# Patient Record
Sex: Male | Born: 1975 | Race: White | Hispanic: No | Marital: Married | Smoking: Never smoker
Health system: Southern US, Community
[De-identification: ages and names within clinical notes are randomized; demographics above are authoritative.]

## PROBLEM LIST (undated history)

## (undated) DIAGNOSIS — B370 Candidal stomatitis: Secondary | ICD-10-CM

## (undated) DIAGNOSIS — M47819 Spondylosis without myelopathy or radiculopathy, site unspecified: Secondary | ICD-10-CM

## (undated) DIAGNOSIS — H8110 Benign paroxysmal vertigo, unspecified ear: Secondary | ICD-10-CM

## (undated) DIAGNOSIS — M549 Dorsalgia, unspecified: Secondary | ICD-10-CM

## (undated) DIAGNOSIS — F418 Other specified anxiety disorders: Secondary | ICD-10-CM

## (undated) DIAGNOSIS — E538 Deficiency of other specified B group vitamins: Secondary | ICD-10-CM

## (undated) DIAGNOSIS — K589 Irritable bowel syndrome without diarrhea: Secondary | ICD-10-CM

## (undated) DIAGNOSIS — E669 Obesity, unspecified: Secondary | ICD-10-CM

## (undated) DIAGNOSIS — I10 Essential (primary) hypertension: Secondary | ICD-10-CM

## (undated) DIAGNOSIS — F1020 Alcohol dependence, uncomplicated: Secondary | ICD-10-CM

## (undated) DIAGNOSIS — F329 Major depressive disorder, single episode, unspecified: Secondary | ICD-10-CM

## (undated) DIAGNOSIS — K219 Gastro-esophageal reflux disease without esophagitis: Secondary | ICD-10-CM

## (undated) DIAGNOSIS — K449 Diaphragmatic hernia without obstruction or gangrene: Secondary | ICD-10-CM

## (undated) DIAGNOSIS — IMO0002 Reserved for concepts with insufficient information to code with codable children: Secondary | ICD-10-CM

## (undated) DIAGNOSIS — G8929 Other chronic pain: Secondary | ICD-10-CM

## (undated) DIAGNOSIS — K2270 Barrett's esophagus without dysplasia: Secondary | ICD-10-CM

## (undated) DIAGNOSIS — R161 Splenomegaly, not elsewhere classified: Secondary | ICD-10-CM

## (undated) DIAGNOSIS — R45851 Suicidal ideations: Secondary | ICD-10-CM

## (undated) DIAGNOSIS — F419 Anxiety disorder, unspecified: Secondary | ICD-10-CM

## (undated) DIAGNOSIS — K760 Fatty (change of) liver, not elsewhere classified: Secondary | ICD-10-CM

## (undated) HISTORY — DX: Benign paroxysmal vertigo, unspecified ear: H81.10

## (undated) HISTORY — DX: Fatty (change of) liver, not elsewhere classified: K76.0

## (undated) HISTORY — DX: Obesity, unspecified: E66.9

## (undated) HISTORY — DX: Other chronic pain: G89.29

## (undated) HISTORY — DX: Dorsalgia, unspecified: M54.9

## (undated) HISTORY — PX: UPPER GASTROINTESTINAL ENDOSCOPY: SHX188

## (undated) HISTORY — PX: TYMPANOSTOMY TUBE PLACEMENT: SHX32

## (undated) HISTORY — DX: Spondylosis without myelopathy or radiculopathy, site unspecified: M47.819

## (undated) HISTORY — DX: Reserved for concepts with insufficient information to code with codable children: IMO0002

## (undated) HISTORY — DX: Suicidal ideations: R45.851

## (undated) HISTORY — DX: Alcohol dependence, uncomplicated: F10.20

## (undated) HISTORY — DX: Major depressive disorder, single episode, unspecified: F32.9

## (undated) HISTORY — DX: Deficiency of other specified B group vitamins: E53.8

## (undated) HISTORY — DX: Barrett's esophagus without dysplasia: K22.70

## (undated) HISTORY — DX: Candidal stomatitis: B37.0

## (undated) HISTORY — DX: Essential (primary) hypertension: I10

## (undated) HISTORY — DX: Splenomegaly, not elsewhere classified: R16.1

## (undated) HISTORY — DX: Diaphragmatic hernia without obstruction or gangrene: K44.9

## (undated) HISTORY — DX: Irritable bowel syndrome, unspecified: K58.9

## (undated) HISTORY — DX: Anxiety disorder, unspecified: F41.9

## (undated) HISTORY — DX: Gastro-esophageal reflux disease without esophagitis: K21.9

---

## 1998-03-28 ENCOUNTER — Emergency Department (HOSPITAL_COMMUNITY): Admission: EM | Admit: 1998-03-28 | Discharge: 1998-03-28 | Payer: Self-pay | Admitting: Emergency Medicine

## 2007-03-25 ENCOUNTER — Emergency Department (HOSPITAL_COMMUNITY): Admission: EM | Admit: 2007-03-25 | Discharge: 2007-03-25 | Payer: Self-pay | Admitting: Emergency Medicine

## 2007-03-28 ENCOUNTER — Ambulatory Visit: Payer: Self-pay | Admitting: Cardiovascular Disease

## 2007-04-10 ENCOUNTER — Ambulatory Visit: Payer: Self-pay

## 2007-04-10 ENCOUNTER — Encounter: Payer: Self-pay | Admitting: Cardiovascular Disease

## 2007-04-12 ENCOUNTER — Ambulatory Visit: Payer: Self-pay | Admitting: Gastroenterology

## 2007-04-12 LAB — CONVERTED CEMR LAB
Amylase: 41 units/L (ref 27–131)
BUN: 9 mg/dL (ref 6–23)
Basophils Relative: 0.6 % (ref 0.0–1.0)
Chloride: 104 meq/L (ref 96–112)
Eosinophils Relative: 5.5 % — ABNORMAL HIGH (ref 0.0–5.0)
GFR calc Af Amer: 147 mL/min
Glucose, Bld: 100 mg/dL — ABNORMAL HIGH (ref 70–99)
Hemoglobin: 15.4 g/dL (ref 13.0–17.0)
Lipase: 22 units/L (ref 11.0–59.0)
Lymphocytes Relative: 27.3 % (ref 12.0–46.0)
MCV: 82.6 fL (ref 78.0–100.0)
Monocytes Absolute: 0.3 10*3/uL (ref 0.2–0.7)
Monocytes Relative: 5.5 % (ref 3.0–11.0)
Neutro Abs: 4 10*3/uL (ref 1.4–7.7)
Neutrophils Relative %: 61.1 % (ref 43.0–77.0)
RBC: 5.74 M/uL (ref 4.22–5.81)
Sodium: 142 meq/L (ref 135–145)
Total Protein: 7.1 g/dL (ref 6.0–8.3)

## 2007-04-16 ENCOUNTER — Ambulatory Visit (HOSPITAL_COMMUNITY): Admission: RE | Admit: 2007-04-16 | Discharge: 2007-04-16 | Payer: Self-pay | Admitting: Gastroenterology

## 2007-04-18 ENCOUNTER — Encounter: Payer: Self-pay | Admitting: Gastroenterology

## 2007-04-18 ENCOUNTER — Ambulatory Visit: Payer: Self-pay | Admitting: Gastroenterology

## 2007-04-18 LAB — CONVERTED CEMR LAB
HCV Ab: NEGATIVE
Hepatitis B Surface Ag: NEGATIVE

## 2007-04-26 ENCOUNTER — Ambulatory Visit: Payer: Self-pay | Admitting: Family Medicine

## 2007-04-26 DIAGNOSIS — I10 Essential (primary) hypertension: Secondary | ICD-10-CM

## 2007-04-26 DIAGNOSIS — F411 Generalized anxiety disorder: Secondary | ICD-10-CM

## 2007-05-03 ENCOUNTER — Ambulatory Visit: Payer: Self-pay | Admitting: Gastroenterology

## 2007-05-03 LAB — CONVERTED CEMR LAB
ALT: 44 units/L (ref 0–53)
AST: 54 units/L — ABNORMAL HIGH (ref 0–37)
Albumin: 3.9 g/dL (ref 3.5–5.2)
Alkaline Phosphatase: 76 units/L (ref 39–117)
Bilirubin, Direct: 0.2 mg/dL (ref 0.0–0.3)
Ferritin: 33.9 ng/mL (ref 22.0–322.0)
Folate: 4 ng/mL
INR: 1 (ref 0.8–1.0)
Iron: 51 ug/dL (ref 42–165)
Prothrombin Time: 12.4 s (ref 10.9–13.3)
Saturation Ratios: 10.6 % — ABNORMAL LOW (ref 20.0–50.0)
Total Bilirubin: 0.9 mg/dL (ref 0.3–1.2)
Total Protein: 7.2 g/dL (ref 6.0–8.3)
Transferrin: 343.3 mg/dL (ref >=212.0)
Vitamin B-12: 247 pg/mL (ref 211–911)

## 2007-05-10 ENCOUNTER — Ambulatory Visit: Payer: Self-pay | Admitting: Family Medicine

## 2007-05-21 ENCOUNTER — Ambulatory Visit: Payer: Self-pay | Admitting: Family Medicine

## 2007-05-21 DIAGNOSIS — H8110 Benign paroxysmal vertigo, unspecified ear: Secondary | ICD-10-CM | POA: Insufficient documentation

## 2007-05-22 ENCOUNTER — Ambulatory Visit: Payer: Self-pay | Admitting: Gastroenterology

## 2007-06-19 ENCOUNTER — Ambulatory Visit: Payer: Self-pay | Admitting: Family Medicine

## 2007-07-02 ENCOUNTER — Ambulatory Visit: Payer: Self-pay | Admitting: Gastroenterology

## 2007-07-02 LAB — CONVERTED CEMR LAB
ALT: 117 units/L — ABNORMAL HIGH (ref 0–53)
Albumin: 3.7 g/dL (ref 3.5–5.2)
Alkaline Phosphatase: 73 units/L (ref 39–117)
Total Protein: 7 g/dL (ref 6.0–8.3)

## 2007-07-03 ENCOUNTER — Ambulatory Visit: Payer: Self-pay | Admitting: Gastroenterology

## 2007-07-03 DIAGNOSIS — K589 Irritable bowel syndrome without diarrhea: Secondary | ICD-10-CM | POA: Insufficient documentation

## 2007-07-03 DIAGNOSIS — R1013 Epigastric pain: Secondary | ICD-10-CM

## 2007-07-03 DIAGNOSIS — K760 Fatty (change of) liver, not elsewhere classified: Secondary | ICD-10-CM

## 2007-07-03 DIAGNOSIS — K219 Gastro-esophageal reflux disease without esophagitis: Secondary | ICD-10-CM

## 2007-07-03 LAB — CONVERTED CEMR LAB
Ferritin: 64 ng/mL (ref 22.0–322.0)
Folate: 9.8 ng/mL
Iron: 101 ug/dL (ref 42–165)
Sed Rate: 17 mm/hr — ABNORMAL HIGH (ref 0–16)

## 2007-07-05 ENCOUNTER — Telehealth: Payer: Self-pay | Admitting: Gastroenterology

## 2007-07-30 ENCOUNTER — Telehealth: Payer: Self-pay | Admitting: Gastroenterology

## 2007-07-31 ENCOUNTER — Ambulatory Visit: Payer: Self-pay | Admitting: Gastroenterology

## 2007-07-31 DIAGNOSIS — E669 Obesity, unspecified: Secondary | ICD-10-CM | POA: Insufficient documentation

## 2007-09-17 ENCOUNTER — Ambulatory Visit: Payer: Self-pay | Admitting: Gastroenterology

## 2007-09-17 LAB — CONVERTED CEMR LAB
ALT: 30 units/L (ref 0–53)
Bilirubin, Direct: 0.1 mg/dL (ref 0.0–0.3)

## 2007-09-18 ENCOUNTER — Ambulatory Visit: Payer: Self-pay | Admitting: Gastroenterology

## 2007-09-18 DIAGNOSIS — F329 Major depressive disorder, single episode, unspecified: Secondary | ICD-10-CM | POA: Insufficient documentation

## 2007-09-18 DIAGNOSIS — F3289 Other specified depressive episodes: Secondary | ICD-10-CM | POA: Insufficient documentation

## 2007-10-05 ENCOUNTER — Ambulatory Visit: Payer: Self-pay | Admitting: Psychology

## 2007-10-09 ENCOUNTER — Emergency Department (HOSPITAL_COMMUNITY): Admission: EM | Admit: 2007-10-09 | Discharge: 2007-10-10 | Payer: Self-pay | Admitting: Emergency Medicine

## 2007-10-25 ENCOUNTER — Ambulatory Visit: Payer: Self-pay | Admitting: Psychology

## 2007-10-30 ENCOUNTER — Ambulatory Visit: Payer: Self-pay | Admitting: Psychology

## 2007-11-01 ENCOUNTER — Ambulatory Visit: Payer: Self-pay | Admitting: Gastroenterology

## 2007-11-06 ENCOUNTER — Ambulatory Visit: Payer: Self-pay | Admitting: Psychology

## 2007-11-06 LAB — CONVERTED CEMR LAB
ALT: 32 units/L (ref 0–53)
Albumin: 4.1 g/dL (ref 3.5–5.2)
Alkaline Phosphatase: 65 units/L (ref 39–117)
Total Protein: 7.4 g/dL (ref 6.0–8.3)

## 2007-11-10 ENCOUNTER — Emergency Department (HOSPITAL_COMMUNITY): Admission: EM | Admit: 2007-11-10 | Discharge: 2007-11-10 | Payer: Self-pay | Admitting: Emergency Medicine

## 2007-11-16 ENCOUNTER — Ambulatory Visit: Payer: Self-pay | Admitting: Psychiatry

## 2007-11-16 ENCOUNTER — Inpatient Hospital Stay (HOSPITAL_COMMUNITY): Admission: AD | Admit: 2007-11-16 | Discharge: 2007-11-19 | Payer: Self-pay | Admitting: Psychiatry

## 2007-11-16 ENCOUNTER — Emergency Department (HOSPITAL_COMMUNITY): Admission: EM | Admit: 2007-11-16 | Discharge: 2007-11-16 | Payer: Self-pay | Admitting: Emergency Medicine

## 2007-11-20 ENCOUNTER — Ambulatory Visit: Payer: Self-pay | Admitting: Psychology

## 2007-11-30 ENCOUNTER — Ambulatory Visit: Payer: Self-pay | Admitting: Psychology

## 2007-12-04 ENCOUNTER — Ambulatory Visit: Payer: Self-pay | Admitting: Psychology

## 2007-12-05 ENCOUNTER — Encounter (INDEPENDENT_AMBULATORY_CARE_PROVIDER_SITE_OTHER): Payer: Self-pay | Admitting: *Deleted

## 2007-12-12 ENCOUNTER — Telehealth: Payer: Self-pay | Admitting: Family Medicine

## 2007-12-12 ENCOUNTER — Ambulatory Visit: Payer: Self-pay | Admitting: Family Medicine

## 2007-12-18 ENCOUNTER — Ambulatory Visit: Payer: Self-pay | Admitting: Licensed Clinical Social Worker

## 2008-01-08 ENCOUNTER — Ambulatory Visit: Payer: Self-pay | Admitting: Gastroenterology

## 2008-01-10 ENCOUNTER — Telehealth: Payer: Self-pay | Admitting: Gastroenterology

## 2008-01-17 ENCOUNTER — Ambulatory Visit: Payer: Self-pay | Admitting: Gastroenterology

## 2008-01-29 ENCOUNTER — Ambulatory Visit: Payer: Self-pay | Admitting: Psychology

## 2008-02-04 ENCOUNTER — Telehealth: Payer: Self-pay | Admitting: Family Medicine

## 2008-02-12 ENCOUNTER — Ambulatory Visit: Payer: Self-pay | Admitting: Psychology

## 2008-02-26 ENCOUNTER — Ambulatory Visit: Payer: Self-pay | Admitting: Psychology

## 2008-03-01 DIAGNOSIS — J45909 Unspecified asthma, uncomplicated: Secondary | ICD-10-CM | POA: Insufficient documentation

## 2008-03-04 ENCOUNTER — Ambulatory Visit: Payer: Self-pay | Admitting: Family Medicine

## 2008-03-05 ENCOUNTER — Telehealth: Payer: Self-pay | Admitting: Gastroenterology

## 2008-03-05 ENCOUNTER — Ambulatory Visit: Payer: Self-pay | Admitting: Gastroenterology

## 2008-03-05 LAB — CONVERTED CEMR LAB
Albumin: 3.8 g/dL (ref 3.5–5.2)
BUN: 13 mg/dL (ref 6–23)
Bilirubin, Direct: 0.1 mg/dL (ref 0.0–0.3)
CO2: 29 meq/L (ref 19–32)
Chloride: 101 meq/L (ref 96–112)
Creatinine, Ser: 0.9 mg/dL (ref 0.4–1.5)
GFR calc non Af Amer: 105 mL/min
Potassium: 4.5 meq/L (ref 3.5–5.1)
Sodium: 140 meq/L (ref 135–145)
Total Bilirubin: 0.8 mg/dL (ref 0.3–1.2)
Total Protein: 6.9 g/dL (ref 6.0–8.3)

## 2008-03-06 ENCOUNTER — Ambulatory Visit: Payer: Self-pay | Admitting: Gastroenterology

## 2008-03-06 DIAGNOSIS — F1011 Alcohol abuse, in remission: Secondary | ICD-10-CM

## 2008-03-11 ENCOUNTER — Ambulatory Visit: Payer: Self-pay | Admitting: Family Medicine

## 2008-03-11 ENCOUNTER — Ambulatory Visit: Payer: Self-pay | Admitting: Psychology

## 2008-03-11 ENCOUNTER — Encounter: Payer: Self-pay | Admitting: Family Medicine

## 2008-03-11 DIAGNOSIS — J45909 Unspecified asthma, uncomplicated: Secondary | ICD-10-CM | POA: Insufficient documentation

## 2008-03-25 ENCOUNTER — Ambulatory Visit: Payer: Self-pay | Admitting: Psychology

## 2008-04-08 ENCOUNTER — Ambulatory Visit: Payer: Self-pay | Admitting: Psychology

## 2008-04-22 ENCOUNTER — Encounter: Payer: Self-pay | Admitting: Gastroenterology

## 2008-04-22 ENCOUNTER — Telehealth: Payer: Self-pay | Admitting: Gastroenterology

## 2008-05-01 ENCOUNTER — Ambulatory Visit: Payer: Self-pay | Admitting: Gastroenterology

## 2008-05-08 ENCOUNTER — Ambulatory Visit: Payer: Self-pay | Admitting: Psychology

## 2008-05-16 ENCOUNTER — Encounter: Payer: Self-pay | Admitting: Gastroenterology

## 2008-05-22 ENCOUNTER — Ambulatory Visit: Payer: Self-pay | Admitting: Psychology

## 2008-06-05 ENCOUNTER — Ambulatory Visit: Payer: Self-pay | Admitting: Psychology

## 2008-06-19 ENCOUNTER — Ambulatory Visit: Payer: Self-pay | Admitting: Psychology

## 2008-07-03 ENCOUNTER — Ambulatory Visit: Payer: Self-pay | Admitting: Psychology

## 2008-07-31 ENCOUNTER — Ambulatory Visit: Payer: Self-pay | Admitting: Psychology

## 2008-08-14 ENCOUNTER — Ambulatory Visit: Payer: Self-pay | Admitting: Psychology

## 2008-08-16 DIAGNOSIS — M5137 Other intervertebral disc degeneration, lumbosacral region: Secondary | ICD-10-CM

## 2008-08-23 ENCOUNTER — Emergency Department (HOSPITAL_COMMUNITY): Admission: EM | Admit: 2008-08-23 | Discharge: 2008-08-23 | Payer: Self-pay | Admitting: Emergency Medicine

## 2008-08-27 ENCOUNTER — Telehealth: Payer: Self-pay | Admitting: Family Medicine

## 2008-08-27 ENCOUNTER — Ambulatory Visit: Payer: Self-pay | Admitting: Family Medicine

## 2008-08-27 ENCOUNTER — Emergency Department (HOSPITAL_COMMUNITY): Admission: EM | Admit: 2008-08-27 | Discharge: 2008-08-27 | Payer: Self-pay | Admitting: Emergency Medicine

## 2008-08-28 ENCOUNTER — Encounter: Admission: RE | Admit: 2008-08-28 | Discharge: 2008-08-28 | Payer: Self-pay | Admitting: Family Medicine

## 2008-09-11 ENCOUNTER — Ambulatory Visit: Payer: Self-pay | Admitting: Psychology

## 2008-10-06 ENCOUNTER — Ambulatory Visit: Payer: Self-pay | Admitting: Gastroenterology

## 2008-10-06 LAB — CONVERTED CEMR LAB
Albumin: 3.7 g/dL (ref 3.5–5.2)
Alkaline Phosphatase: 83 units/L (ref 39–117)
Total Bilirubin: 0.6 mg/dL (ref 0.3–1.2)
Total Protein: 7 g/dL (ref 6.0–8.3)

## 2008-10-07 ENCOUNTER — Ambulatory Visit: Payer: Self-pay | Admitting: Gastroenterology

## 2008-10-09 ENCOUNTER — Ambulatory Visit: Payer: Self-pay | Admitting: Psychology

## 2008-10-23 ENCOUNTER — Ambulatory Visit: Payer: Self-pay | Admitting: Psychology

## 2008-11-20 ENCOUNTER — Ambulatory Visit: Payer: Self-pay | Admitting: Psychology

## 2008-12-04 ENCOUNTER — Ambulatory Visit: Payer: Self-pay | Admitting: Psychology

## 2008-12-08 ENCOUNTER — Ambulatory Visit: Payer: Self-pay | Admitting: Gastroenterology

## 2008-12-08 DIAGNOSIS — IMO0002 Reserved for concepts with insufficient information to code with codable children: Secondary | ICD-10-CM

## 2009-01-01 ENCOUNTER — Ambulatory Visit: Payer: Self-pay | Admitting: Psychology

## 2009-01-13 ENCOUNTER — Telehealth: Payer: Self-pay | Admitting: Gastroenterology

## 2009-01-29 ENCOUNTER — Ambulatory Visit: Payer: Self-pay | Admitting: Psychology

## 2009-02-17 ENCOUNTER — Telehealth (INDEPENDENT_AMBULATORY_CARE_PROVIDER_SITE_OTHER): Payer: Self-pay | Admitting: *Deleted

## 2009-04-23 ENCOUNTER — Ambulatory Visit: Payer: Self-pay | Admitting: Gastroenterology

## 2009-04-24 LAB — CONVERTED CEMR LAB
Albumin: 4.3 g/dL (ref 3.5–5.2)
Bilirubin, Direct: 0.2 mg/dL (ref 0.0–0.3)
Total Bilirubin: 0.8 mg/dL (ref 0.3–1.2)

## 2009-05-07 ENCOUNTER — Emergency Department (HOSPITAL_COMMUNITY): Admission: EM | Admit: 2009-05-07 | Discharge: 2009-05-07 | Payer: Self-pay | Admitting: Emergency Medicine

## 2009-05-07 ENCOUNTER — Telehealth: Payer: Self-pay | Admitting: Family Medicine

## 2009-05-08 ENCOUNTER — Telehealth: Payer: Self-pay | Admitting: Family Medicine

## 2009-05-13 ENCOUNTER — Telehealth: Payer: Self-pay | Admitting: *Deleted

## 2009-05-21 ENCOUNTER — Ambulatory Visit: Payer: Self-pay | Admitting: Psychology

## 2009-05-27 ENCOUNTER — Encounter (INDEPENDENT_AMBULATORY_CARE_PROVIDER_SITE_OTHER): Payer: Self-pay | Admitting: *Deleted

## 2009-06-01 ENCOUNTER — Ambulatory Visit: Payer: Self-pay | Admitting: Psychology

## 2009-07-11 ENCOUNTER — Emergency Department (HOSPITAL_COMMUNITY): Admission: EM | Admit: 2009-07-11 | Discharge: 2009-07-11 | Payer: Self-pay | Admitting: Emergency Medicine

## 2009-08-24 ENCOUNTER — Telehealth (INDEPENDENT_AMBULATORY_CARE_PROVIDER_SITE_OTHER): Payer: Self-pay | Admitting: *Deleted

## 2009-09-11 ENCOUNTER — Ambulatory Visit: Payer: Self-pay | Admitting: Psychology

## 2009-09-15 ENCOUNTER — Ambulatory Visit: Payer: Self-pay | Admitting: Gastroenterology

## 2009-09-15 DIAGNOSIS — R609 Edema, unspecified: Secondary | ICD-10-CM

## 2009-09-16 DIAGNOSIS — E538 Deficiency of other specified B group vitamins: Secondary | ICD-10-CM

## 2009-09-16 DIAGNOSIS — R079 Chest pain, unspecified: Secondary | ICD-10-CM | POA: Insufficient documentation

## 2009-09-16 LAB — CONVERTED CEMR LAB
ALT: 17 units/L (ref 0–53)
AST: 24 units/L (ref 0–37)
Alkaline Phosphatase: 78 units/L (ref 39–117)
BUN: 14 mg/dL (ref 6–23)
Basophils Relative: 0.8 % (ref 0.0–3.0)
Bilirubin, Direct: 0.1 mg/dL (ref 0.0–0.3)
Chloride: 107 meq/L (ref 96–112)
Eosinophils Relative: 2.7 % (ref 0.0–5.0)
Lymphocytes Relative: 36.3 % (ref 12.0–46.0)
MCHC: 33.2 g/dL (ref 30.0–36.0)
Monocytes Absolute: 0.4 10*3/uL (ref 0.1–1.0)
Monocytes Relative: 5.7 % (ref 3.0–12.0)
Neutrophils Relative %: 54.5 % (ref 43.0–77.0)
Potassium: 4.5 meq/L (ref 3.5–5.1)
RBC: 4.83 M/uL (ref 4.22–5.81)
TSH: 4.77 microintl units/mL (ref 0.35–5.50)
Total Bilirubin: 0.3 mg/dL (ref 0.3–1.2)
Total Protein: 7.3 g/dL (ref 6.0–8.3)

## 2009-09-21 ENCOUNTER — Ambulatory Visit: Payer: Self-pay | Admitting: Gastroenterology

## 2009-09-28 ENCOUNTER — Ambulatory Visit: Payer: Self-pay | Admitting: Gastroenterology

## 2009-09-29 DIAGNOSIS — M549 Dorsalgia, unspecified: Secondary | ICD-10-CM | POA: Insufficient documentation

## 2009-09-30 ENCOUNTER — Ambulatory Visit: Payer: Self-pay | Admitting: Cardiovascular Disease

## 2009-10-06 ENCOUNTER — Ambulatory Visit: Payer: Self-pay | Admitting: Gastroenterology

## 2009-10-26 ENCOUNTER — Telehealth: Payer: Self-pay | Admitting: Gastroenterology

## 2009-11-05 ENCOUNTER — Ambulatory Visit: Payer: Self-pay | Admitting: Psychology

## 2009-11-09 ENCOUNTER — Ambulatory Visit: Payer: Self-pay | Admitting: Gastroenterology

## 2009-11-13 ENCOUNTER — Ambulatory Visit: Payer: Self-pay | Admitting: Gastroenterology

## 2009-11-13 LAB — CONVERTED CEMR LAB
Alkaline Phosphatase: 114 units/L (ref 39–117)
BUN: 8 mg/dL (ref 6–23)
Basophils Relative: 0.5 % (ref 0.0–3.0)
Bilirubin, Direct: 0.2 mg/dL (ref 0.0–0.3)
CO2: 29 meq/L (ref 19–32)
Calcium: 9.7 mg/dL (ref 8.4–10.5)
Chloride: 96 meq/L (ref 96–112)
Creatinine, Ser: 0.9 mg/dL (ref 0.4–1.5)
Eosinophils Absolute: 0.2 10*3/uL (ref 0.0–0.7)
Eosinophils Relative: 1.8 % (ref 0.0–5.0)
Folate: 5.2 ng/mL
GFR calc non Af Amer: 99.9 mL/min (ref >60)
Glucose, Bld: 87 mg/dL (ref 70–99)
HCT: 46.7 % (ref 39.0–52.0)
Lymphocytes Relative: 22.2 % (ref 12.0–46.0)
Monocytes Relative: 4.7 % (ref 3.0–12.0)
Neutrophils Relative %: 70.8 % (ref 43.0–77.0)
Platelets: 294 10*3/uL (ref 150.0–400.0)
RDW: 14.7 % — ABNORMAL HIGH (ref 11.5–14.6)
Saturation Ratios: 29.3 % (ref 20.0–50.0)
Sodium: 140 meq/L (ref 135–145)
Vitamin B-12: 498 pg/mL (ref 211–911)
WBC: 10 10*3/uL (ref 4.5–10.5)

## 2009-11-29 ENCOUNTER — Emergency Department (HOSPITAL_COMMUNITY)
Admission: EM | Admit: 2009-11-29 | Discharge: 2009-11-29 | Payer: Self-pay | Source: Home / Self Care | Admitting: Emergency Medicine

## 2010-04-01 NOTE — Assessment & Plan Note (Signed)
Summary: WEEKLY B12 SHOT...LSW.  Nurse Visit   Medication Administration  Injection # 1:    Medication: Vit B12 1000 mcg    Diagnosis: B12 DEFICIENCY (ICD-266.2)    Route: IM    Site: L deltoid    Exp Date: 05/2011    Lot #: [id number]    Mfr: [employer]    Comments: pt will return on 11/03/09 at 11 am for next injection.  Pt prefers injection versus nasal spray.    Patient tolerated injection without complications    Given by: Francee Piccolo CMA Duncan Dull) (October 06, 2009 10:43 AM)  Orders Added: 1)  Vit B12 1000 mcg [J3420]

## 2010-04-01 NOTE — Assessment & Plan Note (Signed)
Summary: WEEKLY B12 SHOT  Nurse Visit   Allergies: 1)  ! Codeine Sulfate (Codeine Sulfate)  Medication Administration  Injection # 1:    Medication: Vit B12 1000 mcg    Diagnosis: B12 DEFICIENCY (ICD-266.2)    Route: IM    Site: R deltoid    Exp Date: 07/2011    Lot #: [id number]    Mfr: [employer]    Comments: pt to schedule # 3 of 3 b 12 at front desk    Patient tolerated injection without complications    Given by: Chales Abrahams CMA Duncan Dull) (September 28, 2009 11:16 AM)  Orders Added: 1)  Vit B12 1000 mcg [J3420]

## 2010-04-01 NOTE — Assessment & Plan Note (Signed)
Summary: F/U APPT...LSW.    History of Present Illness Visit Type: Follow-up Visit Primary GI MD: Sheryn Bison MD Primary Provider: Kelle Darting, MD Requesting Provider: na  Chief Complaint: Follow up, Joseph Valenzuela to cardiologist for chest pain, Done EKG that was normal, but pt states he didnt do any labs which is a concern. Still has worsening chest pain and having more anxiety. pt needs refills on Clonzepam History of Present Illness:   Joseph Valenzuela continues to have severe anxiety related to financial difficulties. He denies current drug or alcohol abuse. He has continued pain in his right foot from a previous fracture this spring with associated edema. This is improved somewhat on Lasix 20 mg every other day. His acid reflux is well controlled Dexilant 60 mg a day. He has noncardiac chest pain related to his obesity, and had recent negative cardiac evaluation by Dr. Charlton Haws. He has severe fatty liver related to his obesity which apparently was initiated by Zyprexa use for his severe chronic anxiety and depression. He previously was being evaluated by Dr. Caralyn Guile, but financial concerns have limited this interaction. His primary care physician is Dr. Kelle Darting. Patient does have well controlled essential hypertension.   GI Review of Systems    Reports chest pain.      Denies abdominal pain, acid reflux, belching, bloating, dysphagia with liquids, dysphagia with solids, heartburn, loss of appetite, nausea, vomiting, vomiting blood, weight loss, and  weight gain.        Denies anal fissure, black tarry stools, change in bowel habit, constipation, diarrhea, diverticulosis, fecal incontinence, heme positive stool, hemorrhoids, irritable bowel syndrome, jaundice, light color stool, liver problems, rectal bleeding, and  rectal pain.    Current Medications (verified): 1)  Clonazepam 1 Mg  Tabs (Clonazepam) .Marland Kitchen.. 1 Tab Tid 2)  Dexilant 60 Mg Cpdr (Dexlansoprazole) .... Take One By Mouth Once  Daily 3)  Lisinopril-Hydrochlorothiazide 20-12.5 Mg Tabs (Lisinopril-Hydrochlorothiazide) .Marland Kitchen.. 1 Tab By Mouth Once Daily 4)  Furosemide 20 Mg Tabs (Furosemide) .Marland Kitchen.. 1 By Mouth Every Other Day 5)  Cyanocobalamin 1000 Mcg/ml Inj Soln (Cyanocobalamin) .Marland Kitchen.. 1 Cc Im Weekly X3 Then Monthly  Allergies (verified): 1)  ! 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  Regular exercise-no Patient has never smoked cigerettes  Past medical, surgical, family and social histories (including risk factors) reviewed for relevance to current acute and chronic problems.  Past Medical History: Reviewed history from 09/29/2009 and no changes required. CHEST PAIN UNSPECIFIED  normal dobutamine echo in 2009  Had SSCP with GI overtones HYPERTENSION  SUICIDAL IDEATION BACK PAIN, CHRONIC B12 DEFICIENCY  EDEMA HERNIATED DISC  DISC DISEASE, LUMBAR ASTHMA ALCOHOLISM  EXTRINSIC ASTHMA, UNSPECIFIED  VIRAL URI  SORE THROAT  DEPRESSION  OBESITY FATTY LIVER DISEASE  ABDOMINAL PAIN-EPIGASTRIC  GERD  IRRITABLE BOWEL SYNDROME  BENIGN POSITIONAL VERTIGO FAMILY HISTORY DEPRESSION FAMILY HISTORY OF ALCOHOLISM/ADDICTION  ANXIETY degenerative spinal disease  Past Surgical History: Reviewed history from 09/18/2007 and no changes required. Unremarkable  Family History: Reviewed history from 10/07/2008 and no changes required. Family History of Alcoholism/Addiction Family History Depression Family  History Hypertension Family History of Diabetes: Maternal Aunt, Mother Family History of Stomach Cancer: Grandmother Brain Cancer: Grandfather No FH of Colon Cancer:  Social History: Reviewed history from 09/29/2009 and no changes required. 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  Regular exercise-no Patient has never smoked cigerettes  Review of Systems       The patient complains of arthritis/joint pain, depression-new, shortness of breath, and sleeping problems.  The patient denies allergy/sinus, anemia, back pain, blood in urine, breast changes/lumps, change in vision, confusion, cough, coughing up blood, fainting, fatigue, fever, headaches-new, hearing problems, heart murmur, heart rhythm changes, itching, muscle pains/cramps, night sweats, nosebleeds, skin rash, sore throat, swelling of feet/legs, swollen lymph glands, thirst - excessive, urination - excessive, urination changes/pain, urine leakage, vision changes, and voice change.    Vital Signs:  Patient profile:   35 year old male Height:      70 inches Weight:      363 pounds BMI:     52.27 BSA:     2.69 Pulse rate:   100 / minute BP sitting:   130 / 88  (left arm)  Vitals Entered By: Merri Ray CMA Duncan Dull) (November 13, 2009 10:50 AM)  Physical Exam  General:  Well developed, well nourished, no acute distress.Morbidly obese with multiple tattoos obvious. Head:  Normocephalic and atraumatic. Eyes:  PERRLA, no icterus.exam deferred to patient's ophthalmologist.   Lungs:  Clear throughout to auscultation. Heart:  Regular rate and rhythm; no murmurs, rubs,  or bruits. Abdomen:  Soft, nontender and nondistended. No masses, hepatosplenomegaly or hernias noted. Normal bowel sounds.obese.  No significant organomegaly or ascites noted. Extremities:  No clubbing, cyanosis, edema or  deformities noted.There is trace pretibial edema, right ankle greater than left but no evidence of phlebitis. Neurologic:  Alert and  oriented x4;  grossly normal neurologically. Psych:  Alert and cooperative. Normal mood and affect.depressed affect.     Impression & Recommendations:  Problem # 1:  CHEST PAIN UNSPECIFIED (ICD-786.50) Assessment Unchanged Continue reflux maneuvers and daily Dexilant 60 mg through patient assistance program  Problem # 2:  HYPERTENSION (ICD-401.9) Assessment: Improved blood pressure today is 130/80, and he is to continue all other medications per Dr. Tawanna Cooler  Problem # 3:  EDEMA (ICD-782.3) Assessment: Improved we will check labs today and continue Lasix every other day as needed. The patient has not had orthopedic evaluation which may be needed for her continued problems with his right ankle.  Problem # 4:  ALCOHOLISM (ICD-303.90) Assessment: Improved  Problem # 5:  DEPRESSION (ICD-311) Assessment: Deteriorated The Patient cannot afford psychiatric referral, as not seen Dr. Dellia Cloud recently, and has refused any suggestions of antidepressant medication. I do not think he is suicidal at this time  Problem # 6:  OBESITY (ICD-278.00) Assessment: Deteriorated He Is not, not, a valid candidate for bariatric surgery.  Problem # 7:  FATTY LIVER DISEASE (ICD-571.8) Assessment: Unchanged Repeat labs and liver function testing consider initiation of metformin therapy. He previously was on Actos which was discontinued per black box warning. Orders: TLB-CBC Platelet - w/Differential (85025-CBCD) TLB-BMP (Basic Metabolic Panel-BMET) (80048-METABOL) TLB-Hepatic/Liver Function Pnl (80076-HEPATIC) TLB-TSH (Thyroid Stimulating Hormone) (84443-TSH) TLB-B12, Serum-Total ONLY (16109-U04) TLB-Ferritin (82728-FER) TLB-Folic Acid (Folate) (82746-FOL) TLB-IBC Pnl (Iron/FE;Transferrin) (83550-IBC)  Patient Instructions: 1)  Copy sent to : Kelle Darting, MD, Charlton Haws, MD 2)  Please go to the  basement today for your labs.  3)  Your prescriptions have been sent to you pharmacy.  4)  The medication list was reviewed and reconciled.  All changed / newly prescribed medications were explained.  A complete medication list was provided to the patient / caregiver. 5)  Please continue current medications.  6)  Avoid foods high in acid content ( tomatoes, citrus juices, spicy foods) . Avoid eating within 3 to 4 hours of lying down or before exercising. Do not over eat; try smaller more frequent meals. Elevate head of bed four inches when sleeping.  7)  Fatty Liver handout given.  Prescriptions: CLONAZEPAM 1 MG  TABS (CLONAZEPAM) take one by mouth three times a day  #90 x 5   Entered by:   Harlow Mares CMA (AAMA)   Authorized by:   Mardella Layman MD [facility]   Signed by:   Harlow Mares CMA (AAMA) on 11/13/2009   Method used:   Printed then faxed to ...       [facility] [address] [phone]* (retail)       [address]       [place], [place]  [address]       Ph: [phone] or [phone]       Fax: [phone]   RxID:   [id number]

## 2010-04-01 NOTE — Progress Notes (Signed)
Summary: Pt req pain med for ruptured disk.   Call back at [facility] Phone [phone] Call back at [phone] [address]   Caller: spouse-Trisha [place] Summary of Call: Pt has re-injured his back. It is a ruptured disk and pt is in alot of pain.Pt is suppose to be going to [facility] as soon as there is an appt available. Pt is req a few pain meds to [facility]. Pt can not take anything with Codene in it. Pt is allergic. Pt was offered and ov to come in to see Dr Tawanna Cooler on Friday 05/08/09, but pt wanted to see if Dr. Tawanna Cooler would call something in.  Initial call taken by: Lucy Antigua,  May 07, 2009 3:45 PM  Follow-up for Phone Call        please call...........can he tolerate hydrocodone???????????? Follow-up by: Roderick Pee MD,  May 07, 2009 4:31 PM    Additional Follow-up for Phone Call Additional follow up Details #2::    I called Julias..... he's been having this severe pain for the past two days.  His pain now on a scale of one to 10 is a 10 he's miserable he called and Dr. Doreen Beam office.  His neurosurgeon they were not able to see them.  Because of the severe pain.  He went to [facility] room they gave him IV steroids, Ativan, and hydromorphone, however, his pain is still a 10.  He has no urinary difficulty.  Advised to go to the hospital be admitted for pain control and have the neurosurgeon see him why he is in the hospital for further evaluation Follow-up by: Roderick Pee MD,  May 07, 2009 5:03 PM

## 2010-04-01 NOTE — Assessment & Plan Note (Signed)
Summary: medication refill--ch.    History of Present Illness Visit Type: Follow-up Visit Primary GI MD: Sheryn Bison MD Primary Provider: Kelle Darting, MD Chief Complaint: Patient here to discuss Actos because its on the FDA black list. He does need a refill on his  Clonazepam. History of Present Illness:   Joseph Valenzuela is doing well from a clinical standpoint has been able to lose some weight with effort. He denies abdominal pain, reflux symptoms, nausea vomiting, and his constipation is alleviated by p.r.n. MiraLax. 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 He continues with chronic anxiety syndrome managed with Clonazepam 1 mg 3 times a day. 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 His GERD is managed by daily PPI therapy. He has not seen Dr. Dellia Cloud because of financial concerns.    GI Review of Systems      Denies abdominal pain, acid reflux, belching, bloating, chest pain, dysphagia with liquids, dysphagia with solids, heartburn, loss of appetite, nausea, vomiting, vomiting blood, weight loss, and  weight gain.        Denies anal fissure, black tarry stools, change in bowel habit, constipation, diarrhea, diverticulosis, fecal incontinence, heme positive stool, hemorrhoids, irritable bowel syndrome, jaundice, light color stool, liver problems, rectal bleeding, and  rectal pain.    Current Medications (verified): 1)  Clonazepam 1 Mg  Tabs (Clonazepam) .Marland Kitchen.. 1 Tab Tid 2)  Adult Aspirin Ec Low Strength 81 Mg  Tbec (Aspirin) .... Once Daily 3)  Actos 45 Mg  Tabs (Pioglitazone Hcl) .... Take One in Am Daily 4)  Librax 2.5-5 Mg  Caps (Clidinium-Chlordiazepoxide) .... Take One Tab Every Six Hours As Needed 5)  Dexilant 60 Mg Cpdr (Dexlansoprazole) .... Take One By Mouth Once Daily 6)  Lisinopril-Hydrochlorothiazide 10-12.5 Mg  Tabs  (Lisinopril-Hydrochlorothiazide) .... Take 1 Tablet By Mouth Once A Day 7)  Miralax   Powd (Polyethylene Glycol 3350) .... Take One Packet in H2o Once A Day As Needed 8)  Flexeril 10 Mg Tabs (Cyclobenzaprine Hcl) .... Take One By Mouth As Needed  Allergies (verified): 1)  ! Codeine Sulfate (Codeine Sulfate)  Past History:  Past medical, surgical, family and social histories (including risk factors) reviewed for relevance to current acute and chronic problems.  Past Medical History: Anxiety Hypertension depression alcoholism drug abuse esophagitis Current Problems:  DEPRESSION (ICD-311) OBESITY (ICD-278.00) FATTY LIVER DISEASE (ICD-571.8) ABDOMINAL PAIN-EPIGASTRIC (ICD-789.06) GERD (ICD-530.81) IRRITABLE BOWEL SYNDROME (ICD-564.1) BENIGN POSITIONAL VERTIGO (ICD-386.11) FAMILY HISTORY DEPRESSION (ICD-V17.0) FAMILY HISTORY OF ALCOHOLISM/ADDICTION (ICD-V61.41) HYPERTENSION (ICD-401.9) ANXIETY (ICD-300.00) Asthma degenerative spinal disease  Past Surgical History: Reviewed history from 09/18/2007 and no changes required. Unremarkable  Family History: Reviewed history from 10/07/2008 and no changes required. Family History of Alcoholism/Addiction Family History Depression Family History Hypertension Family History of Diabetes: Maternal Aunt, Mother Family History of Stomach Cancer: Grandmother Brain Cancer: Grandfather No FH of Colon Cancer:  Social History: Reviewed history from 10/07/2008 and no changes required. 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  The patient denies  allergy/sinus, anemia, anxiety-new, blood  in urine, breast changes/lumps, change in vision, confusion, coughing up blood, fainting, fever, hearing problems, heart murmur, heart rhythm changes, itching, menstrual pain, night sweats, nosebleeds, pregnancy symptoms, skin rash, sore throat, swelling of feet/legs, swollen lymph glands, thirst - excessive , urination - excessive , urination changes/pain, urine leakage, vision changes, and voice change.    Vital Signs:  Patient profile:   35 year old male Height:      70 inches Weight:      352.0 pounds BMI:     50.69 Pulse rate:   80 / minute Pulse rhythm:   regular BP sitting:   128 / 80  (right arm) Cuff size:   large  Vitals Entered By: Harlow Mares CMA Duncan Dull) (April 23, 2009 11:17 AM)  Physical Exam  General:  Well developed, well nourished, no acute distress.obese.   Head:  Normocephalic and atraumatic. Eyes:  PERRLA, no icterus.exam deferred to patient's ophthalmologist.   Abdomen:  Soft, nontender and nondistended. No masses, hepatosplenomegaly or hernias noted. Normal bowel sounds.obese.   Extremities:  No clubbing, cyanosis, edema or deformities noted. Neurologic:  Alert and  oriented x4;  grossly normal neurologically. Psych:  Alert and cooperative. Normal mood and affect.   Impression & Recommendations:  Problem # 1:  FATTY LIVER DISEASE (ICD-571.8) Assessment Improved Continue current meds, gradual weight loss, and repeat liver enzymes today with every 3 month GI followup or p.r.n. as needed I have encouraged him as to his weight loss and again have urged him to Adopt a gradual graded aerobic exercise program. Again, I have urged him to abstain from alcohol with this history of alcoholism and all medications not prescribed by physicians. Orders: TLB-Hepatic/Liver Function Pnl (80076-HEPATIC)  Problem # 2:  OBESITY (ICD-278.00) Assessment: Improved  Problem # 3:  ABDOMINAL PAIN-EPIGASTRIC (ICD-789.06) Assessment:  Improved Continue daily PPI therapy and p.r.n. MiraLax  Problem # 4:  ALCOHOLISM (ICD-303.90) Assessment: Improved Continue AA meetings as recommended.  Problem # 5:  EXTRINSIC ASTHMA, UNSPECIFIED (ICD-493.00) Assessment: Improved  Problem # 6:  DEPRESSION (ICD-311) Assessment: Improved He Does not desire antidepressant medication at this time. He denies severe depression or any psychotic symptomatology, hallucinations etc. I have renewed his anti-anxiety medication as mentioned above.  Problem # 7:  IRRITABLE BOWEL SYNDROME (ICD-564.1) Assessment: Improved  Problem # 8:  HYPERTENSION (ICD-401.9) Assessment: Improved blood pressure today is normal at 128/80 he is to continue his antihypertensive medication.  Problem # 9:  HERNIATED DISC (ICD-722.2) Assessment: Unchanged Continue orthopedic evaluation as scheduled. He probably can't tolerate NSAIDs as long as he is on PPI prophylaxis.  Patient Instructions: 1)  Copy sent to : Dr. Kelle Darting 2)  Please continue current medications.  3)  Labs pending. 4)  The medication list was reviewed and reconciled.  All changed / newly prescribed medications were explained.  A complete medication list was provided to the patient / caregiver. Prescriptions: CLONAZEPAM 1 MG  TABS (CLONAZEPAM) 1 tab tid  #90 x 2   Entered by:   Harlow Mares CMA (AAMA)   Authorized by:   Mardella Layman MD [facility]   Signed by:   Harlow Mares CMA (AAMA) on 04/23/2009   Method used:   Printed then faxed to ...       [facility] [phone]* (retail)       [address]       [place], [place]  [address]  Ph: [phone] or [phone]       Fax: [phone]   RxID:   [id number]

## 2010-04-01 NOTE — Assessment & Plan Note (Signed)
Summary: DISCUSS ACTOS SIDE EFFECTS...AS.    History of Present Illness Visit Type: Follow-up Visit Primary GI MD: Sheryn Bison MD Primary Provider: Kelle Darting, MD Requesting Provider: na  Chief Complaint: Pt has question about Actos. Pt c/o chest pain and new anxiety due to taking Actos History of Present Illness:   Joseph Valenzuela is fairly stable with his chronic anxiety and depression being followed by Dr. Dellia Cloud and myself. He is currently on clonazepam 1 mg 3 times a day. Per his massive obesity and fatty liver, he has been on Actos 45 mg a day but is having problems with peripheral edema and vague chest pain and desires cardiac followup with Dr. Charlton Haws in cardiology. Additional medications include Exelon 60 mg a day for acid reflux and p.r.n. Librax. He also takes every other day MiraLax and a daily aspirin.  He denies psychotic symptomatology or suicidal thoughts. His anxiety continues to worsen with unemployment and various social problems but he denies active alcohol or drug abuse.   GI Review of Systems    Reports chest pain.      Denies abdominal pain, acid reflux, belching, bloating, dysphagia with liquids, dysphagia with solids, heartburn, loss of appetite, nausea, vomiting, vomiting blood, weight loss, and  weight gain.        Denies anal fissure, black tarry stools, change in bowel habit, constipation, diarrhea, diverticulosis, fecal incontinence, heme positive stool, hemorrhoids, irritable bowel syndrome, jaundice, light color stool, liver problems, rectal bleeding, and  rectal pain.    Current Medications (verified): 1)  Clonazepam 1 Mg  Tabs (Clonazepam) .Marland Kitchen.. 1 Tab Tid 2)  Adult Aspirin Ec Low Strength 81 Mg  Tbec (Aspirin) .... Once Daily 3)  Actos 45 Mg  Tabs (Pioglitazone Hcl) .... Take One in Am Daily 4)  Librax 2.5-5 Mg  Caps (Clidinium-Chlordiazepoxide) .... Take One Tab Every Six Hours As Needed 5)  Dexilant 60 Mg Cpdr (Dexlansoprazole) .... Take One By  Mouth Once Daily 6)  Lisinopril-Hydrochlorothiazide 10-12.5 Mg  Tabs (Lisinopril-Hydrochlorothiazide) .... Take 1 Tablet By Mouth Once A Day 7)  Miralax   Powd (Polyethylene Glycol 3350) .... Take One Packet in H2o Once A Day As Needed  Allergies (verified): 1)  ! Codeine Sulfate (Codeine Sulfate)  Past History:  Past medical, surgical, family and social histories (including risk factors) reviewed for relevance to current acute and chronic problems.  Past Medical History: Reviewed history from 04/23/2009 and no changes required. Anxiety Hypertension depression alcoholism drug abuse esophagitis Current Problems:  DEPRESSION (ICD-311) OBESITY (ICD-278.00) FATTY LIVER DISEASE (ICD-571.8) ABDOMINAL PAIN-EPIGASTRIC (ICD-789.06) GERD (ICD-530.81) IRRITABLE BOWEL SYNDROME (ICD-564.1) BENIGN POSITIONAL VERTIGO (ICD-386.11) FAMILY HISTORY DEPRESSION (ICD-V17.0) FAMILY HISTORY OF ALCOHOLISM/ADDICTION (ICD-V61.41) HYPERTENSION (ICD-401.9) ANXIETY (ICD-300.00) Asthma degenerative spinal disease  Past Surgical History: Reviewed history from 09/18/2007 and no changes required. Unremarkable  Family History: Reviewed history from 10/07/2008 and no changes required. Family History of Alcoholism/Addiction Family History Depression Family History Hypertension Family History of Diabetes: Maternal Aunt, Mother Family History of Stomach Cancer: Grandmother Brain Cancer: Grandfather No FH of Colon Cancer:  Social History: Reviewed history from 10/07/2008 and no changes required. 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  The patient denies allergy/sinus, anemia, arthritis/joint pain, back pain, blood in urine, breast changes/lumps, change in vision,  confusion, cough,  coughing up blood, depression-new, fainting, fatigue, fever, headaches-new, hearing problems, heart murmur, heart rhythm changes, itching, muscle pains/cramps, night sweats, nosebleeds, shortness of breath, skin rash, sleeping problems, sore throat, swelling of feet/legs, swollen lymph glands, thirst - excessive, urination - excessive, urination changes/pain, urine leakage, vision changes, and voice change.    Vital Signs:  Patient profile:   35 year old male Height:      70 inches Weight:      374 pounds BMI:     53.86 BSA:     2.73 Pulse rate:   88 / minute Pulse rhythm:   regular BP sitting:   136 / 84  (left arm) Cuff size:   large  Vitals Entered By: Ok Anis CMA (September 15, 2009 11:11 AM)  Physical Exam  General:  Well developed, well nourished, no acute distress.obese.   Head:  Normocephalic and atraumatic. Eyes:  PERRLA, no icterus.exam deferred to patient's ophthalmologist.   Lungs:  Clear throughout to auscultation. Heart:  Regular rate and rhythm; no murmurs, rubs,  or bruits. Abdomen:  Soft, nontender and nondistended. No masses, hepatosplenomegaly or hernias noted. Normal bowel sounds.obese.   Msk:  Symmetrical with no gross deformities. Normal posture. Extremities:  1+ pedal edema.   Neurologic:  Alert and  oriented x4;  grossly normal neurologically. Psych:  Alert and cooperative. Normal mood and affect.depressed affect.     Impression & Recommendations:  Problem # 1:  EDEMA (ICD-782.3) Assessment Deteriorated Stop Actos, continued lisinopril-HCTZ -- 12.5 mg a day, add Lasix 20 mg every other day for 2 weeks, and also scheduled cardiac followup with Dr. Eden Emms for his history of possible nonischemic cardiomyopathy. Review of his record shows normalization of his liver function tests previously with Actos. We will follow his liver function and if they worsen, we will try metformin therapy. Repeat labs have been ordered. TLB-CBC Platelet -  w/Differential (85025-CBCD) TLB-BMP (Basic Metabolic Panel-BMET) (80048-METABOL) TLB-Hepatic/Liver Function Pnl (80076-HEPATIC) TLB-TSH (Thyroid Stimulating Hormone) (84443-TSH) TLB-B12, Serum-Total ONLY (27253-G64) TLB-Ferritin (82728-FER) TLB-Folic Acid (Folate) (82746-FOL) TLB-IBC Pnl (Iron/FE;Transferrin) (83550-IBC)  Problem # 2:  OBESITY (ICD-278.00) Assessment: Deteriorated  Problem # 3:  ABDOMINAL PAIN-EPIGASTRIC (ICD-789.06) Assessment: Improved Continue Dexilant 60 mg a day and p.r.n. Librax.  Problem # 4:  HYPERTENSION (ICD-401.9) Assessment: Improved blood pressure today 136/84.  Problem # 5:  ANXIETY (ICD-300.00) Assessment: Deteriorated Continue followup with Dr. Dellia Cloud as planned with continued anti-anxiety medication as needed and tolerated.  Patient Instructions: 1)  Please go to the basement for lab work. 2)  Begin lasix every other day. 3)  Stop Actos. 4)  Please make an appt with Dr. Eden Emms. 5)  The medication list was reviewed and reconciled.  All changed / newly prescribed medications were explained.  A complete medication list was provided to the patient / caregiver. 6)  Copy sent to : Dr. Charlton Haws, Dr. Caralyn Guile, and Dr. Kelle Darting 7)  Please schedule a follow-up appointment in 1 month.  Prescriptions: CLONAZEPAM 1 MG  TABS (CLONAZEPAM) 1 tab tid  #90 x 0   Entered by:   Ashok Cordia RN   Authorized by:   Mardella Layman MD [facility]   Signed by:   Ashok Cordia RN on 09/15/2009   Method used:   Printed then faxed to ...       [facility] [address] [phone]* (retail)       [address]       [place]  [place], [place]  [address]       Ph: [phone] or [phone]       Fax: [phone]   RxID:   [id number] FUROSEMIDE 20 MG TABS (FUROSEMIDE) 1 by mouth every other day  #30 x 3   Entered by:   Ashok Cordia RN   Authorized by:   Mardella Layman MD [facility]   Signed by:   Ashok Cordia RN on 09/15/2009   Method used:    [facility]ronically [facility]        [facility] [address] [phone]* (retail)       [address]       [place], [place]  [address]       Ph: [phone] or [phone]       Fax: [phone]   RxID:   [id number]

## 2010-04-01 NOTE — Progress Notes (Signed)
Summary: Out of work Note  Phone Note Call from Patient   Caller: Patient Call For: Roderick Pee MD Summary of Call: Pt's employer has threatened to fire him if he does not get a OOW note.  Needs a note from 05/12/2009 and return 05/17/2009. Diagnosis: Back Pain. [phone] Initial call taken by: Lynann Beaver CMA,  May 13, 2009 11:08 AM  Follow-up for Phone Call        [place].......... please call.......... A. note needs to be generated by the physician that he saw that would be his neurosurgeon Follow-up by: Roderick Pee MD,  May 13, 2009 11:30 AM  Additional Follow-up for Phone Call Additional follow up Details #1::        patient was unable to see dr elsner because he was a patient of dr hirsch.  he will not be able to see dr hirsch for 2 weeks.  he would like to know if he can get a note for work until is appointment with dr hirsch because he is in a lot of pain.    Additional Follow-up for Phone Call Additional follow up Details #2::    have patient call the office manager to see if he can be seen sooner.  spoke with patient  Follow-up by: Kern Reap CMA Duncan Dull),  May 13, 2009 11:44 AM

## 2010-04-01 NOTE — Assessment & Plan Note (Signed)
Summary: #1 of 3 weekly B12 inj/266.2/dfs  Nurse Visit   Allergies: 1)  ! Codeine Sulfate (Codeine Sulfate)  Medication Administration  Injection # 1:    Medication: Vit B12 1000 mcg    Diagnosis: B12 DEFICIENCY (ICD-266.2)    Route: IM    Site: L deltoid    Exp Date: 05/012013    Lot #: [id number]    Mfr: [employer]    Patient tolerated injection without complications    Given by: Harlow Mares CMA (AAMA) (September 21, 2009 9:22 AM)

## 2010-04-01 NOTE — Assessment & Plan Note (Signed)
Summary: MONTHLY B12 INJECTION/266.2//SP  Nurse Visit   Allergies: 1)  ! Codeine Sulfate (Codeine Sulfate)  Medication Administration  Injection # 1:    Medication: Vit B12 1000 mcg    Diagnosis: B12 DEFICIENCY (ICD-266.2)    Route: IM    Site: R deltoid    Exp Date: 05/2011    Lot #: [id number]    Mfr: [employer]    Comments: pt to schedule  next monthly b12 at front desk    Patient tolerated injection without complications    Given by: Chales Abrahams CMA Duncan Dull) (November 09, 2009 9:50 AM)  Orders Added: 1)  Vit B12 1000 mcg [J3420]

## 2010-04-01 NOTE — Assessment & Plan Note (Signed)
Summary: f2y  Medications Added LISINOPRIL-HYDROCHLOROTHIAZIDE 20-12.5 MG TABS (LISINOPRIL-HYDROCHLOROTHIAZIDE) 1 tab by mouth once daily      Allergies Added:   Referring Provider:  na Primary Provider:  Kelle Darting, MD  CC:  pressure in chest.  History of Present Illness: Joseph Valenzuela is seen today for atypical SSCP.  He continues to have depression and anxiety.  He is unemployed, has tax problems and a brother in [place].  Pain is associated with anxiety.  Nonexertional sharp can last minutes.  No pleuritic component.  Continues to be morbidly obese.  Needs dietary consult.  Had a normal Dobutamine echo in 2010.  Wants to avoid this test if possible But noninvasive imaging options are limited due to his weight  Will be stopping Actos due to excessive edema and weight gain.  Would agree with this as metformin is a better drug  Current Problems (verified): 1)  Chest Pain Unspecified  (ICD-786.50) 2)  Hypertension  (ICD-401.9) 3)  Suicidal Ideation  (ICD-V62.84) 4)  Back Pain, Chronic  (ICD-724.5) 5)  B12 Deficiency  (ICD-266.2) 6)  Edema  (ICD-782.3) 7)  Herniated Disc  (ICD-722.2) 8)  Disc Disease, Lumbar  (ICD-722.52) 9)  Asthma  (ICD-493.90) 10)  Alcoholism  (ICD-303.90) 11)  Extrinsic Asthma, Unspecified  (ICD-493.00) 12)  Viral Uri  (ICD-465.9) 13)  Sore Throat  (ICD-462) 14)  Depression  (ICD-311) 15)  Obesity  (ICD-278.00) 16)  Fatty Liver Disease  (ICD-571.8) 17)  Abdominal Pain-epigastric  (ICD-789.06) 18)  Gerd  (ICD-530.81) 19)  Irritable Bowel Syndrome  (ICD-564.1) 20)  Benign Positional Vertigo  (ICD-386.11) 21)  Family History Depression  (ICD-V17.0) 22)  Family History of Alcoholism/addiction  (ICD-V61.41) 23)  Anxiety  (ICD-300.00)  Current Medications (verified): 1)  Clonazepam 1 Mg  Tabs (Clonazepam) .Marland Kitchen.. 1 Tab Tid 2)  Dexilant 60 Mg Cpdr (Dexlansoprazole) .... Take One By Mouth Once Daily 3)  Lisinopril-Hydrochlorothiazide 20-12.5 Mg Tabs  (Lisinopril-Hydrochlorothiazide) .Marland Kitchen.. 1 Tab By Mouth Once Daily 4)  Furosemide 20 Mg Tabs (Furosemide) .Marland Kitchen.. 1 By Mouth Every Other Day 5)  Cyanocobalamin 1000 Mcg/ml Inj Soln (Cyanocobalamin) .Marland Kitchen.. 1 Cc Im Weekly X3 Then Monthly  Allergies (verified): 1)  ! Codeine Sulfate (Codeine Sulfate)  Past History:  Past Medical History: Last updated: 09/29/2009 CHEST PAIN UNSPECIFIED  normal dobutamine echo in 2009  Had SSCP with GI overtones HYPERTENSION  SUICIDAL IDEATION BACK PAIN, CHRONIC B12 DEFICIENCY  EDEMA HERNIATED DISC  DISC DISEASE, LUMBAR ASTHMA ALCOHOLISM  EXTRINSIC ASTHMA, UNSPECIFIED  VIRAL URI  SORE THROAT  DEPRESSION  OBESITY FATTY LIVER DISEASE  ABDOMINAL PAIN-EPIGASTRIC  GERD  IRRITABLE BOWEL SYNDROME  BENIGN POSITIONAL VERTIGO FAMILY HISTORY DEPRESSION FAMILY HISTORY OF ALCOHOLISM/ADDICTION  ANXIETY degenerative spinal disease  Past Surgical History: Last updated: 09/18/2007 Unremarkable  Family History: Last updated: 10/07/2008 Family History of Alcoholism/Addiction Family History Depression Family History Hypertension Family History of Diabetes: Maternal Aunt, Mother Family History of Stomach Cancer: Grandmother Brain Cancer: Grandfather No FH of Colon Cancer:  Social History: Last updated: 09/29/2009 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  Regular exercise-no Patient has never smoked cigerettes  Review of Systems       Denies fever, malais, weight loss, blurry vision, decreased visual acuity, cough, sputum, SOB, hemoptysis, pleuritic pain, palpitaitons, heartburn, abdominal pain, melena,  claudication, or rash.   Vital Signs:  Patient profile:   35 year old male Height:  70 inches Weight:      363 pounds BMI:     52.27 Pulse rate:   81 / minute Resp:     14 per minute BP sitting:   159 /  105  (left arm) Cuff size:   large  Vitals Entered By: Kem Parkinson (September 30, 2009 8:48 AM)  Physical Exam  General:  Affect appropriate Healthy:  appears stated age HEENT: normal Neck supple with no adenopathy JVP normal no bruits no thyromegaly Lungs clear with no wheezing and good diaphragmatic motion Heart:  S1/S2 no murmur,rub, gallop or click PMI normal Abdomen: benighn, BS positve, no tenderness, no AAA no bruit.  No HSM or HJR Distal pulses intact with no bruits Plus one bilateral edema Neuro non-focal Skin warm and dry Multiple tatoos    Impression & Recommendations:  Problem # 1:  CHEST PAIN UNSPECIFIED (ICD-786.50)  Atypical with normal dobutamine echo 2010  No need for further testing at this time. Patient does not want to have another dobutamine echo "unless absolutely necessary" The following medications were removed from the medication list:    Adult Aspirin Ec Low Strength 81 Mg Tbec (Aspirin) ..... Once daily His updated medication list for this problem includes:    Lisinopril-hydrochlorothiazide 20-12.5 Mg Tabs (Lisinopril-hydrochlorothiazide) .Marland Kitchen... 1 tab by mouth once daily  The following medications were removed from the medication list:    Adult Aspirin Ec Low Strength 81 Mg Tbec (Aspirin) ..... Once daily His updated medication list for this problem includes:    Lisinopril-hydrochlorothiazide 20-12.5 Mg Tabs (Lisinopril-hydrochlorothiazide) .Marland Kitchen... 1 tab by mouth once daily  Orders: EKG w/ Interpretation (93000)  Problem # 2:  HYPERTENSION (ICD-401.9)  Well controlled The following medications were removed from the medication list:    Adult Aspirin Ec Low Strength 81 Mg Tbec (Aspirin) ..... Once daily His updated medication list for this problem includes:    Lisinopril-hydrochlorothiazide 20-12.5 Mg Tabs (Lisinopril-hydrochlorothiazide) .Marland Kitchen... 1 tab by mouth once daily    Furosemide 20 Mg Tabs (Furosemide) .Marland Kitchen... 1 by mouth every other  day  The following medications were removed from the medication list:    Adult Aspirin Ec Low Strength 81 Mg Tbec (Aspirin) ..... Once daily His updated medication list for this problem includes:    Lisinopril-hydrochlorothiazide 20-12.5 Mg Tabs (Lisinopril-hydrochlorothiazide) .Marland Kitchen... 1 tab by mouth once daily    Furosemide 20 Mg Tabs (Furosemide) .Marland Kitchen... 1 by mouth every other day  Problem # 3:  EDEMA (ICD-782.3) Dependant and made worse by Actos.  Continue current dose of diuretic and alernative oral hypoglycemic  Other Orders: Nutrition Referral (Nutrition)  Patient Instructions: 1)  We are referring you to a nutritionist- Lubertha Sayres @ [facility]. After we send in your paperwork, they will contact you with an appointment. 2)  We will see you back on an as needed basis.   EKG Report  Procedure date:  09/30/2009  Findings:      NSR 81 Normal ECG

## 2010-04-01 NOTE — Progress Notes (Signed)
Summary: pt has left Dr. Phoebe Perch  Phone Note Call from Patient   Caller: Patient Call For: Roderick Pee MD Summary of Call: [phone] Pt did not get to see Dr. Phoebe Perch yesterday, and wants to be referred to another group of spine specialists. Initial call taken by: Lynann Beaver CMA,  May 08, 2009 1:19 PM  Follow-up for Phone Call        I would not recommend he switched doctors,,,,,,, it may take two to 4 weeks to see a new doctor if he wants to wait         that fine ........ The group is [facility]......Marland Kitchen  Dr. Danielle Dess Follow-up by: Roderick Pee MD,  May 08, 2009 1:48 PM  Additional Follow-up for Phone Call Additional follow up Details #1::        Pt. advised and referral sent to [facility]. Additional Follow-up by: Lynann Beaver CMA,  May 08, 2009 1:55 PM

## 2010-04-01 NOTE — Progress Notes (Signed)
  Phone Note Other Incoming   Request: Send information Summary of Call: Request for records received from [facility]. Request forwarded to [facility].     

## 2010-04-01 NOTE — Letter (Signed)
Summary: Office Visit Letter  [facility]  [address] [place], [place] [address]   Phone: [phone]  Fax: [phone]      May 27, 2009 MRN: [id number]   Kyjuan Orosz [address] [place], [place]  [address]   Dear Mr. Myung,   According to our records, it is time for you to schedule a follow-up office visit with [place].   At your convenience, please call [phone] (option #2)to schedule an office visit. If you have any questions, concerns, or feel that this letter is in error, we would appreciate your call.   Sincerely,  [name]. Jarold Motto, M.D.  [facility] [phone]

## 2010-04-01 NOTE — Progress Notes (Signed)
Summary: Refill   Phone Note From Pharmacy   [facility] [address] [phone]* Summary of Call: Refill requested on Clonazepam. Initial call taken by: Ashok Cordia RN,  October 26, 2009 2:55 PM    Prescriptions: CLONAZEPAM 1 MG  TABS (CLONAZEPAM) 1 tab tid  #90 x 0   Entered by:   Ashok Cordia RN   Authorized by:   Mardella Layman MD [facility]   Signed by:   Ashok Cordia RN on 10/26/2009   Method used:   Printed then faxed to ...       [facility] [address] [phone]* (retail)       [address]       [place], [place]  [address]       Ph: [phone] or [phone]       Fax: [phone]   RxID:   [id number]

## 2010-05-13 LAB — BASIC METABOLIC PANEL
Calcium: 9.3 mg/dL (ref 8.4–10.5)
Chloride: 107 mEq/L (ref 96–112)
GFR calc Af Amer: >60 mL/min (ref >60)
GFR calc non Af Amer: >60 mL/min (ref >60)
Glucose, Bld: 120 mg/dL — ABNORMAL HIGH (ref 70–99)
Potassium: 3.5 mEq/L (ref 3.5–5.1)
Sodium: 141 mEq/L (ref 135–145)

## 2010-05-13 LAB — RAPID URINE DRUG SCREEN, HOSP PERFORMED: Benzodiazepines: POSITIVE — AB

## 2010-05-13 LAB — CBC
MCH: 28.9 pg (ref 26.0–34.0)
MCHC: 34.2 g/dL (ref 30.0–36.0)
Platelets: 360 10*3/uL (ref 150–400)
RDW: 15.2 % (ref 11.5–15.5)
WBC: 7.5 10*3/uL (ref 4.0–10.5)

## 2010-05-13 LAB — DIFFERENTIAL
Basophils Relative: 1 % (ref 0–1)
Eosinophils Relative: 3 % (ref 0–5)
Lymphs Abs: 2.7 10*3/uL (ref 0.7–4.0)
Monocytes Relative: 2 % — ABNORMAL LOW (ref 3–12)
Neutrophils Relative %: 58 % (ref 43–77)

## 2010-05-13 LAB — URINALYSIS, ROUTINE W REFLEX MICROSCOPIC
Bilirubin Urine: NEGATIVE
Glucose, UA: NEGATIVE mg/dL
Ketones, ur: NEGATIVE mg/dL
Nitrite: NEGATIVE
Urobilinogen, UA: 0.2 mg/dL (ref 0.0–1.0)
pH: 6 (ref 5.0–8.0)

## 2010-05-13 LAB — ETHANOL: Alcohol, Ethyl (B): 167 mg/dL — ABNORMAL HIGH (ref 0–10)

## 2010-05-16 ENCOUNTER — Other Ambulatory Visit: Payer: Self-pay | Admitting: Family Medicine

## 2010-07-13 NOTE — Assessment & Plan Note (Signed)
[facility]                            CARDIOLOGY OFFICE NOTE   Joseph Valenzuela, Joseph Valenzuela                     MRN:          [id number]  DATE:03/28/2007                            DOB:          [birth year]    Joseph Valenzuela is a 35-year patient referred by [facility] and Dr.  Cathren Laine for chest pain.  The patient was evaluated on March 25, 2007.  The patient has been having pain for about 2 weeks and has  significant GI overtones.  Most of the time it is related to eating  food.  He gets a pressure in the center of his chest.  Sometimes he can  belch and  feel better.  It is been recurrent and tends not to occur  when he eats salad.   He has been on over-the-counter Prilosec for about 6 years.  He has  never formally seen a GI doctor in the past.  Has not had insurance.  He  has not been on Protonix or Carafate.   Apparently the patient now has insurance through his wife.   She sees Dr. Tawanna Cooler.  He would like a referral for primary care doctor.   The pains do not occur with exertion.  There is no PND, orthopnea.  The  patient is under a lot of stress.  He is concerned about work, the  economy.   He admits a lot of this may be related to stress.   He has never had a previous heart problem or stress test.  His risk  factors primarily include central obesity.   The patient does not smoke cigarettes.  He does smoke pot and does this  fairly regularly.   His past medical history is fairly benign.  He has not had any previous  surgery.  He has multiple tattoos.  He does not take any medicines  regularly except for over-the-counter Prilosec.   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  Needless to say, businesses  waning and he is concerned about being unemployed.  He admits to getting  Xanax from time to time.  Obviously, he does not get this legally, but  thinks he needs it for relaxation.  He also smokes pot.  He denies  drinking alcohol or  smoking cigarettes.  He was married to about a year  ago, last Halloween.  His wife was with him today.   The the patient's family history is remarkable for mother being alive at  age 32.  Father dying of sarcoid at age 46.   EXAM:  Remarkable for an obese white male in no distress.  He has  multiple tattoos all over his body.  His blood pressure is 150/89, pulse 82 and regular.  Affect somewhat  worried.  Weight 356, respiratory rate 18, afebrile.  HEENT:  Unremarkable.  Carotids are without bruit, no lymphadenopathy, thyromegaly, JVP  elevation.  LUNGS:  Clear diaphragmatic motion.  No wheezing.  S1-S2 distant heart sounds.  ABDOMEN:  Protuberant.  Bowel sounds positive, no tenderness.  No  hepatosplenomegaly.  No hepatojugular reflux.  No  bruit.  Distal pulse intact.  Trace edema.  NEURO:  Nonfocal.  SKIN:  Warm and dry.   EKG shows sinus rhythm with left axis deviation, nonspecific ST-T wave  changes.   IMPRESSION:  1. Chest pressure, more gastrointestinal in nature.  Continue over-the-      counter Prilosec.  Referral to GI for possible endoscopy.  2. Abnormal EKG in the setting of chest pain.  I think that given his      young age the best thing to do would be to do both the resting and      stress dobutamine echo.  If his images are not satisfactory, he can      be referred for stress Myoview testing.  Continue baby aspirin a      day.  3. Central obesity.  The patient does not take good care of himself.      Up until recently has not had insurance.  He needs to increase his      activity level and sometime may be a candidate for bariatric      surgery or referral to the [facility].  4. Borderline hypertension, low-salt diet.  Monitor blood pressures as      frequently as possible.  Depending on results of stress test,      consider starting low-dose diuretic.  5. Xanax and THC abuse.  I did not go into this very much with the      patient.  He preferred not to  tell me how he gets a hold of his      Xanax.  Clearly, there are some issues with drug abuse.   I will see the patient on an as-needed basis.  So long as his stress  echo and dobutamine echo are normal, he will try to get into see Dr.  Tawanna Cooler for primary care needs, and also our GI department for possible  EGD.     Noralyn Pick. Eden Emms, MD, [facility]  Electronically Signed    PCN/MedQ  DD: 03/28/2007  DT: 03/28/2007  Job #: [id number]

## 2010-07-13 NOTE — H&P (Signed)
NAMEANIRUDDH, CIAVARELLA NO.:  [id number]   MEDICAL RECORD NO.:  [id number]          PATIENT TYPE:  IPS   LOCATION:  0501                          FACILITY:  [facility]   PHYSICIAN:  Anselm Jungling, MD  DATE OF BIRTH:  June 23, 1975   DATE OF ADMISSION:  11/16/2007  DATE OF DISCHARGE:                       PSYCHIATRIC ADMISSION ASSESSMENT   A 35 year old male involuntary committed on November 28, 2007, to the  services of Dr. Marzetta Board.  The patient is here on petition.  The  commitment papers state that the patient arrived to the emergency room  with a gun claiming self-harm was eminent.  He was angry.  States he has  anger management problems.  The patient does report a history of alcohol  use, was drinking yesterday.  He states that it was the middle of the  night and he had no one to talk to.  He states he was drinking at the  time.  He was beginning to look at family photos when he saw a photo of  a friend who had committed suicide in the past.  The patient got very  upset and apparently at that time had taken himself to the emergency  room.  In the triage area of the emergency room department the patient  had a loaded gun.  He did dismantle this in front of the staff  threatening that if he was not seen in 4 hours he will walk out and bad  things will happen.  Reporting his depression has been getting worse  related to multiple stressors.  He does report having significant family  members pass, states that he is tired of putting people he knows in the  ground.   PAST PSYCHIATRIC HISTORY:  First admission to [facility].  He sees Dr. Dellia Cloud for individual therapy.   SOCIAL HISTORY:  A 35 year old male.  He is unmarried and has no  children.  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   FAMILY HISTORY:  Unknown.   ALCOHOL AND DRUG HISTORY:  Has had some recent drinking.  He had a blood  alcohol level of 225 in the emergency room.  Urine drug screen was also  positive for marijuana.   PRIMARY CARE Jaleil Renwick:  Is Dr. Eloise Harman.   MEDICAL PROBLEMS:  The patient has had a workup for alcoholic hepatitis,  hiatal hernia and is non-insulin-dependent diabetes.   MEDICATIONS:  1. Currently is on Actos 45 mg.  2. Lisinopril hydrochlorothiazide 25/12.5.  3. Klonopin 2 mg at h.s.  4. He takes Kapidex 60 mg daily for reflux.  5. Also prescribed Librium.   DRUG ALLERGIES:  CODEINE.   PHYSICAL EXAMINATION:  This is an obese young male who was assessed at  [facility] emergency department.  Again, the patient presented with a  loaded hand gun.  He did have a temperature of 97, 110 heart rate, 82  respirations, blood pressure is 140/90.  He is 5 feet 11 inches tall,  347 pounds.   LABORATORY DATA:  Shows urinalysis negative.  Urine drug screen  positive  for benzodiazepines, positive for THC.  Alcohol level 225.   MENTAL STATUS EXAM:  The patient is in his room sitting on the side of  the bed, fully alert, cooperative.  He has multiple tattoos noted.  He  has good eye contact.  Speech is slow and clear but rambling some going  back talking about recalling events of friends and family that have  passed.  His affect is constricted.  Thought processes - no delusional  statements.  Thoughts are organized.  Cognitive function intact.  Memory  appears to be good.  Judgment and insight limited.   AXIS I:  Mood disorder not otherwise specified.  Alcohol abuse rule out  dependence.  THC abuse.  AXIS II:  Deferred.  AXIS III:  Non-insulin diabetes.  Hypertension and reflux.  AXIS IV:  Psychosocial problems.  Medical problems.  AXIS V:  Current is 40.   PLAN:  Stabilize mood thinking.  Will put patient on Librium protocol  which was discussed with the patient.  We will discontinue his Klonopin.  Will work on relapse prevention.  Will continue to assess comorbidities.  Will have a  family session with wife for concerns and support.  Will  also review, have talked to wife about securing any other weapons that  may be in the home.  His tentative length of stay at this time is 4-5  days.      Landry Corporal, N.P.      Anselm Jungling, MD  Electronically Signed    JO/MEDQ  D:  11/17/2007  T:  11/19/2007  Job:  [id number]

## 2010-07-13 NOTE — Assessment & Plan Note (Signed)
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 Eden Emms for evaluation of atypical chest pain.   Maricus has had acid reflux for many years and has been on Prilosec for a  good period of time.  Over the last 3 weeks he has had worsening,  substernal and left precordial chest pain radiating to his back with  belching, burping, but no real dysphagia.  He does have some odynophagia  with spicy and citrus-based foods.  He recently was in [facility]  emergency room and had evaluation by Dr. Eden Emms which has included  cardiogram, chest x-ray and stress dobutamine test, all of which have  been normal.   Mr. Chicoine denies any hepatobiliary complaints and clay-colored  stools, dark urine, icterus, fever or chills, nausea and vomiting.  He  has had a 30 to 40 pound weight gain over the last year which has  exacerbated his acid reflux.  He has never had endoscopic exams, barium  studies, or ultrasound exams.  Associated with his current illness over  the last several weeks has been increased abdominal gas, bloating,  distention and constipation.  It is of note that the patient is,  apparently, a heavy user of alcohol, and it sounds like he is a rather  heavy binge drinker.  He also, in the past, has used some illicit drugs,  but denies IV drug use.  He has a long history of psychotic depression  and previously was on Zyprexa and had numerous psychiatric problems, and  has a history of prior LSD abuse.  Other medical problems have included  essential hypertension.  He has had no known cardiac events, otherwise,  as mentioned above.   The patient specifically denies melena, hematochezia, fever or chills.  He denies abuse  of NSAIDs or salicylates.  The only medicine he is  taking now is p.r.n. aspirin and over-the-counter Prilosec.  He does  give a history of nausea and vomiting with codeine use.   FAMILY HISTORY:  Noncontributory in terms of known gastrointestinal  problems.  His grandmother had stomach cancer.   SOCIAL HISTORY:  He is married, lives with his wife, and has a 12th  grade education.  He does not smoke, but does have a long history of  alcohol abuse.   REVIEW OF SYSTEMS:  Negative for any cardiovascular, pulmonary,  genitourinary, neurologic, orthopedic, endocrine, or neuropsychiatric  problems at this time.  Review of systems, otherwise, noncontributory.   PHYSICAL EXAMINATION:  He is a non-toxic, healthy-appearing white male  appearing his stated age in no acute distress.  He is 5 feet 11 inches and weighs 350 pounds.  Blood pressure is 170/100  and pulse was 80 and regular.  I could not appreciate stigmata of chronic liver disease or thyromegaly.  His chest was clear to percussion and auscultation.  He  was in a normal rhythm without significant murmurs, gallops, or rubs.  His abdomen was markedly obese with definite organomegaly, masses, or  tenderness.  Bowel sounds were normal.  There was no peripheral edema, phlebitis, or swollen joints, but he did  have multiple tattoos.  Rectal exam was deferred.  Mental status was normal.   ASSESSMENT:  1. Chronic gastroesophageal reflux disease with recent exacerbation,      rule out cholelithiasis with associated resolving pancreatitis,      either from gallstones or alcohol abuse.  2. Marked exogenous obesity.  3. History of previous LSD and drug abuse, but not IV drug abuse.  4. Mild ileus-like symptoms, problem secondary to #1.  5. History of chronic ethanol abuse.  6. Probable essential hypertension.   RECOMMENDATIONS:  1. Outpatient ultrasound exam and screening laboratory parameters      including liver function tests, amylase,  and lipase.  2. Outpatient endoscopic exam.  3. Will change to Aciphex 20 mg twice a day 30 minutes before meals      with Carafate suspension, 1 tbs every 2 to 4 hours.  4. Acid reflux dietary restrictions.  5. Follow up on blood pressure and see if patient will need an      angiotensin-converting enzyme inhibitor initiation.   ADDENDUM:  I suspect the patient will have ultrasound evidence of fatty  infiltration of the liver.  We certainly will need to work on his weight  loss and establish whether or not he has associated metabolic syndrome  per his rather marked obesity.     [name]. Jarold Motto, MD, Caleen Essex, FAGA  Electronically Signed    DRP/MedQ  DD: 04/12/2007  DT: 04/13/2007  Job #: [id number]   cc:   Noralyn Pick. Eden Emms, MD, [facility]  Eugenio Hoes. Tawanna Cooler, MD

## 2010-07-13 NOTE — Discharge Summary (Signed)
NAMESOPHEAP, BOEHLE              ACCOUNT NO.:  [id number]   MEDICAL RECORD NO.:  [id number]          PATIENT TYPE:  IPS   LOCATION:  0501                          FACILITY:  [facility]   PHYSICIAN:  Geoffery Lyons, M.D.      DATE OF BIRTH:  1975/05/04   DATE OF ADMISSION:  11/16/2007  DATE OF DISCHARGE:  11/19/2007                               DISCHARGE SUMMARY   CHIEF COMPLAINT/PRESENT ILLNESS:  This was the 1st admission to [facility] for this 35 year old male involuntarily  committed.  He arrived to the emergency room with a gun claiming self-  harm. He was angry.  He endorsed having problem with anger management.  History of alcohol abuse.  He was drinking the day before.  Endorsed it  was in the middle of the night had no one to talk to.  He was beginning  to look at family photos when he saw photo of a friend who has committed  suicide in the past.  Got very upset and took himself to the emergency  room.  There he had a loaded gun.  Endorsed that depression had been  getting worse.  Endorsed multiple stressors.  Endorsed a history of a  number of family members dying. Claimed that he was tired of putting  people he knew in the ground.   PAST PSYCHIATRIC HISTORY:  First time at [facility].  Saw Dr.  Dellia Cloud for individual psychotherapy.   ALCOHOL AND DRUG HISTORY:  As already stated has been drinking.  Alcohol  level upon admission 225.  UDS positive for marijuana.   MEDICAL HISTORY:  Non-insulin-dependent diabetes.  Has had workup for  alcoholic hepatitis.   MEDICATIONS:  1. Actos 45 mg per day.  2. Lisinopril/hydrochlorothiazide 25/12.5 mg per day.  3. Klonopin 2 mg at night.  4. Kapidex 60 mg for reflux.  5. He was also given some Librium.   PHYSICAL EXAMINATION:  Failed to show any acute findings.   LABORATORY WORK UP:  UDS positive for benzodiazepines, marijuana,  alcohol level 225 as already stated.  TSH 9.103.   MENTAL STATUS EXAM:  Reveals  an alert cooperative male, good eye  contact.  Speech is slow, clear but rambling, some going back to being  about recurring events of friends and family that have died.  Face  constricted, at times some monologue-like production.  Thought process:  Denies active suicidal, homicidal idea, no evidence of delusions, no  hallucinations.  Cognition well-preserved.   ADMITTING DIAGNOSES:  Axis I:  Mood disorder not otherwise specified.  Depressive disorder not otherwise specified.  Alcohol abuse, rule out  dependence.  Marijuana abuse.  Axis II:  No diagnosis.  Axis III:  Insulin-dependent diabetes mellitus.  Hypertension.  Reflux.  Axis IV:  Moderate.  Axis V:  Upon admission 35-40, highest GAF in the last year 60-65.   COURSE IN THE HOSPITAL:  Was admitted.  He was started in individual and  group psychotherapy.  He was detoxified with Librium.  As already stated  a 35 year old male who endorsed that he has been having a  very hard  time.  Reports a traumatic event in which he had to pick up literally  the pieces of his best friend's head who committed suicide, and that he  has memories and flashbacks.  Admits he has been drinking a lot and  working with Dr. Dellia Cloud.  Working with Dr. Jarold Motto to decrease the  amount of alcohol he has been drinking.  Had been working with Dr.  Dellia Cloud for psychotherapy.  The day of the admission he was still  drinking rum.  Sat in front of the computer, was organizing his  pictures, and ran across pictures of his best friend and other people  who have died.  Became very overwhelmed, sad, started thinking about  killing himself.  Took the loaded gun, and was contemplating killing  himself.   PAST PSYCHIATRIC HISTORY:  Had what he claimed a psychotic break  secondary to LSD.  Was given Zyprexa, Keppra for more or less a year.  He gained a lot of weight.  He has not been able to get rid of the  weight which makes him pretty upset.  Took Prozac in the  past.  He  claimed that Prozac locked his jaw.  Endorsed that Dr. Dellia Cloud thinks  that he is bipolar.   MENTAL STATUS EXAM:  Reveals alert, cooperative, in bed, afraid of the  other patients in the locked unit, pretty insightful.  Wanted to get his  life back together, ruminates about the trauma, a lot of regrets, shame,  guilt.  September 20, he continued to look back at the events that led  to his admission.  Committed to abstinence.  Was going to ask his wife  to help him by abstaining herself, dealing with all the losses, the  friend killing himself.  It was out of shame and guilt for all that he  had done to his family with his past behavior.  Family session September  20, with mother and his wife.  He did endorse in the session that he had  done a lot of thinking and that he, indeed, has a problem with alcohol.  Still struggling with the death of his friend 4 years ago.  Family did  point out that he had been isolating. September 21, he was in full  contact with reality.  Mood improved, affect brighter.  Brother endorsed  no active suicidal, homicidal ideas.  Endorsed commitment to abstinence,  sobriety.  Chose not to try any medications.  Wanted to see Dr. Evelene Croon  with whom he had an appointment before deciding on taking any  medications.  After detox he was going to continue to work on staying  abstinent.  The feeling was that he wanted to see how his mood will be  after he abstained.   DISCHARGE DIAGNOSES:  Axis I:  Post-traumatic stress disorder.  Mood  disorder not otherwise specified.  Alcohol and marijuana abuse.  Axis II:  No diagnosis.  Axis III:  No diagnosis.  Axis IV:  Moderate.  Axis V:  On discharge 50-55.   Discharged on:  1. Librium taper 25 mg one at 5 p.m. September 21, then one in the      morning September 22, then discontinue.  2. Actos 45 mg per day.  3. Prinivil 20 mg per day.  4. Hydrochlorothiazide 12.5 mg per day.  5. Kapidex 60 mg every 24  hours.   FOLLOWUP:  Dr. Dellia Cloud and Dr. Evelene Croon.      Geoffery Lyons, M.D.  Electronically Signed     IL/MEDQ  D:  12/05/2007  T:  12/06/2007  Job:  [id number]

## 2010-07-13 NOTE — Assessment & Plan Note (Signed)
[facility]                         GASTROENTEROLOGY OFFICE NOTE   Joseph, Valenzuela                     MRN:          [id number]  DATE:05/03/2007                            DOB:          06-May-1975    Joseph Valenzuela returns today for followup of his endoscopy, ultrasound, and lab  tests.  He had endoscopy on April 18, 2007 which showed a 4-cm hiatal  hernia.  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  He currently is on Protonix 40 mg  twice a day.  He is doing better symptomatically.   He had elevated liver function tests with an SGOT of 121, SGPT of 102,  and a serum albumin of 4.2, a normal bilirubin and alkaline phosphatase.  A CBC was otherwise normal and blood sugar was 100 mg percent.  Ultrasound of the abdomen showed no evidence of cholelithiasis or other  abnormalities.  Hepatitis C antibody and hepatitis B surface antigens  were negative.   Joseph Valenzuela returns today after I have counseled him to stop drinking alcohol,  and he has been unable to do this because of his environmental  situations.  We even tried Klonopin 1 mg twice a day which did not help  his problems.  He really does not want to get on benzodiazepines since  he has a long history of drug abuse and alcohol abuse.  He has had  problems with marked obesity, has been unable to lose weight, also.  He  has an appointment today to see a Dr. Loleta Chance who is a clinical  psychologist to help manage him with his addiction problems and alcohol  abuse.   Today Joseph Valenzuela weighs 353 pounds.  Blood pressure 118/86 and pulse was 72  and regular.  He was awake and alert in no acute distress.  I could not appreciate stigmata of chronic liver disease or any mental  status changes.  His abdomen was markedly obese but without definite organomegaly, masses  or tenderness.   ASSESSMENT:  1. Alcoholic hepatitis in a massively obese white male which is a  bad      combination which probably will lead to significant liver damage      without intensive medical treatment and abstinence from alcohol.  2. Exclude underlying metabolic liver disease.  3. History of alcohol and drug addictions.  4. 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  5. History of hypertensive cardiovascular disease, being treated with      atenolol 10/25 mg a half a tablet a day, and 325 mg of aspirin a      day.  The patient recently had 2D echocardiogram by Dr. Eden Emms, and      the patient has not heard from cardiology and would appreciate a      call.  This exam showed a 60% ejection fraction with some left      atrial enlargement.   RECOMMENDATIONS:  1. The patient is to see Dr. Loleta Chance and may be a candidate for      acamprosate 660 mg 3 times a day.  2. Continued reflux regimen and twice a day Protonix.  3. Repeat liver profile, and also will check iron levels.  4. We will need to consider obesity treatment in this patient once we      have addressed some of his other issues.  5. Continue blood pressure control, follow up with Dr. Tawanna Cooler as      previously planned.  I will see him back in 2 to 3 weeks time for      followup.     [name]. Jarold Motto, MD, [facility], FAGA  Electronically Signed    DRP/MedQ  DD: 05/03/2007  DT: 05/03/2007  Job #: [id number]   cc:   Tawanna Cooler, MD  Noralyn Pick Eden Emms, MD, [facility]

## 2010-07-13 NOTE — Assessment & Plan Note (Signed)
Joseph Valenzuela                         GASTROENTEROLOGY OFFICE NOTE   Joseph Valenzuela, Joseph Valenzuela                     MRN:          [id number]  DATE:05/22/2007                            DOB:          Jul 01, 1975    Joseph Valenzuela is doing better, and he has been able to pretty much cut down on  his alcohol. He is having 1 to 2 glasses of wine several times of week.  He is trying to exercise and watch his weight. Otherwise, he is doing  well. His blood pressure is under control on Atenolol with varying doses  per Dr. Tawanna Valenzuela who he saw today. His acid reflux is controlled with  Protonix 40 mg twice a day, but I will switch him to Aciphex 20 mg twice  a day because of financial concerns and availability of drug samples. He  is on Klonopin 1 mg twice a day for his chronic anxiety.   Liver function tests from May 03, 2007, shows his SGOT to be 54, an  SGPT of 44 and otherwise normal liver enzymes with an albumin of 3.9  grams%, normal prothrombin time, B12, and folate levels.   Exam today shows his weight to be down 2 pounds to 351. Blood pressure  is 130/100, and pulse was 64 and regular. General physical exam was not  repeated.   ASSESSMENT:  1. Fatty liver with alcoholic hepatitis which seems to be improving      with decrease in alcohol levels and weight loss.  2. Essential hypertension with current drug dosage change per Dr.      Tawanna Valenzuela.  3. Long history of chronic anxiety syndrome and addiction problems.   RECOMMENDATIONS:  1. Continue all medication listed above.  2. Samples of Aciphex given for 20 mg twice a day 30 minutes before      meals.  3. GI followup in 6 weeks' time with liver profile before his office      visit.     Joseph Valenzuela. Joseph Motto, MD, Joseph Valenzuela, FAGA  Electronically Signed    DRP/MedQ  DD: 05/22/2007  DT: 05/22/2007  Job #: [id number]   cc:   Dr. Volney Valenzuela C. Joseph Emms, MD, [facility]

## 2010-10-01 ENCOUNTER — Encounter: Payer: Self-pay | Admitting: Gastroenterology

## 2010-10-01 ENCOUNTER — Ambulatory Visit (INDEPENDENT_AMBULATORY_CARE_PROVIDER_SITE_OTHER): Payer: 59 | Admitting: Gastroenterology

## 2010-10-01 DIAGNOSIS — F419 Anxiety disorder, unspecified: Secondary | ICD-10-CM

## 2010-10-01 DIAGNOSIS — I10 Essential (primary) hypertension: Secondary | ICD-10-CM | POA: Insufficient documentation

## 2010-10-01 DIAGNOSIS — K219 Gastro-esophageal reflux disease without esophagitis: Secondary | ICD-10-CM

## 2010-10-01 DIAGNOSIS — F329 Major depressive disorder, single episode, unspecified: Secondary | ICD-10-CM | POA: Insufficient documentation

## 2010-10-01 DIAGNOSIS — F341 Dysthymic disorder: Secondary | ICD-10-CM

## 2010-10-01 MED ORDER — CLONAZEPAM 1 MG PO TABS: 1.0000 mg | ORAL_TABLET | Freq: Three times a day (TID) | ORAL | Status: DC | PRN

## 2010-10-01 MED ORDER — CLONAZEPAM 1 MG PO TABS: 2.0000 mg | ORAL_TABLET | Freq: Four times a day (QID) | ORAL | Status: DC | PRN

## 2010-10-01 NOTE — Progress Notes (Signed)
History of Present Illness: This is a elderly obese 35 year old Caucasian male with chronic anxiety syndrome arch related to his obesity and limited ability to function normally because of his obesity. He has been under the care of Dr. Evelene Croon and is currently on Klonopin 1 mg 4 times a day. He also is being treated for chronic depression but is not on antidepressants currently. He has documented fatty liver, obesity-related GERD, and hypertension. He currently is very upset about the death of his dog, is crying during our interview. He denies nausea vomiting, fever, chills, or hepatobiliary complaints, or lower gastrointestinal problems. He has a previous history of alcohol and pot abuse, but has been off of these supplements as for 6 months. He does have chronic insomnia additionally.    Current Medications, Allergies, Past Medical History, Past Surgical History, Family History and Social History were reviewed in [employer] record.   Assessment and plan: Blood pressure today is 136/90 and is well controlled with lisinopril-HCTZ. I have reviewed his Klonopin for his chronic anxiety syndrome and have urged him to continue psychiatric followup. I think he is a good candidate for bariatric surgery to help his multiple problems. Of course, he would need to standard psychological evaluation before surgery, but I have set him up to see Dr. Luretha Murphy [facility] surgery. I did not repeat any lab data at this time. He is to continue Dexilant 60 mg a day as tolerated. No diagnosis found.

## 2010-10-01 NOTE — Patient Instructions (Addendum)
We have printed your klonopin 1mg  three times a day rx and given it to you today. We have given you information on the Bariatric Program at [facility].

## 2010-11-19 LAB — DIFFERENTIAL
Basophils Relative: 0
Lymphocytes Relative: 24
Lymphs Abs: 2.5
Monocytes Absolute: 0.5
Monocytes Relative: 5
Neutro Abs: 7.1
Neutrophils Relative %: 68

## 2010-11-19 LAB — CBC
Hemoglobin: 15.3
MCHC: 32.7
RBC: 5.63
WBC: 10.4

## 2010-11-19 LAB — I-STAT 8, (EC8 V) (CONVERTED LAB)
Acid-Base Excess: 2
Bicarbonate: 29.8 — ABNORMAL HIGH
HCT: 52
Operator id: 285491
pCO2, Ven: 58.7 — ABNORMAL HIGH

## 2010-11-19 LAB — POCT CARDIAC MARKERS
CKMB, poc: 1.6
Operator id: 285491
Troponin i, poc: <0.05

## 2010-11-19 LAB — POCT I-STAT CREATININE: Creatinine, Ser: 1

## 2010-11-26 LAB — COMPREHENSIVE METABOLIC PANEL
Albumin: 4.2
Alkaline Phosphatase: 83
BUN: 8
CO2: 27
Chloride: 102
Creatinine, Ser: 0.98
GFR calc non Af Amer: >60
Glucose, Bld: 95
Total Bilirubin: 1.1

## 2010-11-26 LAB — CBC
HCT: 46.7
Hemoglobin: 15.5
MCV: 83.7
Platelets: 376
WBC: 8.7

## 2010-11-26 LAB — DIFFERENTIAL
Basophils Absolute: 0
Basophils Relative: 0
Lymphocytes Relative: 45
Neutro Abs: 4.1
Neutrophils Relative %: 48

## 2010-11-26 LAB — RAPID URINE DRUG SCREEN, HOSP PERFORMED
Barbiturates: NOT DETECTED
Benzodiazepines: POSITIVE — AB

## 2010-11-29 LAB — GLUCOSE, CAPILLARY
Glucose-Capillary: 109 — ABNORMAL HIGH
Glucose-Capillary: 109 — ABNORMAL HIGH
Glucose-Capillary: 88

## 2010-11-29 LAB — BASIC METABOLIC PANEL
CO2: 28
Calcium: 9.1
Chloride: 102
Creatinine, Ser: 1.07
GFR calc Af Amer: >60
Sodium: 139

## 2010-11-29 LAB — URINALYSIS, ROUTINE W REFLEX MICROSCOPIC
Nitrite: NEGATIVE
Protein, ur: NEGATIVE
Specific Gravity, Urine: 1.009
Urobilinogen, UA: 0.2

## 2010-11-29 LAB — DIFFERENTIAL
Basophils Relative: 1
Lymphs Abs: 3.7
Monocytes Absolute: 0.3
Monocytes Relative: 4
Neutro Abs: 3.6
Neutrophils Relative %: 45

## 2010-11-29 LAB — RAPID URINE DRUG SCREEN, HOSP PERFORMED
Barbiturates: NOT DETECTED
Opiates: NOT DETECTED

## 2010-11-29 LAB — CBC
Hemoglobin: 15.5
MCHC: 33.7
MCV: 83.7
RBC: 5.5
WBC: 8

## 2010-11-29 LAB — ETHANOL: Alcohol, Ethyl (B): 225 — ABNORMAL HIGH

## 2010-11-29 LAB — TSH: TSH: 9.103 — ABNORMAL HIGH

## 2010-12-01 LAB — RAPID URINE DRUG SCREEN, HOSP PERFORMED
Amphetamines: NOT DETECTED
Benzodiazepines: POSITIVE — AB
Cocaine: NOT DETECTED
Tetrahydrocannabinol: POSITIVE — AB

## 2010-12-01 LAB — DIFFERENTIAL
Eosinophils Relative: 3
Lymphocytes Relative: 47 — ABNORMAL HIGH
Lymphs Abs: 3.3
Monocytes Absolute: 0.3

## 2010-12-01 LAB — COMPREHENSIVE METABOLIC PANEL
AST: 24
Albumin: 4.1
Alkaline Phosphatase: 75
BUN: 8
Chloride: 104
GFR calc Af Amer: >60
Potassium: 3.4 — ABNORMAL LOW
Sodium: 141
Total Protein: 7.1

## 2010-12-01 LAB — ETHANOL: Alcohol, Ethyl (B): 232 — ABNORMAL HIGH

## 2010-12-01 LAB — CBC
HCT: 43.8
Platelets: 381
RDW: 14.3
WBC: 7.1

## 2010-12-27 ENCOUNTER — Other Ambulatory Visit: Payer: Self-pay | Admitting: Family Medicine

## 2011-01-24 ENCOUNTER — Other Ambulatory Visit: Payer: Self-pay | Admitting: Gastroenterology

## 2011-01-25 MED ORDER — CLONAZEPAM 1 MG PO TABS: 1.0000 mg | ORAL_TABLET | Freq: Three times a day (TID) | ORAL | Status: DC | PRN

## 2011-01-25 NOTE — Telephone Encounter (Signed)
rx printed and faxed

## 2011-03-22 ENCOUNTER — Other Ambulatory Visit: Payer: Self-pay | Admitting: Gastroenterology

## 2011-05-10 ENCOUNTER — Other Ambulatory Visit: Payer: Self-pay | Admitting: *Deleted

## 2011-06-15 ENCOUNTER — Other Ambulatory Visit: Payer: Self-pay | Admitting: Gastroenterology

## 2011-06-15 MED ORDER — CLONAZEPAM 1 MG PO TABS: ORAL_TABLET | ORAL | Status: DC

## 2011-06-15 NOTE — Telephone Encounter (Signed)
rx printed and faxed to pharmacy, advised pt that he needs ov and he made one.

## 2011-07-05 ENCOUNTER — Other Ambulatory Visit (INDEPENDENT_AMBULATORY_CARE_PROVIDER_SITE_OTHER): Payer: 59

## 2011-07-05 ENCOUNTER — Ambulatory Visit (INDEPENDENT_AMBULATORY_CARE_PROVIDER_SITE_OTHER): Payer: 59 | Admitting: Gastroenterology

## 2011-07-05 ENCOUNTER — Other Ambulatory Visit: Payer: Self-pay | Admitting: Gastroenterology

## 2011-07-05 ENCOUNTER — Encounter: Payer: Self-pay | Admitting: Gastroenterology

## 2011-07-05 VITALS — BP 138/88 | HR 82 | Ht 70.0 in | Wt 361.0 lb

## 2011-07-05 DIAGNOSIS — E66813 Obesity, class 3: Secondary | ICD-10-CM

## 2011-07-05 DIAGNOSIS — K219 Gastro-esophageal reflux disease without esophagitis: Secondary | ICD-10-CM

## 2011-07-05 DIAGNOSIS — F411 Generalized anxiety disorder: Secondary | ICD-10-CM

## 2011-07-05 DIAGNOSIS — K589 Irritable bowel syndrome without diarrhea: Secondary | ICD-10-CM

## 2011-07-05 DIAGNOSIS — F419 Anxiety disorder, unspecified: Secondary | ICD-10-CM

## 2011-07-05 DIAGNOSIS — E538 Deficiency of other specified B group vitamins: Secondary | ICD-10-CM

## 2011-07-05 DIAGNOSIS — F1020 Alcohol dependence, uncomplicated: Secondary | ICD-10-CM

## 2011-07-05 LAB — CBC WITH DIFFERENTIAL/PLATELET
Basophils Relative: 1.6 % (ref 0.0–3.0)
Eosinophils Absolute: 0.3 10*3/uL (ref 0.0–0.7)
Eosinophils Relative: 3.1 % (ref 0.0–5.0)
Lymphocytes Relative: 34.3 % (ref 12.0–46.0)
MCHC: 32.9 g/dL (ref 30.0–36.0)
Neutrophils Relative %: 55.6 % (ref 43.0–77.0)
RBC: 5.3 Mil/uL (ref 4.22–5.81)
WBC: 9.2 10*3/uL (ref 4.5–10.5)

## 2011-07-05 LAB — HEPATIC FUNCTION PANEL
ALT: 52 U/L (ref 0–53)
Total Protein: 8.3 g/dL (ref 6.0–8.3)

## 2011-07-05 LAB — T3, FREE: T3, Free: 3.9 pg/mL (ref 2.3–4.2)

## 2011-07-05 LAB — FERRITIN: Ferritin: 39.7 ng/mL (ref 22.0–322.0)

## 2011-07-05 LAB — BASIC METABOLIC PANEL
CO2: 28 mEq/L (ref 19–32)
Calcium: 9.4 mg/dL (ref 8.4–10.5)
Creatinine, Ser: 0.8 mg/dL (ref 0.4–1.5)
Glucose, Bld: 102 mg/dL — ABNORMAL HIGH (ref 70–99)

## 2011-07-05 LAB — IBC PANEL: Saturation Ratios: 22.6 % (ref 20.0–50.0)

## 2011-07-05 LAB — VITAMIN B12: Vitamin B-12: 236 pg/mL (ref 211–911)

## 2011-07-05 LAB — T4: T4, Total: 9 ug/dL (ref 5.0–12.5)

## 2011-07-05 LAB — FOLATE: Folate: 5.1 ng/mL — ABNORMAL LOW (ref >5.9)

## 2011-07-05 MED ORDER — CLONAZEPAM 1 MG PO TABS: ORAL_TABLET | ORAL | Status: DC

## 2011-07-05 MED ORDER — DEXLANSOPRAZOLE 60 MG PO CPDR: 60.0000 mg | DELAYED_RELEASE_CAPSULE | Freq: Every day | ORAL | Status: DC

## 2011-07-05 MED ORDER — CYANOCOBALAMIN 1000 MCG/ML IJ SOLN
1000.0000 ug | INTRAMUSCULAR | Status: AC
  Administered 2011-07-06 – 2011-07-21 (×3): 1000 ug via INTRAMUSCULAR

## 2011-07-05 NOTE — Progress Notes (Signed)
[name] with chronic recurrent alcoholism, chronic anxiety syndrome, depression, chronic acid reflux well-managed on PPI therapy, and mild fatty liver associated with his morbid obesity. He has been through alcohol detoxification on several times, and has been seen previously by Dr. Caralyn Valenzuela in behavioral health, also Dr. Evelene Valenzuela in psychiatry, but currently is not under psychiatric therapy. I have consulted in the past with Dr. Dellia Valenzuela and we have prescribed this patient with Klonopin 1 mg 3 times a day which is helped his symptomatology greatly. [name], and attends AA meetings 3 times a week. When asked about his alcoholism he relates," I am [place]". He also has a previous history of substance abuse, but denies illicit drug use at this time. There is no history of abdominal pain, nausea vomiting, but he does have alternating diarrhea and constipation with lactose intolerance, and also other carbohydrate malabsorption associated with use of sorbitol and fructose. He is been unsuccessful his attempts to lose weight. Review of his labs from a year and a half ago showed slight elevation in his serum transaminases but otherwise normal liver function test and normal upper nominal ultrasound exam in 2009. He currently denies any psychotic tendencies, suicidally ideation, hallucinations with severe depression.  Current Medications, Allergies, Past Medical History, Past Surgical History, Family History and Social History were reviewed in [employer] record.  Pertinent Review of Systems Negative   Physical Exam: Morbid obesity patient is in no acute distress. He has no evidence of tremor with trauma. Blood pressure is 138/80 and pulse is 82 and regular. He weighs 361 pounds with a BMI of 51.80. I cannot appreciate stigmata of chronic liver disease. Chest is clear and his cardiac exam shows no  murmurs gallops or rubs. He has obese abdomen but no definite organomegaly, masses or tenderness. Bowel sounds are normal. Mental status is normal and there is no asterixis.    Assessment and Plan: His GERD is doing well on Dexilant 60 mg a day which was renewed. I also renewed his Klonopin 1 mg 3 times a day pending psychiatric evaluation by Dr. Betti Valenzuela. He may be a candidate for Vivitol injections, or perhaps po Acamprosate to help him with his alcohol craving. I have repeated his CBC, liver function tests, and we'll check celiac antibodies. I have given him a list of nonabsorbable carbohydrates to avoid with use of when necessary Beano and/or Lactaid tablets. His bowel or regularity seems most consistent with irritable bowel syndrome, and I do not think he needs colonoscopy at this time. Previous endoscopy exam showed no evidence of portal hypertension. He does have endoscopically proven acid reflux problems. Multiple attempts at weight loss and been unsuccessful, and he is not a candidate for bariatric surgery. Encounter Diagnosis  Name Primary?  . IBS (irritable bowel syndrome) Yes

## 2011-07-05 NOTE — Patient Instructions (Signed)
Please go to the basement today for your labs.  Klonopin refill has been printed and given to you today. Your Dexilant has been sent to your pharmacy.  Please go to the basement today for your labs.  Dr Jarold Motto wanted you to make an appt with Dr Ellamae Sia at [facility] [address] [place], [address] before they will make an appt you must call and register with their office at [phone].

## 2011-07-06 ENCOUNTER — Ambulatory Visit (INDEPENDENT_AMBULATORY_CARE_PROVIDER_SITE_OTHER): Payer: 59 | Admitting: Gastroenterology

## 2011-07-06 DIAGNOSIS — E538 Deficiency of other specified B group vitamins: Secondary | ICD-10-CM

## 2011-07-06 LAB — CELIAC PANEL 10
Gliadin IgG: 5.5 U/mL (ref <20)
Tissue Transglut Ab: 2.6 U/mL (ref <20)

## 2011-07-14 ENCOUNTER — Ambulatory Visit (INDEPENDENT_AMBULATORY_CARE_PROVIDER_SITE_OTHER): Payer: 59 | Admitting: Gastroenterology

## 2011-07-14 DIAGNOSIS — E538 Deficiency of other specified B group vitamins: Secondary | ICD-10-CM

## 2011-07-14 MED ORDER — FOLIC ACID 1 MG PO TABS: 1.0000 mg | ORAL_TABLET | Freq: Every day | ORAL | Status: DC

## 2011-07-15 NOTE — Progress Notes (Deleted)
This is a morbidly obese Caucasian male with chronic GERD, IBS, and severe anxiety syndrome. He continues to abuse alcohol, and has been allegedly in recovery for the last 2 months going to [employer]. He takes Klonopin on a regular basis for severe anxiety, but is not currently under psychiatric care because of financial difficulties.  Current Medications, Allergies, Past Medical History, Past Surgical History, Family History and Social History were reviewed in [employer] record.  Pertinent Review of Systems Negative   Physical Exam: Massively obese patient in no acute distress. He has obese abdomen no definite organomegaly, masses or tenderness. Mental status is normal to somewhat depressed.    Assessment and Plan: History of fatty liver secondary to his morbid obesity with continued alcohol abuse. There is been no evidence of portal hypertension or liver failure. His major problem is severe anxiety with associated depression, and I again have urged him to seek psychiatric care, and we have referred him to Dr.Reddy and [facility]. He also may be a good candidate for other medications to curb his alcohol craving. For now I have renewed his medications pending psychiatric referral. No diagnosis found.

## 2011-07-15 NOTE — Progress Notes (Signed)
b12 given

## 2011-07-21 ENCOUNTER — Ambulatory Visit (INDEPENDENT_AMBULATORY_CARE_PROVIDER_SITE_OTHER): Payer: 59 | Admitting: Gastroenterology

## 2011-07-21 DIAGNOSIS — E539 Vitamin B deficiency, unspecified: Secondary | ICD-10-CM

## 2011-07-21 DIAGNOSIS — E538 Deficiency of other specified B group vitamins: Secondary | ICD-10-CM

## 2011-07-21 MED ORDER — CYANOCOBALAMIN 1000 MCG/ML IJ SOLN: 1000.0000 ug | Freq: Once | INTRAMUSCULAR | Status: AC

## 2011-08-02 ENCOUNTER — Other Ambulatory Visit: Payer: Self-pay | Admitting: *Deleted

## 2011-08-02 MED ORDER — LISINOPRIL-HYDROCHLOROTHIAZIDE 20-12.5 MG PO TABS: 1.0000 | ORAL_TABLET | Freq: Every day | ORAL | Status: DC

## 2011-08-03 ENCOUNTER — Other Ambulatory Visit: Payer: Self-pay | Admitting: *Deleted

## 2011-08-03 MED ORDER — DEXLANSOPRAZOLE 60 MG PO CPDR: 60.0000 mg | DELAYED_RELEASE_CAPSULE | Freq: Every day | ORAL | Status: DC

## 2011-08-08 ENCOUNTER — Other Ambulatory Visit: Payer: Self-pay | Admitting: Family Medicine

## 2011-08-11 ENCOUNTER — Other Ambulatory Visit: Payer: Self-pay | Admitting: Family Medicine

## 2011-08-24 ENCOUNTER — Ambulatory Visit (INDEPENDENT_AMBULATORY_CARE_PROVIDER_SITE_OTHER): Payer: 59 | Admitting: Gastroenterology

## 2011-08-24 DIAGNOSIS — E538 Deficiency of other specified B group vitamins: Secondary | ICD-10-CM

## 2011-08-24 MED ORDER — CYANOCOBALAMIN 1000 MCG/ML IJ SOLN
1000.0000 ug | Freq: Once | INTRAMUSCULAR | Status: AC
  Administered 2011-08-24: 1000 ug via INTRAMUSCULAR

## 2011-08-29 ENCOUNTER — Telehealth: Payer: Self-pay | Admitting: Family Medicine

## 2011-08-29 NOTE — Telephone Encounter (Signed)
OK with me.

## 2011-08-29 NOTE — Telephone Encounter (Signed)
Pt requesting to change PCP from Dr. Tawanna Cooler to Dr. Amador Cunas. Please advise

## 2011-08-29 NOTE — Telephone Encounter (Signed)
Dr. Caryl Never pt is looking to switch PCP if Dr. Tawanna Cooler agrees will you accept as a pt?

## 2011-08-29 NOTE — Telephone Encounter (Signed)
Please  Notify  I am not exactly new patients at this time

## 2011-09-02 ENCOUNTER — Ambulatory Visit (INDEPENDENT_AMBULATORY_CARE_PROVIDER_SITE_OTHER): Payer: 59 | Admitting: Internal Medicine

## 2011-09-02 ENCOUNTER — Encounter: Payer: Self-pay | Admitting: Internal Medicine

## 2011-09-02 VITALS — BP 124/80 | Wt 350.0 lb

## 2011-09-02 DIAGNOSIS — F411 Generalized anxiety disorder: Secondary | ICD-10-CM

## 2011-09-02 DIAGNOSIS — E538 Deficiency of other specified B group vitamins: Secondary | ICD-10-CM

## 2011-09-02 DIAGNOSIS — M549 Dorsalgia, unspecified: Secondary | ICD-10-CM

## 2011-09-02 DIAGNOSIS — I10 Essential (primary) hypertension: Secondary | ICD-10-CM

## 2011-09-02 MED ORDER — LISINOPRIL-HYDROCHLOROTHIAZIDE 20-12.5 MG PO TABS: 1.0000 | ORAL_TABLET | Freq: Every day | ORAL | Status: DC

## 2011-09-02 MED ORDER — CLONAZEPAM 1 MG PO TABS: ORAL_TABLET | ORAL | Status: DC

## 2011-09-02 MED ORDER — DEXLANSOPRAZOLE 60 MG PO CPDR: 60.0000 mg | DELAYED_RELEASE_CAPSULE | Freq: Every day | ORAL | Status: DC

## 2011-09-02 MED ORDER — FOLIC ACID 1 MG PO TABS: 1.0000 mg | ORAL_TABLET | Freq: Every day | ORAL | Status: AC

## 2011-09-02 NOTE — Patient Instructions (Signed)
Limit your sodium (Salt) intake  You need to lose weight.  Consider a lower calorie diet and regular exercise.    It is important that you exercise regularly, at least 20 minutes 3 to 4 times per week.  If you develop chest pain or shortness of breath seek  medical attention.  Please check your blood pressure on a regular basis.  If it is consistently greater than 150/90, please make an office appointment.  DASH Diet The DASH diet stands for "Dietary Approaches to Stop Hypertension." It is a healthy eating plan that has been shown to reduce high blood pressure (hypertension) in as little as 14 days, while also possibly providing other significant health benefits. These other health benefits include reducing the risk of breast cancer after menopause and reducing the risk of type 2 diabetes, heart disease, colon cancer, and stroke. Health benefits also include weight loss and slowing kidney failure in patients with chronic kidney disease.   DIET GUIDELINES  Limit salt (sodium). Your diet should contain less than 1500 mg of sodium daily.   Limit refined or processed carbohydrates. Your diet should include mostly whole grains. Desserts and added sugars should be used sparingly.   Include small amounts of heart-healthy fats. These types of fats include nuts, oils, and tub margarine. Limit saturated and trans fats. These fats have been shown to be harmful in the body.  CHOOSING FOODS   The following food groups are based on a 2000 calorie diet. See your Registered Dietitian for individual calorie needs. Grains and Grain Products (6 to 8 servings daily)  Eat More Often: Whole-wheat bread, brown rice, whole-grain or wheat pasta, quinoa, popcorn without added fat or salt (air popped).   Eat Less Often: White bread, white pasta, white rice, cornbread.  Vegetables (4 to 5 servings daily)  Eat More Often: Fresh, frozen, and canned vegetables. Vegetables may be raw, steamed, roasted, or grilled with a  minimal amount of fat.   Eat Less Often/Avoid: Creamed or fried vegetables. Vegetables in a cheese sauce.  Fruit (4 to 5 servings daily)  Eat More Often: All fresh, canned (in natural juice), or frozen fruits. Dried fruits without added sugar. One hundred percent fruit juice ( cup [237 mL] daily).   Eat Less Often: Dried fruits with added sugar. Canned fruit in light or heavy syrup.  [employer], Fish, and Poultry (2 servings or less daily. One serving is 3 to 4 oz [85-114 g]).  Eat More Often: Ninety percent or leaner ground beef, tenderloin, sirloin. Round cuts of beef, chicken breast, [place] breast. All fish. Grill, bake, or broil your meat. Nothing should be fried.   Eat Less Often/Avoid: Fatty cuts of meat, [place], or chicken leg, thigh, or wing. Fried cuts of meat or fish.  Dairy (2 to 3 servings)  Eat More Often: Low-fat or fat-free milk, low-fat plain or light yogurt, reduced-fat or part-skim cheese.   Eat Less Often/Avoid: Milk (whole, 2%, skim, or chocolate). Whole milk yogurt. Full-fat cheeses.  Nuts, Seeds, and Legumes (4 to 5 servings per week)  Eat More Often: All without added salt.   Eat Less Often/Avoid: Salted nuts and seeds, canned beans with added salt.  Fats and Sweets (limited)  Eat More Often: Vegetable oils, tub margarines without trans fats, sugar-free gelatin. Mayonnaise and salad dressings.   Eat Less Often/Avoid: Coconut oils, palm oils, butter, stick margarine, cream, half and half, cookies, candy, pie.  FOR MORE INFORMATION The Dash Diet Eating Plan: www.dashdiet.org Document Released:  02/03/2011 Document Reviewed: 01/24/2011 ExitCare Patient Information 2012 [employer]. 

## 2011-09-02 NOTE — Progress Notes (Signed)
Subjective:    Patient ID: Joseph Valenzuela, male    DOB: March 08, 1975, 36 y.o.   MRN: [id number]  HPI  36 year old patient who is seen today for followup. He has not been seen since 2010. Medical problems include treated hypertension he has been controlled on a combination lisinopril hydrochlorothiazide. She claims compliance and blood pressure seems to be well- controlled on this regimen. He has a history of morbid obesity and chronic low back pain. He states that he has been referred to chronic pain management in the past but does not tolerate analgesics well and declined this referral. He is followed by GI who apparently has been refilling many of his medications he receives monthly B12 injections. He has a history of GERD as well as IBS. He has a long history of anxiety depression and has been followed by Dr. Evelene Croon in the past. Presently he is on Klonopin 1 mg 3 times a day. He states that he has done well on this regimen and has been on a much higher dose in the past. He is seen today basically for medication review and the refills.  Wt Readings from Last 3 Encounters:  09/02/11 350 lb (158.759 kg)  07/05/11 361 lb (163.749 kg)  10/01/10 361 lb (163.749 kg)    Past Medical History  Diagnosis Date  . Chest pain   . Hypertension   . Suicidal ideations   . Back pain, chronic   . Vitamin B12 deficiency   . Edema   . Herniated disc   . Asthma   . Alcoholism   . Depression   . Obesity   . Fatty liver   . Epigastric pain   . Esophageal reflux   . Irritable bowel syndrome   . Benign positional vertigo   . Anxiety   . Degenerative spinal arthritis   . Barrett esophagus   . Hiatal hernia     History   Social History  . Marital Status: Married    Spouse Name: N/A    Number of Children: 0  . Years of Education: N/A   Occupational History  . Environmental consultant novelties   Social History Main Topics  . Smoking status: Never Smoker   . Smokeless tobacco: Never Used  . Alcohol  Use: No     2-3 drinks daily states he has quit  . Drug Use: No     quit 1 year ago  . Sexually Active: Not on file   Other Topics Concern  . Not on file   Social History Narrative  . No narrative on file    No past surgical history on file.  Family History  Problem Relation Age of Onset  . Alcohol abuse Father   . Depression Mother   . Hypertension Brother   . Diabetes Maternal Aunt   . Diabetes Mother   . Stomach cancer Maternal Grandmother   . Brain cancer Maternal Grandfather   . Colon cancer Neg Hx     Allergies  Allergen Reactions  . Codeine Sulfate     REACTION: nausea,itching    Current Outpatient Prescriptions on File Prior to Visit  Medication Sig Dispense Refill  . cyanocobalamin (,VITAMIN B-12,) 1000 MCG/ML injection Inject 1 mL (1,000 mcg total) into the muscle once.  1 mL  0  . DISCONTD: clonazePAM (KLONOPIN) 1 MG tablet Take one tablet by mouth three times a day as needed  90 tablet  5  . DISCONTD: dexlansoprazole (DEXILANT) 60 MG  capsule Take 1 capsule (60 mg total) by mouth daily.  90 capsule  3  . DISCONTD: lisinopril-hydrochlorothiazide (PRINZIDE,ZESTORETIC) 20-12.5 MG per tablet TAKE 1 TABLET BY MOUTH EVERY MORNING  30 tablet  0    BP 124/80  Wt 350 lb (158.759 kg)     Review of Systems  Constitutional: Negative for fever, chills, appetite change and fatigue.  HENT: Negative for hearing loss, ear pain, congestion, sore throat, trouble swallowing, neck stiffness, dental problem, voice change and tinnitus.   Eyes: Negative for pain, discharge and visual disturbance.  Respiratory: Negative for cough, chest tightness, wheezing and stridor.   Cardiovascular: Negative for chest pain, palpitations and leg swelling.  Gastrointestinal: Negative for nausea, vomiting, abdominal pain, diarrhea, constipation, blood in stool and abdominal distention.  Genitourinary: Negative for urgency, hematuria, flank pain, discharge, difficulty urinating and genital  sores.  Musculoskeletal: Positive for back pain. Negative for myalgias, joint swelling, arthralgias and gait problem.  Skin: Negative for rash.  Neurological: Negative for dizziness, syncope, speech difficulty, weakness, numbness and headaches.  Hematological: Negative for adenopathy. Does not bruise/bleed easily.  Psychiatric/Behavioral: Negative for behavioral problems and dysphoric mood. The patient is nervous/anxious.        Objective:   Physical Exam  Constitutional: He is oriented to person, place, and time. He appears well-developed.        Weight 350 Blood pressure 124/80 on arrival 140/80 by my exam  HENT:  Head: Normocephalic.  Right Ear: External ear normal.  Left Ear: External ear normal.  Eyes: Conjunctivae and EOM are normal.  Neck: Normal range of motion.  Cardiovascular: Normal rate and normal heart sounds.   Pulmonary/Chest: Breath sounds normal.  Abdominal: Bowel sounds are normal.  Musculoskeletal: Normal range of motion. He exhibits no edema and no tenderness.  Neurological: He is alert and oriented to person, place, and time.  Psychiatric: He has a normal mood and affect. His behavior is normal.          Assessment & Plan:    Morbid  Obesity.  Additional weight loss encouraged Hypertension reasonable control. Medications refilled. We'll follow up in 6 months with his PCP. Diet weight loss salt restricted diet all encouraged. Information concerning a DASH diet supplied Anxiety depression. Klonopin refilled Chronic low back pain   Recheck 6 months Salt restricted diet Weight loss Exercise  all encouraged  All medications refilled

## 2011-10-07 ENCOUNTER — Encounter: Payer: 59 | Admitting: Gastroenterology

## 2011-10-07 ENCOUNTER — Ambulatory Visit (HOSPITAL_COMMUNITY): Admission: RE | Admit: 2011-10-07 | Payer: 59 | Source: Ambulatory Visit | Admitting: Psychiatry

## 2011-10-07 ENCOUNTER — Other Ambulatory Visit (INDEPENDENT_AMBULATORY_CARE_PROVIDER_SITE_OTHER): Payer: 59 | Admitting: Gastroenterology

## 2011-10-07 DIAGNOSIS — E538 Deficiency of other specified B group vitamins: Secondary | ICD-10-CM

## 2011-10-07 MED ORDER — CYANOCOBALAMIN 1000 MCG/ML IJ SOLN
1000.0000 ug | Freq: Once | INTRAMUSCULAR | Status: AC
  Administered 2011-10-07: 1000 ug via INTRAMUSCULAR

## 2011-12-08 ENCOUNTER — Other Ambulatory Visit: Payer: Self-pay | Admitting: Gastroenterology

## 2011-12-08 NOTE — Telephone Encounter (Signed)
Left message that pt should call his pharmacy for the refill.

## 2011-12-22 ENCOUNTER — Other Ambulatory Visit: Payer: Self-pay | Admitting: Gastroenterology

## 2012-02-13 ENCOUNTER — Other Ambulatory Visit: Payer: Self-pay | Admitting: Internal Medicine

## 2012-02-14 ENCOUNTER — Other Ambulatory Visit: Payer: Self-pay | Admitting: Internal Medicine

## 2012-04-16 ENCOUNTER — Other Ambulatory Visit: Payer: Self-pay | Admitting: Internal Medicine

## 2012-06-12 ENCOUNTER — Other Ambulatory Visit: Payer: Self-pay | Admitting: Gastroenterology

## 2012-06-18 ENCOUNTER — Encounter: Payer: Self-pay | Admitting: *Deleted

## 2012-06-21 ENCOUNTER — Ambulatory Visit: Payer: Self-pay | Admitting: Gastroenterology

## 2012-06-26 ENCOUNTER — Ambulatory Visit: Payer: Self-pay | Admitting: Gastroenterology

## 2012-07-04 ENCOUNTER — Other Ambulatory Visit: Payer: Self-pay | Admitting: Internal Medicine

## 2012-07-06 ENCOUNTER — Other Ambulatory Visit: Payer: Self-pay | Admitting: Internal Medicine

## 2012-07-31 ENCOUNTER — Other Ambulatory Visit: Payer: Self-pay | Admitting: Gastroenterology

## 2012-07-31 NOTE — Telephone Encounter (Signed)
PATIENT WILL NEED AN OFFICE VISIT FOR FURTHER REFILLS  

## 2012-08-07 ENCOUNTER — Ambulatory Visit: Payer: Self-pay | Admitting: Gastroenterology

## 2012-08-09 ENCOUNTER — Other Ambulatory Visit: Payer: Self-pay | Admitting: Internal Medicine

## 2012-08-14 ENCOUNTER — Ambulatory Visit: Payer: Self-pay | Admitting: Gastroenterology

## 2012-08-28 ENCOUNTER — Other Ambulatory Visit: Payer: Self-pay | Admitting: Internal Medicine

## 2012-08-29 ENCOUNTER — Other Ambulatory Visit: Payer: Self-pay | Admitting: Internal Medicine

## 2012-08-29 NOTE — Telephone Encounter (Signed)
Pt needs office follow up.  May refill for #30 only until follow up.

## 2012-08-29 NOTE — Telephone Encounter (Signed)
#  30 called in, pharmacy aware pt needs ov

## 2012-09-05 ENCOUNTER — Encounter: Payer: Self-pay | Admitting: Family Medicine

## 2012-09-05 ENCOUNTER — Ambulatory Visit (INDEPENDENT_AMBULATORY_CARE_PROVIDER_SITE_OTHER): Payer: 59 | Admitting: Family Medicine

## 2012-09-05 VITALS — BP 126/74 | HR 74 | Temp 97.9°F | Ht 72.0 in | Wt 348.0 lb

## 2012-09-05 DIAGNOSIS — E538 Deficiency of other specified B group vitamins: Secondary | ICD-10-CM

## 2012-09-05 DIAGNOSIS — F411 Generalized anxiety disorder: Secondary | ICD-10-CM

## 2012-09-05 DIAGNOSIS — K219 Gastro-esophageal reflux disease without esophagitis: Secondary | ICD-10-CM

## 2012-09-05 DIAGNOSIS — I10 Essential (primary) hypertension: Secondary | ICD-10-CM

## 2012-09-05 MED ORDER — CYANOCOBALAMIN 1000 MCG/ML IJ SOLN
1000.0000 ug | Freq: Once | INTRAMUSCULAR | Status: AC
  Administered 2012-09-05: 1000 ug via INTRAMUSCULAR

## 2012-09-05 MED ORDER — CYANOCOBALAMIN 1000 MCG/ML IJ SOLN: 1000.0000 ug | Freq: Once | INTRAMUSCULAR | Status: DC

## 2012-09-05 MED ORDER — CLONAZEPAM 1 MG PO TABS: 1.0000 mg | ORAL_TABLET | Freq: Three times a day (TID) | ORAL | Status: DC | PRN

## 2012-09-05 NOTE — Patient Instructions (Addendum)

## 2012-09-05 NOTE — Progress Notes (Signed)
  Subjective:    Patient ID: Joseph Valenzuela, male    DOB: Jun 22, 1975, 37 y.o.   MRN: [id number]  HPI Patient seen to reestablish care with me. He has history of reported irritable bowel syndrome, obesity, chronic low back pain, hypertension, asthma, and chronic anxiety disorder. He has long history of reflux and takes chronic proton pump inhibitor.  Has had previous documented low B12 levels. He is requesting B12 injection today. Has taken these  inconsistently in the past secondary to cost. Did not take any consistent oral replacement. Previous elevated TSH 6.6 last year. He states he cannot afford any lab work at this time to reassess levels.  He takes lisinopril HCTZ for hypertension. Blood pressures been well controlled. No recent orthostasis. Reported social anxiety disorder. Currently takes clonazepam. Question history of panic disorder. He states he's been intolerant of multiple SSRI medications in the past.  Past Medical History  Diagnosis Date  . Chest pain   . Hypertension   . Suicidal ideations   . Back pain, chronic   . Vitamin B12 deficiency   . Edema   . Herniated disc   . Asthma   . Alcoholism   . Depression   . Obesity   . Fatty liver   . Epigastric pain   . Esophageal reflux   . Irritable bowel syndrome   . Benign positional vertigo   . Anxiety   . Degenerative spinal arthritis   . Barrett esophagus   . Hiatal hernia    No past surgical history on file.  reports that he has never smoked. He has never used smokeless tobacco. He reports that he does not drink alcohol or use illicit drugs. family history includes Alcohol abuse in his father; Brain cancer in his maternal grandfather; Depression in his mother; Diabetes in his maternal aunt and mother; Hypertension in his brother; and Stomach cancer in his maternal grandmother.  There is no history of Colon cancer. Allergies  Allergen Reactions  . Codeine Sulfate     REACTION: nausea,itching       Review of  Systems  Constitutional: Negative for fatigue.  HENT: Negative for trouble swallowing.   Eyes: Negative for visual disturbance.  Respiratory: Negative for cough, chest tightness and shortness of breath.   Cardiovascular: Negative for chest pain, palpitations and leg swelling.  Neurological: Negative for dizziness, syncope, weakness, light-headedness and headaches.  Psychiatric/Behavioral: Positive for sleep disturbance. Negative for suicidal ideas and dysphoric mood. The patient is nervous/anxious.        Objective:   Physical Exam  Constitutional: He appears well-developed and well-nourished.  Neck: Neck supple.  Cardiovascular: Normal rate and regular rhythm.   Pulmonary/Chest: Effort normal and breath sounds normal. No respiratory distress. He has no wheezes. He has no rales.  Musculoskeletal: He exhibits no edema.  Psychiatric: He has a normal mood and affect. His behavior is normal.          Assessment & Plan   #1 hypertension. Stable. Continue current medication. #2 chronic anxiety. Reported social anxiety and panic disorder. We've recommended consideration for SSRI but he is very reluctant. He states he's had intolerance to many in the past #3 history of B12 deficiency. Patient requesting injection. This has been documented low in the past. #4 history of chronic GERD stable on proton pump inhibitor

## 2012-10-26 ENCOUNTER — Other Ambulatory Visit: Payer: Self-pay | Admitting: Gastroenterology

## 2012-11-07 ENCOUNTER — Telehealth: Payer: Self-pay | Admitting: Gastroenterology

## 2012-11-07 MED ORDER — DEXLANSOPRAZOLE 60 MG PO CPDR: 60.0000 mg | DELAYED_RELEASE_CAPSULE | Freq: Every day | ORAL | Status: DC

## 2012-11-07 NOTE — Telephone Encounter (Signed)
RX sent

## 2012-11-19 ENCOUNTER — Other Ambulatory Visit: Payer: Self-pay | Admitting: Internal Medicine

## 2012-12-04 ENCOUNTER — Ambulatory Visit (INDEPENDENT_AMBULATORY_CARE_PROVIDER_SITE_OTHER): Payer: 59 | Admitting: Gastroenterology

## 2012-12-04 ENCOUNTER — Other Ambulatory Visit (INDEPENDENT_AMBULATORY_CARE_PROVIDER_SITE_OTHER): Payer: 59

## 2012-12-04 ENCOUNTER — Telehealth: Payer: Self-pay | Admitting: *Deleted

## 2012-12-04 ENCOUNTER — Other Ambulatory Visit: Payer: Self-pay | Admitting: Gastroenterology

## 2012-12-04 ENCOUNTER — Encounter: Payer: Self-pay | Admitting: Gastroenterology

## 2012-12-04 VITALS — BP 120/82 | HR 60 | Ht 72.0 in | Wt 340.0 lb

## 2012-12-04 DIAGNOSIS — E538 Deficiency of other specified B group vitamins: Secondary | ICD-10-CM

## 2012-12-04 DIAGNOSIS — R1012 Left upper quadrant pain: Secondary | ICD-10-CM

## 2012-12-04 DIAGNOSIS — K589 Irritable bowel syndrome without diarrhea: Secondary | ICD-10-CM

## 2012-12-04 LAB — CBC WITH DIFFERENTIAL/PLATELET
Basophils Absolute: 0 10*3/uL (ref 0.0–0.1)
Eosinophils Absolute: 0.1 10*3/uL (ref 0.0–0.7)
HCT: 43.3 % (ref 39.0–52.0)
Hemoglobin: 14.4 g/dL (ref 13.0–17.0)
Lymphs Abs: 2.6 10*3/uL (ref 0.7–4.0)
MCHC: 33.3 g/dL (ref 30.0–36.0)
MCV: 85.6 fl (ref 78.0–100.0)
Monocytes Absolute: 0.5 10*3/uL (ref 0.1–1.0)
Monocytes Relative: 6.5 % (ref 3.0–12.0)
Neutro Abs: 3.9 10*3/uL (ref 1.4–7.7)
Platelets: 272 10*3/uL (ref 150.0–400.0)
RDW: 16 % — ABNORMAL HIGH (ref 11.5–14.6)

## 2012-12-04 LAB — LIPASE: Lipase: 16 U/L (ref 11.0–59.0)

## 2012-12-04 LAB — BASIC METABOLIC PANEL
BUN: 7 mg/dL (ref 6–23)
Calcium: 9.7 mg/dL (ref 8.4–10.5)
GFR: 123.82 mL/min (ref >60.00)
Glucose, Bld: 100 mg/dL — ABNORMAL HIGH (ref 70–99)
Potassium: 3.5 mEq/L (ref 3.5–5.1)
Sodium: 139 mEq/L (ref 135–145)

## 2012-12-04 LAB — TSH: TSH: 6.1 u[IU]/mL — ABNORMAL HIGH (ref 0.35–5.50)

## 2012-12-04 LAB — HEPATIC FUNCTION PANEL
AST: 51 U/L — ABNORMAL HIGH (ref 0–37)
Albumin: 4.3 g/dL (ref 3.5–5.2)
Alkaline Phosphatase: 96 U/L (ref 39–117)
Bilirubin, Direct: 0.2 mg/dL (ref 0.0–0.3)

## 2012-12-04 LAB — AMYLASE: Amylase: 32 U/L (ref 27–131)

## 2012-12-04 MED ORDER — CYANOCOBALAMIN 1000 MCG/ML IJ SOLN
1000.0000 ug | Freq: Once | INTRAMUSCULAR | Status: AC
  Administered 2012-12-04: 1000 ug via INTRAMUSCULAR

## 2012-12-04 MED ORDER — CILIDINIUM-CHLORDIAZEPOXIDE 2.5-5 MG PO CAPS: 1.0000 | ORAL_CAPSULE | Freq: Three times a day (TID) | ORAL | Status: DC | PRN

## 2012-12-04 NOTE — Progress Notes (Signed)
This is a chronic patient with IBS manifested mostly by alternating diarrhea and constipation, gas, bloating, and recurrent left upper quadrant pain.  Ultrasound the abdomen were reviewed from 2009 was unremarkable.  He has multiple anxiety related problems treated with Klonopin per psychiatry.  He's had recent general physical exam and checkup with Dr. Lesia Hausen and primary care.  He continues to complain of intermittent severe left upper quadrant pain radiating into his back without upper GI or hepatobiliary problems.  He is on chronic Dexilant 60 mg a day for GERD.  He denies true reflux symptoms or dysphagia.  He continues to use alcohol" socially", and denies binge drinking or any history of hepatitis or pancreatitis.  His voluntarily lost 25 pounds in weight primary care request.  He does have essential hypertension and takes daily blood pressure medication.  He denies abuse of NSAIDs.  Despite all these complaints, she's had no melena, hematochezia, fever, chills, or other systemic complaints.  Current Medications, Allergies, Past Medical History, Past Surgical History, Family History and Social History were reviewed in [employer] record.  ROS: All systems were reviewed and are negative unless otherwise stated in the HPI.          Physical Exam: Morbidly obese patient in no acute distress.  Blood pressure 120/82, pulse 60 and regular and weight 340 pounds with BMI of 46.1.  His abdomen shows exogenous obesity without definite organomegaly, masses or localized tenderness.  Bowel sounds are nonobstructive.  Mental status is normal.    Assessment and Plan: Probable IBS and gas trapping in the left upper quadrant day splenic flexure areas:.  I've asked him to continue fiber supplements have placed him on a FOD-MAP diet for IBS when necessary Librax 2-3 times a day as needed.  We will repeat his labs including amylase, lipase, liver profile and celiac profile.  Repeat  ultrasound of his upper abdomen also scheduled.  He is to continue other medications as per his multiple physicians.  He may need colonoscopy exam depending on his clinical workup and clinical course.

## 2012-12-04 NOTE — Patient Instructions (Addendum)
Your physician has requested that you go to the basement for lab work before leaving today.  You have been scheduled for an abdominal ultrasound at [facility] (1st floor of hospital) on 12/06/12 at 8:00am. Please arrive 15 minutes prior to your appointment for registration. Make certain not to have anything to eat or drink 6 hours prior to your appointment. Should you need to reschedule your appointment, please contact radiology at [phone]. This test typically takes about 30 minutes to perform.  You have been given a B12 injection today.  You have been given a [place] diet.  We have sent the following medications to your pharmacy for you to pick up at your convenience: Librax to take one tablet by mouth three times a day as needed.

## 2012-12-05 LAB — CELIAC PANEL 10
Endomysial Screen: NEGATIVE
IgA: 237 mg/dL (ref 68–379)
Tissue Transglut Ab: 2.7 U/mL (ref <20)

## 2012-12-05 LAB — T3, FREE: T3, Free: 3.7 pg/mL (ref 2.3–4.2)

## 2012-12-05 NOTE — Progress Notes (Signed)
Toal T4 and free T3 needed

## 2012-12-05 NOTE — Progress Notes (Signed)
Total T4 and free T3 needed

## 2012-12-06 ENCOUNTER — Ambulatory Visit (HOSPITAL_COMMUNITY)
Admission: RE | Admit: 2012-12-06 | Discharge: 2012-12-06 | Disposition: A | Discharge status: Home / Self Care | Payer: 59 | Source: Ambulatory Visit | Attending: Gastroenterology | Admitting: Gastroenterology

## 2012-12-06 DIAGNOSIS — E669 Obesity, unspecified: Secondary | ICD-10-CM | POA: Insufficient documentation

## 2012-12-06 DIAGNOSIS — R109 Unspecified abdominal pain: Secondary | ICD-10-CM | POA: Insufficient documentation

## 2012-12-06 DIAGNOSIS — R161 Splenomegaly, not elsewhere classified: Secondary | ICD-10-CM | POA: Insufficient documentation

## 2012-12-06 DIAGNOSIS — K7689 Other specified diseases of liver: Secondary | ICD-10-CM | POA: Insufficient documentation

## 2012-12-06 NOTE — Telephone Encounter (Signed)
------    Notes Recorded by Mardella Layman, MD on 12/06/2012 at 9:58 AM Consent exam shows fatty liver. 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 I suspect this is associated with his fatty liver. Asked him to continue to proceed with weight loss as plan with followup ultrasound in 6 months. CBC reviewed and is normal,repeat 3 mos.      Notes Recorded by Linna Hoff, RN on 12/06/2012 at 4:35 PM Informed pt of results in telephone encounter. Dr Jarold Motto, pt wants more information on the enlarged spleen and should he worry about it "bursting"? If the pain worsens, should he seek medical attention? Thanks, please advise.

## 2012-12-07 NOTE — Telephone Encounter (Signed)
Informed pt that Dr Jarold Motto does not feel he should worry about his spleen bursting. Pt states he has been up all night worrying about whether he has a serious condition. We discussed the fact Dr Jarold Motto is informing him so he will try to turn his life around before his condition possibly deteriorates to cirrhosis. Dr Jarold Motto wants him to lose weight and watch his alcohol intake to decrease his fatty liver as well as decrease the size of his spleen. Pt states he is trying, he was just overwhelmed after looking at [facility] and other web sites. He stated understanding after our conversation and will call for more questions or problems.

## 2012-12-07 NOTE — Telephone Encounter (Signed)
No and no

## 2012-12-13 ENCOUNTER — Other Ambulatory Visit: Payer: Self-pay | Admitting: Gastroenterology

## 2012-12-17 ENCOUNTER — Other Ambulatory Visit: Payer: Self-pay

## 2012-12-17 MED ORDER — DEXLANSOPRAZOLE 60 MG PO CPDR: DELAYED_RELEASE_CAPSULE | ORAL | Status: DC

## 2013-01-20 ENCOUNTER — Other Ambulatory Visit: Payer: Self-pay | Admitting: Internal Medicine

## 2013-02-14 ENCOUNTER — Other Ambulatory Visit (INDEPENDENT_AMBULATORY_CARE_PROVIDER_SITE_OTHER): Payer: 59

## 2013-02-14 ENCOUNTER — Telehealth: Payer: Self-pay | Admitting: Gastroenterology

## 2013-02-14 ENCOUNTER — Other Ambulatory Visit: Payer: Self-pay | Admitting: Internal Medicine

## 2013-02-14 ENCOUNTER — Other Ambulatory Visit: Payer: Self-pay | Admitting: Family Medicine

## 2013-02-14 DIAGNOSIS — R109 Unspecified abdominal pain: Secondary | ICD-10-CM

## 2013-02-14 LAB — HEPATIC FUNCTION PANEL
ALT: 44 U/L (ref 0–53)
AST: 58 U/L — ABNORMAL HIGH (ref 0–37)
Alkaline Phosphatase: 116 U/L (ref 39–117)
Bilirubin, Direct: 0.3 mg/dL (ref 0.0–0.3)
Total Bilirubin: 1.9 mg/dL — ABNORMAL HIGH (ref 0.3–1.2)

## 2013-02-14 LAB — CBC WITH DIFFERENTIAL/PLATELET
Basophils Absolute: 0.1 10*3/uL (ref 0.0–0.1)
Basophils Relative: 0.6 % (ref 0.0–3.0)
Eosinophils Absolute: 0.1 10*3/uL (ref 0.0–0.7)
Eosinophils Relative: 1.5 % (ref 0.0–5.0)
Lymphocytes Relative: 23.1 % (ref 12.0–46.0)
MCHC: 33.3 g/dL (ref 30.0–36.0)
Monocytes Relative: 4.6 % (ref 3.0–12.0)
Neutrophils Relative %: 70.2 % (ref 43.0–77.0)
RBC: 5.42 Mil/uL (ref 4.22–5.81)
RDW: 16.1 % — ABNORMAL HIGH (ref 11.5–14.6)

## 2013-02-14 LAB — BASIC METABOLIC PANEL
BUN: 8 mg/dL (ref 6–23)
CO2: 30 mEq/L (ref 19–32)
Chloride: 98 mEq/L (ref 96–112)
Creatinine, Ser: 0.8 mg/dL (ref 0.4–1.5)
Glucose, Bld: 98 mg/dL (ref 70–99)
Sodium: 138 mEq/L (ref 135–145)

## 2013-02-14 NOTE — Telephone Encounter (Signed)
Pt reports pain since yesterday across his upper abdomen from " his liver to his spleen". At 1st, he thought it was gas, but when he lies ddown or stands, the pain moves from side to side. He remembered he had Librax so he took one with slight relief. Pt wanted to know if he could come for labs, so Doug Sou, PA ok'd a CBC, Hepatic, and BMET. Pt stated understanding.

## 2013-02-14 NOTE — Telephone Encounter (Signed)
Refill for 6 months. 

## 2013-02-14 NOTE — Telephone Encounter (Signed)
Last visit 09-05-12 Last refill 09-05-12 #90 5 refills

## 2013-03-08 ENCOUNTER — Ambulatory Visit: Payer: Self-pay | Admitting: Family Medicine

## 2013-03-15 ENCOUNTER — Ambulatory Visit: Payer: 59 | Admitting: Family Medicine

## 2013-03-19 ENCOUNTER — Ambulatory Visit: Payer: 59 | Admitting: Family Medicine

## 2013-03-26 ENCOUNTER — Ambulatory Visit (INDEPENDENT_AMBULATORY_CARE_PROVIDER_SITE_OTHER): Payer: 59 | Admitting: Family Medicine

## 2013-03-26 ENCOUNTER — Encounter: Payer: Self-pay | Admitting: Family Medicine

## 2013-03-26 VITALS — BP 126/70 | HR 102 | Temp 98.5°F | Wt 329.2 lb

## 2013-03-26 DIAGNOSIS — N529 Male erectile dysfunction, unspecified: Secondary | ICD-10-CM

## 2013-03-26 DIAGNOSIS — I10 Essential (primary) hypertension: Secondary | ICD-10-CM

## 2013-03-26 DIAGNOSIS — F411 Generalized anxiety disorder: Secondary | ICD-10-CM

## 2013-03-26 DIAGNOSIS — E538 Deficiency of other specified B group vitamins: Secondary | ICD-10-CM

## 2013-03-26 MED ORDER — CYANOCOBALAMIN 1000 MCG/ML IJ SOLN
1000.0000 ug | Freq: Once | INTRAMUSCULAR | Status: AC
  Administered 2013-03-26: 1000 ug via INTRAMUSCULAR

## 2013-03-26 MED ORDER — SILDENAFIL CITRATE 100 MG PO TABS: 50.0000 mg | ORAL_TABLET | Freq: Every day | ORAL | Status: DC | PRN

## 2013-03-26 NOTE — Progress Notes (Signed)
   Subjective:    Patient ID: Joseph Valenzuela, male    DOB: 09/28/1975, 38 y.o.   MRN: [id number]  HPI Patient here for medical followup. He has hypertension which is treated with lisinopril HCTZ. Blood pressure has been stable. He has history of morbid obesity and has lost about 20 pounds since last seen here due to his efforts. He has history of IBS and fatty liver changes. hopes to lose more weight. He also has history of chronic anxiety and takes clonazepam 1 mg 3 times a day. Anxiety symptoms stable. He does not use any alcohol.  He relates recent stress issues with marriage. They've been to counseling without much resolution.  He complains of erectile dysfunction. Requesting medication. He has no history of nitroglycerin use. No peripheral vascular issues. Nonsmoker.  Past Medical History  Diagnosis Date  . Chest pain   . Hypertension   . Suicidal ideations   . Back pain, chronic   . Vitamin B12 deficiency   . Edema   . Herniated disc   . Asthma   . Alcoholism   . Depression   . Obesity   . Fatty liver   . Epigastric pain   . Esophageal reflux   . Irritable bowel syndrome   . Benign positional vertigo   . Anxiety   . Degenerative spinal arthritis   . Barrett esophagus   . Hiatal hernia    No past surgical history on file.  reports that he has never smoked. He has never used smokeless tobacco. He reports that he does not drink alcohol or use illicit drugs. family history includes Alcohol abuse in his father; Brain cancer in his maternal grandfather; Depression in his mother; Diabetes in his maternal aunt and mother; Hypertension in his brother; Stomach cancer in his maternal grandmother. There is no history of Colon cancer. Allergies  Allergen Reactions  . Codeine Sulfate     REACTION: nausea,itching      Review of Systems  Eyes: Negative for visual disturbance.  Respiratory: Negative for cough, chest tightness and shortness of breath.   Cardiovascular:  Negative for chest pain, palpitations and leg swelling.  Neurological: Negative for dizziness, syncope, weakness, light-headedness and headaches.       Objective:   Physical Exam  Constitutional: He appears well-developed and well-nourished.  Neck: Neck supple. No thyromegaly present.  Cardiovascular: Normal rate.   Pulmonary/Chest: Effort normal and breath sounds normal. No respiratory distress. He has no wheezes. He has no rales.  Musculoskeletal: He exhibits no edema.  Neurological: He is alert.  Psychiatric: He has a normal mood and affect. His behavior is normal.          Assessment & Plan:  #1 hypertension. Stable. Continue current medications. Continue weight loss efforts. #2 erectile dysfunction. Trial of Viagra 100 mg one half to one tablet daily as needed. He is at risk for low testosterone with his obesity and hopefully this will improve with continued weight loss efforts #3 chronic anxiety which is stable for several years on clonazepam with no history of issues. Has had significant counseling in the past #4 obesity.  He has had some weigh loss in the past year and have recommended continued efforts.

## 2013-03-26 NOTE — Progress Notes (Signed)
Pre visit review using our clinic review tool, if applicable. No additional management support is needed unless otherwise documented below in the visit note. 

## 2013-03-26 NOTE — Patient Instructions (Signed)

## 2013-03-27 ENCOUNTER — Telehealth: Payer: Self-pay | Admitting: Family Medicine

## 2013-03-27 NOTE — Telephone Encounter (Signed)
Relevant patient education assigned to patient using Emmi. ° °

## 2013-05-29 ENCOUNTER — Telehealth: Payer: Self-pay | Admitting: *Deleted

## 2013-05-29 NOTE — Telephone Encounter (Signed)
Message copied by Daphine DeutscherMILLER, Pinki Rottman N on Wed May 29, 2013  8:30 AM ------      Message from: Ok AnisSMITH, KELLY A      Created: Thu Apr 18, 2013  2:59 PM       Notes Recorded by Mardella Laymanavid R Patterson, MD on 12/06/2012 at 9:58 AM      Consent exam shows fatty liver.  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  I suspect this is associated with his fatty liver.  Asked him to continue to proceed with weight loss as plan with followup ultrasound in 6 months.  CBC reviewed and is normal,repeat 3 mos.            Notes Recorded by Linna Hoffynthia Kay Bradley, RN on 02/15/2013 at 4:23 PM      Informed pt his labs are normal and stable. He states he remains bloated, but the Librax helps; states he hasn't had a BM in 3 days. Suggested he take Miralax for a BM and see if the bloating improves and continue the Librax over the weekend. Pt stated understanding.      ______________________      Patient had CBC in December note above ------

## 2013-05-29 NOTE — Telephone Encounter (Signed)
Spoke with patient and he only requests a male physician. He is due for ultrasound 6 month f/u for fatty liver. He also states he needs repeat labs. Scheduled with Willette ClusterPaula Guenther, NP to establish care and discuss labs needed.

## 2013-06-06 ENCOUNTER — Ambulatory Visit: Payer: Self-pay | Admitting: Nurse Practitioner

## 2013-06-08 ENCOUNTER — Other Ambulatory Visit: Payer: Self-pay | Admitting: Family Medicine

## 2013-06-17 ENCOUNTER — Ambulatory Visit (INDEPENDENT_AMBULATORY_CARE_PROVIDER_SITE_OTHER): Payer: 59 | Admitting: Nurse Practitioner

## 2013-06-17 ENCOUNTER — Other Ambulatory Visit (INDEPENDENT_AMBULATORY_CARE_PROVIDER_SITE_OTHER): Payer: 59

## 2013-06-17 ENCOUNTER — Encounter: Payer: Self-pay | Admitting: Nurse Practitioner

## 2013-06-17 VITALS — BP 150/90 | HR 100 | Ht 72.0 in | Wt 330.0 lb

## 2013-06-17 DIAGNOSIS — R161 Splenomegaly, not elsewhere classified: Secondary | ICD-10-CM

## 2013-06-17 DIAGNOSIS — R109 Unspecified abdominal pain: Secondary | ICD-10-CM

## 2013-06-17 DIAGNOSIS — F1020 Alcohol dependence, uncomplicated: Secondary | ICD-10-CM

## 2013-06-17 DIAGNOSIS — K589 Irritable bowel syndrome without diarrhea: Secondary | ICD-10-CM

## 2013-06-17 LAB — CBC
HCT: 44.8 % (ref 39.0–52.0)
Hemoglobin: 14.9 g/dL (ref 13.0–17.0)
MCHC: 33.4 g/dL (ref 30.0–36.0)
MCV: 90.7 fl (ref 78.0–100.0)
PLATELETS: 244 10*3/uL (ref 150.0–400.0)
RBC: 4.94 Mil/uL (ref 4.22–5.81)
RDW: 18.1 % — ABNORMAL HIGH (ref 11.5–14.6)
WBC: 6.7 10*3/uL (ref 4.5–10.5)

## 2013-06-17 LAB — COMPREHENSIVE METABOLIC PANEL
ALK PHOS: 117 U/L (ref 39–117)
ALT: 70 U/L — ABNORMAL HIGH (ref 0–53)
AST: 100 U/L — AB (ref 0–37)
Albumin: 4.3 g/dL (ref 3.5–5.2)
BUN: 6 mg/dL (ref 6–23)
CHLORIDE: 97 meq/L (ref 96–112)
CO2: 30 mEq/L (ref 19–32)
Calcium: 9.6 mg/dL (ref 8.4–10.5)
Creatinine, Ser: 0.6 mg/dL (ref 0.4–1.5)
GFR: 150.52 mL/min (ref >60.00)
GLUCOSE: 113 mg/dL — AB (ref 70–99)
POTASSIUM: 3.5 meq/L (ref 3.5–5.1)
SODIUM: 139 meq/L (ref 135–145)
TOTAL PROTEIN: 8.2 g/dL (ref 6.0–8.3)
Total Bilirubin: 1.6 mg/dL — ABNORMAL HIGH (ref 0.3–1.2)

## 2013-06-17 LAB — LIPASE: Lipase: 27 U/L (ref 11.0–59.0)

## 2013-06-17 LAB — AMYLASE: Amylase: 35 U/L (ref 27–131)

## 2013-06-17 MED ORDER — TRAMADOL HCL 50 MG PO TABS: 50.0000 mg | ORAL_TABLET | Freq: Four times a day (QID) | ORAL | Status: DC | PRN

## 2013-06-17 NOTE — Patient Instructions (Addendum)
You have been scheduled for a CT scan of the abdomen and pelvis at [facility] ([address]---this is in the same building as [facility]).   You are scheduled on 06-18-2013 at 930 am. You should arrive 15 minutes prior to your appointment time for registration. Please follow the written instructions below on the day of your exam:  WARNING: IF YOU ARE ALLERGIC TO IODINE/X-RAY DYE, PLEASE NOTIFY RADIOLOGY IMMEDIATELY AT [phone]! YOU WILL BE GIVEN A 13 HOUR PREMEDICATION PREP.  1) Do not eat or drink anything after 530 am (4 hours prior to your test) 2) You have been given 2 bottles of oral contrast to drink. The solution may taste better if refrigerated, but do NOT add ice or any other liquid to this solution. Shake well before drinking.    Drink 1 bottle of contrast @ 730 am (2 hours prior to your exam)  Drink 1 bottle of contrast @ 830 am (1 hour prior to your exam)  You may take any medications as prescribed with a small amount of water except for the following: Metformin, Glucophage, Glucovance, Avandamet, Riomet, Fortamet, Actoplus Met, Janumet, Glumetza or Metaglip. The above medications must be held the day of the exam AND 48 hours after the exam.  The purpose of you drinking the oral contrast is to aid in the visualization of your intestinal tract. The contrast solution may cause some diarrhea. Before your exam is started, you will be given a small amount of fluid to drink. Depending on your individual set of symptoms, you may also receive an intravenous injection of x-ray contrast/dye. Plan on being at [facility] for 30 minutes or long, depending on the type of exam you are having performed.  This test typically takes 30-45 minutes to complete.  If you have any questions regarding your exam or if you need to reschedule, you may call the CT department at [phone] between the hours of 8:00 am and 5:00 pm,  Monday-Friday. _________________________________________________________________________________________________________________________________________________________________________________________________  Please stop alcohol  We have sent the following medications to your pharmacy for you to pick up at your convenience: Tramadol 50 mg, please take every six hours as needed for pain   Your physician has requested that you go to the basement for the following lab work before leaving today: CBC CMET Amylase Lipase  Please stay on clear liquids until we have results of CT Scan back _____________________________________________________________________________________________                Clear Liquids   The clear liquid diet consists of foods that are liquid or will become liquid at room temperature. Examples of foods allowed on a clear liquid diet include fruit juice, broth or bouillon, gelatin, or frozen ice pops. You should be able to see through the liquid.  The purpose of this diet is to provide the necessary fluids, electrolytes (such as sodium and potassium), and energy to keep the body functioning during times when you are not able to consume a regular diet. A clear liquid diet should not be continued for long periods of time, as it is not nutritionally adequate.  A CLEAR LIQUID DIET MAY BE NEEDED:  When a sudden-onset (acute) condition occurs before or after surgery.   As the first step in oral feeding.   For fluid and electrolyte replacement in diarrheal diseases.   As a diet before certain medical tests are performed.  ADEQUACY The clear liquid diet is adequate only in ascorbic acid, according to the  Recommended Dietary Allowances of the [employer].  CHOOSING FOODS Breads and Starches  Allowed: None are allowed.   Avoid: All are to be avoided.  Vegetables  Allowed: Strained vegetable juices.   Avoid: Any others.   Fruit  Allowed: Strained fruit juices and fruit drinks. Include 1 serving of citrus or vitamin C-enriched fruit juice daily.   Avoid: Any others.  Meat and Meat Substitutes  Allowed: None are allowed.   Avoid: All are to be avoided.  Milk Products  Allowed: None are allowed.   Avoid: All are to be avoided.  Soups and Combination Foods  Allowed: Clear bouillon, broth, or strained broth-based soups.   Avoid: Any others.  Desserts and Sweets  Allowed: Sugar, honey. High-protein gelatin. Flavored gelatin, ices, or frozen ice pops that do not contain milk.   Avoid: Any others.  Fats and Oils  Allowed: None are allowed.   Avoid: All are to be avoided.  Beverages  Allowed: Cereal beverages, coffee (regular or decaffeinated), tea, or soda at the discretion of your health care provider.   Avoid: Any others.  Condiments  Allowed: Salt.   Avoid: Any others, including pepper.  Supplements  Allowed: Liquid nutrition beverages that you can see through.   Avoid: Any others that contain lactose or fiber. SAMPLE MEAL PLAN Breakfast  4 oz (120 mL) strained orange juice.   to 1 cup (120 to 240 mL) gelatin (plain or fortified).  1 cup (240 mL) beverage (coffee or tea).  Sugar, if desired. Midmorning Snack   cup (120 mL) gelatin (plain or fortified). Lunch  1 cup (240 mL) broth or consomm.  4 oz (120 mL) strained grapefruit juice.   cup (120 mL) gelatin (plain or fortified).  1 cup (240 mL) beverage (coffee or tea).  Sugar, if desired. Midafternoon Snack   cup (120 mL) fruit ice.   cup (120 mL) strained fruit juice. Dinner  1 cup (240 mL) broth or consomm.   cup (120 mL) cranberry juice.   cup (120 mL) flavored gelatin (plain or fortified).  1 cup (240 mL) beverage (coffee or tea).  Sugar, if desired. Evening Snack  4 oz (120 mL) strained apple juice (vitamin C-fortified).   cup (120 mL)  flavored gelatin (plain or fortified). MAKE SURE YOU:  Understand these instructions.  Will watch your child's condition.  Will get help right away if your child is not doing well or gets worse. Document Released: 02/14/2005 Document Revised: 10/17/2012 Document Reviewed: 07/17/2012 [facility] Patient Information 2014 [place].

## 2013-06-17 NOTE — Progress Notes (Signed)
     History of Present Illness:  Patient is a 38 year old male, formerly followed by Dr. Jarold MottoPatterson. He has a history of GERD/ Barrett's esophagus (2009), fatty liver disease and irritable bowel syndrome characterized by alternating constipation and diarrhea. Patient also has a history of morbid obesity/ chronic anxiety and depression, as well as alcoholism. He was sober but recently started drinking again. He drinks about 3 times a week, usually 2-3 whiskey based drinks at a time.   Patient gives a 2-3 weeks history or progressive LUQ pain associated with diffuse abdominal distention. He saw Dr. Jarold MottoPatterson for similar pain in October 2014 and was advised to take Librax. Pain worse with any PO intake, it affects his sleep and is much worse than when seen in October. It hurts to take a deep breath. Patient restarted Librax 2-3 weeks ago but it hasn't really helped. He doesn't take NSAIDS and is on a daily PPI. No associated nausea. BMs are irregular with alternating constipation and diarrhea patient doesn't feel constipated. Ultrasound October 2014 showed an enlarged spleen. Patient was supposed to have followup ultrasound January 2015.  Patient gives a lifelong history of periumbilical tenderness but now feels a tender periumbilical mass  Current Medications, Allergies, Past Medical History, Past Surgical History, Family History and Social History were reviewed in [facility] electronic medical record.  Physical Exam: General: Pleasant, obese white male in no acute distress Head: Normocephalic and atraumatic Eyes:  sclerae anicteric, conjunctiva pink  Ears: Normal auditory acuity Lungs: Clear throughout to auscultation Heart: Regular rate and rhythm Abdomen: Soft, obese, non distended, hypoactive bowel sounds.  No obvious masses felt. Moderate LUQ tenderness. Could not appreciate periumbilical mass.  Musculoskeletal: Symmetrical with no gross deformities  Extremities: No edema  Neurological:  Alert oriented x 4, grossly nonfocal Psychological:  Alert and cooperative. Normal mood and affect Skin: multiple tatoos. Slightly diaphoretic.   Assessment and Recommendations:  631. 38 year old male with exacerbation of his chronic, intermittent LUQ pain. Pain worse than previous episodes, it hurts to take a deep breath, he cannot sleep because of the pain. Previously there was a question of whether his enlarged spleen was causing the pain. Patient had u/s in October, no other abdominal imaging. For further evaluation will obtain CTscan as well as CBC, CMET, amylase and lipase. Librax not helping, will try Ultram. Recommend clear liquids until some of his test results return. Will call him with results.   2. Barrett's esophagus, on daily PPI  3. Chronic IBS  4. Alcoholism. He is drinking about 3 times a week now. No ETOH with ultram.   5. Periumbilical tenderness (chronic) and now with sensation of mass just above umbilicus. I could not appreciate mass on exam. Will see if CTscan shows anything.   Dr. Leone PayorGessner will assume patient's care since Dr. Jarold MottoPatterson has retired.

## 2013-06-18 ENCOUNTER — Ambulatory Visit (INDEPENDENT_AMBULATORY_CARE_PROVIDER_SITE_OTHER)
Admission: RE | Admit: 2013-06-18 | Discharge: 2013-06-18 | Disposition: A | Discharge status: Home / Self Care | Payer: 59 | Source: Ambulatory Visit | Attending: Nurse Practitioner | Admitting: Nurse Practitioner

## 2013-06-18 DIAGNOSIS — R109 Unspecified abdominal pain: Secondary | ICD-10-CM

## 2013-06-18 DIAGNOSIS — R161 Splenomegaly, not elsewhere classified: Secondary | ICD-10-CM

## 2013-06-18 MED ORDER — IOHEXOL 300 MG/ML  SOLN
100.0000 mL | Freq: Once | INTRAMUSCULAR | Status: AC | PRN
  Administered 2013-06-18: 100 mL via INTRAVENOUS

## 2013-06-18 NOTE — Progress Notes (Signed)
Agree with Ms. Guenther's assessment and plan. Carl E. Gessner, MD, FACG   

## 2013-06-19 ENCOUNTER — Other Ambulatory Visit: Payer: Self-pay | Admitting: *Deleted

## 2013-06-19 MED ORDER — DICYCLOMINE HCL 10 MG PO CAPS: ORAL_CAPSULE | ORAL | Status: DC

## 2013-07-18 ENCOUNTER — Telehealth: Payer: Self-pay | Admitting: Family Medicine

## 2013-07-18 NOTE — Telephone Encounter (Signed)
[id number] - , [place] - [address] is requesting re-fill on clonazePAM (KLONOPIN) 1 MG tablet

## 2013-07-18 NOTE — Telephone Encounter (Signed)
Refill for 6 months. 

## 2013-07-18 NOTE — Telephone Encounter (Signed)
Last visit 03/26/13 Last refill 02/14/13 #90 5 refill

## 2013-07-19 MED ORDER — CLONAZEPAM 1 MG PO TABS: ORAL_TABLET | ORAL | Status: DC

## 2013-07-19 NOTE — Telephone Encounter (Signed)
Rx called in to pharmacy. 

## 2013-09-24 ENCOUNTER — Ambulatory Visit (INDEPENDENT_AMBULATORY_CARE_PROVIDER_SITE_OTHER): Payer: 59 | Admitting: Family Medicine

## 2013-09-24 ENCOUNTER — Encounter: Payer: Self-pay | Admitting: Family Medicine

## 2013-09-24 VITALS — BP 140/84 | HR 87 | Temp 98.1°F | Wt 342.0 lb

## 2013-09-24 DIAGNOSIS — N528 Other male erectile dysfunction: Secondary | ICD-10-CM

## 2013-09-24 DIAGNOSIS — F411 Generalized anxiety disorder: Secondary | ICD-10-CM

## 2013-09-24 DIAGNOSIS — N529 Male erectile dysfunction, unspecified: Secondary | ICD-10-CM

## 2013-09-24 DIAGNOSIS — E538 Deficiency of other specified B group vitamins: Secondary | ICD-10-CM

## 2013-09-24 DIAGNOSIS — I10 Essential (primary) hypertension: Secondary | ICD-10-CM

## 2013-09-24 MED ORDER — CYANOCOBALAMIN 1000 MCG/ML IJ SOLN
1000.0000 ug | Freq: Once | INTRAMUSCULAR | Status: AC
  Administered 2013-09-24: 1000 ug via INTRAMUSCULAR

## 2013-09-24 MED ORDER — LISINOPRIL-HYDROCHLOROTHIAZIDE 20-12.5 MG PO TABS: 1.0000 | ORAL_TABLET | Freq: Every day | ORAL | Status: DC

## 2013-09-24 MED ORDER — TADALAFIL 5 MG PO TABS: 5.0000 mg | ORAL_TABLET | Freq: Every day | ORAL | Status: DC | PRN

## 2013-09-24 NOTE — Progress Notes (Signed)
   Subjective:    Patient ID: Joseph Valenzuela, male    DOB: 05/29/1975, 38 y.o.   MRN: [id number]  HPI Medical followup. Patient has history of obesity, chronic back pain, and history of recurrent depression, chronic anxiety, hypertension, erectile dysfunction, chronic elevated liver transaminases (fatty liver). He's been followed by GI. He had labs last visit which were reviewed. Increased stress issues. Still unemployed.  Blood pressure well controlled. Has erectile dysfunction. Has used Viagra and Cialis in past and both have helped. Requesting refills. No chest pains.    Chronic depression and anxiety.  No suicidal ideation.  Has been on multiple prior SSRIs but intolerant of all.  Takes klonopin for anxiety symptoms and has been on this for years.  Past Medical History  Diagnosis Date  . Hypertension   . Suicidal ideations   . Back pain, chronic   . Vitamin B12 deficiency   . Herniated disc   . Asthma   . Alcoholism   . Depression   . Obesity   . Fatty liver   . Esophageal reflux   . Irritable bowel syndrome   . Benign positional vertigo   . Anxiety   . Degenerative spinal arthritis   . Barrett esophagus   . Hiatal hernia   . Spleen enlarged    No past surgical history on file.  reports that he has never smoked. He has never used smokeless tobacco. He reports that he does not drink alcohol or use illicit drugs. family history includes Alcohol abuse in his father; Brain cancer in his maternal grandfather; Depression in his mother; Diabetes in his maternal aunt and mother; Hypertension in his brother; Stomach cancer in his maternal grandmother. There is no history of Colon cancer. Allergies  Allergen Reactions  . Codeine Sulfate     REACTION: nausea,itching      Review of Systems  Constitutional: Positive for fatigue.  Eyes: Negative for visual disturbance.  Respiratory: Negative for cough, chest tightness and shortness of breath.   Cardiovascular: Negative for chest  pain, palpitations and leg swelling.  Gastrointestinal: Negative for abdominal pain.  Endocrine: Negative for polydipsia and polyuria.  Musculoskeletal: Positive for back pain.  Neurological: Negative for dizziness, syncope, weakness, light-headedness and headaches.  Psychiatric/Behavioral: Positive for sleep disturbance. Negative for suicidal ideas. The patient is nervous/anxious.        Objective:   Physical Exam  Constitutional: He appears well-developed and well-nourished.  Neck: Neck supple.  Cardiovascular: Normal rate and regular rhythm.   Pulmonary/Chest: Effort normal and breath sounds normal. No respiratory distress. He has no wheezes. He has no rales.  Musculoskeletal: He exhibits no edema.          Assessment & Plan:  #1 hypertension. Marginal control. Weight loss and reassess 6 months. Refill medication for one year #2 erectile dysfunction. Cialis 5 mg daily as needed #3 chronic anxiety.  Has had extensive counseling in the past.  He will remain on Klonopin.

## 2013-09-24 NOTE — Progress Notes (Signed)
Pre visit review using our clinic review tool, if applicable. No additional management support is needed unless otherwise documented below in the visit note. 

## 2013-09-24 NOTE — Patient Instructions (Signed)
Lose some weight.  Monitor blood pressure and be in touch if consisently < 140/90.

## 2013-12-30 ENCOUNTER — Other Ambulatory Visit: Payer: Self-pay | Admitting: Family Medicine

## 2013-12-30 NOTE — Telephone Encounter (Signed)
Refill for 5 months. 

## 2013-12-30 NOTE — Telephone Encounter (Signed)
Last visit 09/24/13 Last refill 07/19/13 #90 5 refills

## 2014-02-17 ENCOUNTER — Other Ambulatory Visit: Payer: Self-pay | Admitting: Gastroenterology

## 2014-03-27 ENCOUNTER — Ambulatory Visit: Payer: Self-pay | Admitting: Family Medicine

## 2014-05-16 ENCOUNTER — Encounter: Payer: Self-pay | Admitting: Family Medicine

## 2014-05-16 ENCOUNTER — Ambulatory Visit (INDEPENDENT_AMBULATORY_CARE_PROVIDER_SITE_OTHER): Payer: 59 | Admitting: Family Medicine

## 2014-05-16 VITALS — BP 130/84 | HR 94 | Temp 98.3°F | Wt 332.0 lb

## 2014-05-16 DIAGNOSIS — K760 Fatty (change of) liver, not elsewhere classified: Secondary | ICD-10-CM

## 2014-05-16 DIAGNOSIS — E538 Deficiency of other specified B group vitamins: Secondary | ICD-10-CM

## 2014-05-16 DIAGNOSIS — E669 Obesity, unspecified: Secondary | ICD-10-CM

## 2014-05-16 DIAGNOSIS — F5104 Psychophysiologic insomnia: Secondary | ICD-10-CM

## 2014-05-16 DIAGNOSIS — R5383 Other fatigue: Secondary | ICD-10-CM

## 2014-05-16 DIAGNOSIS — F411 Generalized anxiety disorder: Secondary | ICD-10-CM

## 2014-05-16 DIAGNOSIS — I10 Essential (primary) hypertension: Secondary | ICD-10-CM

## 2014-05-16 DIAGNOSIS — R7989 Other specified abnormal findings of blood chemistry: Secondary | ICD-10-CM

## 2014-05-16 DIAGNOSIS — G4700 Insomnia, unspecified: Secondary | ICD-10-CM

## 2014-05-16 DIAGNOSIS — K219 Gastro-esophageal reflux disease without esophagitis: Secondary | ICD-10-CM

## 2014-05-16 LAB — COMPREHENSIVE METABOLIC PANEL
ALT: 51 U/L (ref 0–53)
AST: 92 U/L — ABNORMAL HIGH (ref 0–37)
Albumin: 4.5 g/dL (ref 3.5–5.2)
Alkaline Phosphatase: 112 U/L (ref 39–117)
BILIRUBIN TOTAL: 1.4 mg/dL — AB (ref 0.2–1.2)
BUN: 7 mg/dL (ref 6–23)
CALCIUM: 9.7 mg/dL (ref 8.4–10.5)
CHLORIDE: 93 meq/L — AB (ref 96–112)
CO2: 32 meq/L (ref 19–32)
CREATININE: 0.72 mg/dL (ref 0.40–1.50)
GFR: 130.72 mL/min (ref >60.00)
GLUCOSE: 127 mg/dL — AB (ref 70–99)
Potassium: 3.7 mEq/L (ref 3.5–5.1)
SODIUM: 135 meq/L (ref 135–145)
Total Protein: 7.8 g/dL (ref 6.0–8.3)

## 2014-05-16 LAB — CBC WITH DIFFERENTIAL/PLATELET
BASOS PCT: 0.5 % (ref 0.0–3.0)
Basophils Absolute: 0 10*3/uL (ref 0.0–0.1)
EOS PCT: 1.8 % (ref 0.0–5.0)
Eosinophils Absolute: 0.1 10*3/uL (ref 0.0–0.7)
HEMATOCRIT: 43 % (ref 39.0–52.0)
Hemoglobin: 14.6 g/dL (ref 13.0–17.0)
Lymphocytes Relative: 26.2 % (ref 12.0–46.0)
Lymphs Abs: 2.2 10*3/uL (ref 0.7–4.0)
MCHC: 34 g/dL (ref 30.0–36.0)
MCV: 91.7 fl (ref 78.0–100.0)
Monocytes Absolute: 0.6 10*3/uL (ref 0.1–1.0)
Monocytes Relative: 6.9 % (ref 3.0–12.0)
Neutro Abs: 5.4 10*3/uL (ref 1.4–7.7)
Neutrophils Relative %: 64.6 % (ref 43.0–77.0)
Platelets: 228 10*3/uL (ref 150.0–400.0)
RBC: 4.69 Mil/uL (ref 4.22–5.81)
RDW: 18.8 % — ABNORMAL HIGH (ref 11.5–15.5)
WBC: 8.3 10*3/uL (ref 4.0–10.5)

## 2014-05-16 LAB — TSH: TSH: 4.4 u[IU]/mL (ref 0.35–4.50)

## 2014-05-16 LAB — T4, FREE: Free T4: 0.78 ng/dL (ref 0.60–1.60)

## 2014-05-16 MED ORDER — CLONAZEPAM 1 MG PO TABS: 1.0000 mg | ORAL_TABLET | Freq: Three times a day (TID) | ORAL | Status: DC | PRN

## 2014-05-16 MED ORDER — CYANOCOBALAMIN 1000 MCG/ML IJ SOLN
1000.0000 ug | Freq: Once | INTRAMUSCULAR | Status: AC
  Administered 2014-05-16: 1000 ug via INTRAMUSCULAR

## 2014-05-16 NOTE — Patient Instructions (Signed)
Try OTC Allegra or Claritan or Zyrtec

## 2014-05-16 NOTE — Progress Notes (Signed)
Subjective:    Patient ID: Joseph Valenzuela, male    DOB: 06/07/1975, 39 y.o.   MRN: [id number]  HPI Patient here for medical follow-up.  He has history of obesity, irritable bowel syndrome, hypertension, chronic back pain, GERD, fatty liver changes, depression, B12 deficiency, chronic anxiety, and remote history of alcoholism. He has been for many years on Klonopin and came to me already on that. He's been on this for estimated 15 years. This has done well for him regarding chronic anxiety symptoms. Has been totally abstinent for alcohol for many years now.  He has some chronic fatigue issues. Chronic insomnia. Struggles to sleep for hours at night. Difficulty falling asleep. No alcohol use. Avoids caffeine late in the day. Has some chronic back pain attributes some his sleep difficulties to that. He's tried trazodone the past but did not work.  History of abnormal TSH in the 6 range. He's had chronic fatigue issues. No constipation. No cold intolerance.  He has frequent allergy symptoms with watery history of present illness eyes and nasal congestion. Has not tried any and histamines. He had nasal steroids but did not prefer to use those.  Past Medical History  Diagnosis Date  . Hypertension   . Suicidal ideations   . Back pain, chronic   . Vitamin B12 deficiency   . Herniated disc   . Asthma   . Alcoholism   . Depression   . Obesity   . Fatty liver   . Esophageal reflux   . Irritable bowel syndrome   . Benign positional vertigo   . Anxiety   . Degenerative spinal arthritis   . Barrett esophagus   . Hiatal hernia   . Spleen enlarged    No past surgical history on file.  reports that he has never smoked. He has never used smokeless tobacco. He reports that he does not drink alcohol or use illicit drugs. family history includes Alcohol abuse in his father; Brain cancer in his maternal grandfather; Depression in his mother; Diabetes in his maternal aunt and mother; Hypertension in  his brother; Stomach cancer in his maternal grandmother. There is no history of Colon cancer. Allergies  Allergen Reactions  . Codeine Sulfate     REACTION: nausea,itching      Review of Systems  Constitutional: Positive for fatigue. Negative for fever, chills, appetite change and unexpected weight change.  Respiratory: Negative for shortness of breath.   Cardiovascular: Negative for chest pain.  Gastrointestinal: Negative for abdominal distention.  Genitourinary: Negative for dysuria.  Neurological: Negative for dizziness, seizures and syncope.  Psychiatric/Behavioral: Negative for confusion.       Objective:   Physical Exam  Constitutional: He appears well-developed and well-nourished.  Neck: Neck supple. No thyromegaly present.  Cardiovascular: Normal rate and regular rhythm.   Pulmonary/Chest: Effort normal and breath sounds normal. No respiratory distress. He has no wheezes. He has no rales.  Musculoskeletal: He exhibits no edema.  Lymphadenopathy:    He has no cervical adenopathy.          Assessment & Plan:  #1 obesity. We have discussed recommendation for ongoing weight loss. He is started part-time work has become more active which hopefully will help. #2 history of abnormal TSH. He has increased fatigue as well. Recheck TSH and free T4. #3 seasonal and perennial allergies. Try over-the-counter plain antihistamine such as Allegra or Zyrtec #4 history of fatty liver changes. Recheck liver transaminases. Hopefully this will improve with weight loss efforts #5 chronic  anxiety. Refill clonazepam which he has been on for many years. No history of misuse #6 GERD which is stable on Dexilant

## 2014-05-16 NOTE — Progress Notes (Signed)
Pre visit review using our clinic review tool, if applicable. No additional management support is needed unless otherwise documented below in the visit note. 

## 2014-08-29 ENCOUNTER — Telehealth: Payer: Self-pay | Admitting: Family Medicine

## 2014-08-29 MED ORDER — CLONAZEPAM 1 MG PO TABS: 1.0000 mg | ORAL_TABLET | Freq: Three times a day (TID) | ORAL | Status: DC | PRN

## 2014-08-29 NOTE — Telephone Encounter (Signed)
Pt request refill clonazePAM (KLONOPIN) 1 MG tablet  However, pt know he needs appt but pt need an early am due to one car in a two person family. Can he get 30 day until his appt 7/15?  [facility] [address]

## 2014-08-29 NOTE — Telephone Encounter (Signed)
[facility] at [phone] and I left a message at the pts home number to return my call.

## 2014-08-29 NOTE — Telephone Encounter (Signed)
Ok to get 15 days to get to appointment in PCP absence after review of last OV notes and refill. Rx printed and signed.

## 2014-09-12 ENCOUNTER — Encounter: Payer: Self-pay | Admitting: Family Medicine

## 2014-09-12 ENCOUNTER — Ambulatory Visit (INDEPENDENT_AMBULATORY_CARE_PROVIDER_SITE_OTHER): Payer: 59 | Admitting: Family Medicine

## 2014-09-12 VITALS — BP 116/82 | HR 99 | Temp 98.5°F | Ht 72.0 in | Wt 318.0 lb

## 2014-09-12 DIAGNOSIS — F411 Generalized anxiety disorder: Secondary | ICD-10-CM

## 2014-09-12 DIAGNOSIS — R739 Hyperglycemia, unspecified: Secondary | ICD-10-CM | POA: Diagnosis not present

## 2014-09-12 DIAGNOSIS — E538 Deficiency of other specified B group vitamins: Secondary | ICD-10-CM | POA: Diagnosis not present

## 2014-09-12 DIAGNOSIS — R7401 Elevation of levels of liver transaminase levels: Secondary | ICD-10-CM

## 2014-09-12 DIAGNOSIS — R740 Nonspecific elevation of levels of transaminase and lactic acid dehydrogenase [LDH]: Secondary | ICD-10-CM

## 2014-09-12 LAB — HEPATIC FUNCTION PANEL
ALBUMIN: 4.7 g/dL (ref 3.5–5.2)
ALK PHOS: 118 U/L — AB (ref 39–117)
ALT: 61 U/L — ABNORMAL HIGH (ref 0–53)
AST: 94 U/L — ABNORMAL HIGH (ref 0–37)
Bilirubin, Direct: 0.3 mg/dL (ref 0.0–0.3)
TOTAL PROTEIN: 8.4 g/dL — AB (ref 6.0–8.3)
Total Bilirubin: 1 mg/dL (ref 0.2–1.2)

## 2014-09-12 LAB — BASIC METABOLIC PANEL
BUN: 6 mg/dL (ref 6–23)
CO2: 28 mEq/L (ref 19–32)
CREATININE: 0.9 mg/dL (ref 0.40–1.50)
Calcium: 10.2 mg/dL (ref 8.4–10.5)
Chloride: 96 mEq/L (ref 96–112)
GFR: 100.86 mL/min (ref >60.00)
GLUCOSE: 108 mg/dL — AB (ref 70–99)
Potassium: 4 mEq/L (ref 3.5–5.1)
Sodium: 137 mEq/L (ref 135–145)

## 2014-09-12 LAB — HEMOGLOBIN A1C: HEMOGLOBIN A1C: 5.3 % (ref 4.6–6.5)

## 2014-09-12 MED ORDER — CYANOCOBALAMIN 1000 MCG/ML IJ SOLN
1000.0000 ug | Freq: Once | INTRAMUSCULAR | Status: AC
  Administered 2014-09-12: 1000 ug via INTRAMUSCULAR

## 2014-09-12 MED ORDER — CLONAZEPAM 1 MG PO TABS: 1.0000 mg | ORAL_TABLET | Freq: Three times a day (TID) | ORAL | Status: DC | PRN

## 2014-09-12 NOTE — Progress Notes (Signed)
   Subjective:    Patient ID: Joseph Valenzuela, male    DOB: 09/30/1975, 39 y.o.   MRN: [id number]  HPI Follow-up  Obesity. He is done a tremendous job with weight loss and has lost another 14 pounds last visit. He has done this with dietary change and increased walking. Overall feels better. He has chronic back pain and is hoping this will improve as his weight loss continues  History of elevated liver transaminases probably related to obesity and fatty liver changes. He has remote history of alcoholism. Recent transaminases were improving with weight loss  Hyperglycemia with recent fasting blood sugar 127. With additional weight loss of fluid is improved. No polyuria or polydipsia. No history of A1c. B12 deficiency and requesting injection today  Past Medical History  Diagnosis Date  . Hypertension   . Suicidal ideations   . Back pain, chronic   . Vitamin B12 deficiency   . Herniated disc   . Asthma   . Alcoholism   . Depression   . Obesity   . Fatty liver   . Esophageal reflux   . Irritable bowel syndrome   . Benign positional vertigo   . Anxiety   . Degenerative spinal arthritis   . Barrett esophagus   . Hiatal hernia   . Spleen enlarged    No past surgical history on file.  reports that he has never smoked. He has never used smokeless tobacco. He reports that he does not drink alcohol or use illicit drugs. family history includes Alcohol abuse in his father; Brain cancer in his maternal grandfather; Depression in his mother; Diabetes in his maternal aunt and mother; Hypertension in his brother; Stomach cancer in his maternal grandmother. There is no history of Colon cancer. Allergies  Allergen Reactions  . Codeine Sulfate     REACTION: nausea,itching      Review of Systems  Constitutional: Negative for appetite change, fatigue and unexpected weight change.  Eyes: Negative for visual disturbance.  Respiratory: Negative for cough, chest tightness and shortness of  breath.   Cardiovascular: Negative for chest pain, palpitations and leg swelling.  Endocrine: Negative for polydipsia and polyuria.  Musculoskeletal: Positive for back pain.  Neurological: Negative for dizziness, syncope, weakness, light-headedness and headaches.       Objective:   Physical Exam  Constitutional: He is oriented to person, place, and time. He appears well-developed and well-nourished.  HENT:  Right Ear: External ear normal.  Left Ear: External ear normal.  Mouth/Throat: Oropharynx is clear and moist.  Eyes: Pupils are equal, round, and reactive to light.  Neck: Neck supple. No JVD present. No thyromegaly present.  Cardiovascular: Normal rate and regular rhythm.   Pulmonary/Chest: Effort normal and breath sounds normal. No respiratory distress. He has no wheezes. He has no rales.  Musculoskeletal: He exhibits no edema.  Neurological: He is alert and oriented to person, place, and time.  Psychiatric: He has a normal mood and affect. His behavior is normal.          Assessment & Plan:  #1 hyperglycemia. Repeat fasting blood sugar today along with A1c. Continue weight loss efforts #2 mild elevated liver transaminases probably secondary to fatty liver changes. Continue weight loss efforts. Repeat hepatic panel #3 chronic anxiety. He's been on Klonopin for several years. Refill given for 6 months #4 B12 deficiency. Injection given today. Continue monthly injection

## 2014-09-12 NOTE — Progress Notes (Signed)
Pre visit review using our clinic review tool, if applicable. No additional management support is needed unless otherwise documented below in the visit note. 

## 2014-09-12 NOTE — Addendum Note (Signed)
Addended by: Johnella MoloneyFUNDERBURK, Hollace Michelli A on: 09/12/2014 09:16 AM   Modules accepted: Orders

## 2014-09-13 ENCOUNTER — Other Ambulatory Visit: Payer: Self-pay | Admitting: Family Medicine

## 2014-09-16 ENCOUNTER — Other Ambulatory Visit: Payer: Self-pay | Admitting: *Deleted

## 2014-09-16 DIAGNOSIS — R899 Unspecified abnormal finding in specimens from other organs, systems and tissues: Secondary | ICD-10-CM

## 2014-09-25 ENCOUNTER — Other Ambulatory Visit: Payer: Self-pay

## 2014-10-15 ENCOUNTER — Ambulatory Visit: Payer: Self-pay | Admitting: Nurse Practitioner

## 2014-10-29 ENCOUNTER — Ambulatory Visit: Payer: Self-pay | Admitting: Physician Assistant

## 2014-12-22 ENCOUNTER — Other Ambulatory Visit: Payer: Self-pay | Admitting: *Deleted

## 2014-12-22 ENCOUNTER — Telehealth: Payer: Self-pay | Admitting: *Deleted

## 2014-12-22 MED ORDER — DEXLANSOPRAZOLE 60 MG PO CPDR: 60.0000 mg | DELAYED_RELEASE_CAPSULE | Freq: Every day | ORAL | Status: DC

## 2014-12-22 NOTE — Telephone Encounter (Signed)
Received a fax from [facility] [address] for Dexilant 60 mg.  Willette ClusterPaula Guenther NP prescribed this at the patient's visit 06/17/2013.  Patient will be assigned to Dr. Stan Headarl Gessner who supervised PetersburgPaula on  06-17-2013.  Sent # 30 with no refills under Dr. Leone PayorGessner today 12-22-2014.  Noted on the prescription to pharmacist , the patient will need to keep appointment with PA scheduled for 01-07-2015 before further refills can be given.

## 2015-01-07 ENCOUNTER — Other Ambulatory Visit: Payer: Self-pay | Admitting: *Deleted

## 2015-01-07 ENCOUNTER — Ambulatory Visit (INDEPENDENT_AMBULATORY_CARE_PROVIDER_SITE_OTHER): Payer: 59 | Admitting: Physician Assistant

## 2015-01-07 ENCOUNTER — Encounter: Payer: Self-pay | Admitting: Physician Assistant

## 2015-01-07 ENCOUNTER — Other Ambulatory Visit (INDEPENDENT_AMBULATORY_CARE_PROVIDER_SITE_OTHER): Payer: 59

## 2015-01-07 VITALS — BP 144/82 | HR 88 | Ht 71.0 in | Wt 312.6 lb

## 2015-01-07 DIAGNOSIS — B370 Candidal stomatitis: Secondary | ICD-10-CM

## 2015-01-07 DIAGNOSIS — K760 Fatty (change of) liver, not elsewhere classified: Secondary | ICD-10-CM

## 2015-01-07 DIAGNOSIS — K589 Irritable bowel syndrome without diarrhea: Secondary | ICD-10-CM | POA: Diagnosis not present

## 2015-01-07 DIAGNOSIS — K219 Gastro-esophageal reflux disease without esophagitis: Secondary | ICD-10-CM | POA: Diagnosis not present

## 2015-01-07 DIAGNOSIS — E876 Hypokalemia: Secondary | ICD-10-CM

## 2015-01-07 LAB — HEPATIC FUNCTION PANEL
ALT: 53 U/L (ref 0–53)
AST: 95 U/L — AB (ref 0–37)
Albumin: 4.9 g/dL (ref 3.5–5.2)
Alkaline Phosphatase: 106 U/L (ref 39–117)
BILIRUBIN DIRECT: 0.3 mg/dL (ref 0.0–0.3)
BILIRUBIN TOTAL: 1 mg/dL (ref 0.2–1.2)
TOTAL PROTEIN: 8.4 g/dL — AB (ref 6.0–8.3)

## 2015-01-07 LAB — CBC WITH DIFFERENTIAL/PLATELET
BASOS ABS: 0.1 10*3/uL (ref 0.0–0.1)
Basophils Relative: 0.6 % (ref 0.0–3.0)
Eosinophils Absolute: 0.2 10*3/uL (ref 0.0–0.7)
Eosinophils Relative: 2.1 % (ref 0.0–5.0)
HCT: 45.4 % (ref 39.0–52.0)
HEMOGLOBIN: 14.9 g/dL (ref 13.0–17.0)
LYMPHS ABS: 3.2 10*3/uL (ref 0.7–4.0)
Lymphocytes Relative: 32.9 % (ref 12.0–46.0)
MCHC: 32.8 g/dL (ref 30.0–36.0)
MCV: 96.1 fl (ref 78.0–100.0)
MONOS PCT: 6.4 % (ref 3.0–12.0)
Monocytes Absolute: 0.6 10*3/uL (ref 0.1–1.0)
NEUTROS PCT: 58 % (ref 43.0–77.0)
Neutro Abs: 5.6 10*3/uL (ref 1.4–7.7)
Platelets: 304 10*3/uL (ref 150.0–400.0)
RBC: 4.73 Mil/uL (ref 4.22–5.81)
RDW: 18.8 % — ABNORMAL HIGH (ref 11.5–15.5)
WBC: 9.7 10*3/uL (ref 4.0–10.5)

## 2015-01-07 LAB — BASIC METABOLIC PANEL
BUN: 3 mg/dL — ABNORMAL LOW (ref 6–23)
CHLORIDE: 97 meq/L (ref 96–112)
CO2: 25 meq/L (ref 19–32)
Calcium: 10.2 mg/dL (ref 8.4–10.5)
Creatinine, Ser: 0.62 mg/dL (ref 0.40–1.50)
GFR: 154.79 mL/min (ref >60.00)
GLUCOSE: 105 mg/dL — AB (ref 70–99)
Potassium: 3 mEq/L — ABNORMAL LOW (ref 3.5–5.1)
SODIUM: 138 meq/L (ref 135–145)

## 2015-01-07 LAB — LIPASE: Lipase: 21 U/L (ref 11.0–59.0)

## 2015-01-07 MED ORDER — FLUCONAZOLE 100 MG PO TABS: 100.0000 mg | ORAL_TABLET | Freq: Every day | ORAL | Status: DC

## 2015-01-07 MED ORDER — DEXLANSOPRAZOLE 60 MG PO CPDR: 60.0000 mg | DELAYED_RELEASE_CAPSULE | Freq: Every day | ORAL | Status: DC

## 2015-01-07 MED ORDER — POTASSIUM CHLORIDE ER 10 MEQ PO CPCR: ORAL_CAPSULE | ORAL | Status: DC

## 2015-01-07 NOTE — Patient Instructions (Signed)
Your physician has requested that you go to the basement for lab work before leaving today.  We have sent the following medications to your pharmacy for you to pick up at your convenience: Dexilant and Diflucan.   Please see your PCP about managing your blood pressure.

## 2015-01-08 ENCOUNTER — Encounter: Payer: Self-pay | Admitting: Physician Assistant

## 2015-01-08 NOTE — Progress Notes (Signed)
Patient ID: Joseph CarBobby D Grudzien, male   DOB: 10/25/1975, 39 y.o.   MRN: [id number]     History of Present Illness: Joseph Valenzuela  Is a pleasant 39 year old male who has formally been followed by Dr. Jarold MottoPatterson. He has a history of GERD with Barrett's esophagus in 2009, fatty liver, IBS, obesity, anxiety and depression, and alcoholism. He was last seen in April 2015. He had a CT at that time that showed mild hepatomegaly with mild hepatic fatty infiltration and no focal hepatic mass. Splenomegaly was noted. No hydronephrosis or hydroureter normal appendix. Small umbilical hernia containing fat without evidence of acute complication. No small bowel or colonic obstruction. He is here today for follow-up. He has been losing weight and feels his GERD has been under better control than it has been in the past. His bowel movements have regulated since he is eating a more healthy diet. He occasionally will have spurts where he drinks 2 or 3 drinks at night , and during these periods his stools will be loose. He has had no bright red blood per rectum or melena. He was recently seen at a walk-in clinic for thrush. He states he was given several days of Diflucan and nystatin swish and swallow. He states his mouth is better but is still not normal.  last EGD was 04/18/2007 and biopsies were consistent for Barrett's esophagus/intestinal metaplasia. No evidence of dysplasia or malignancy.  Past Medical History  Diagnosis Date  . Hypertension   . Suicidal ideations   . Back pain, chronic   . Vitamin B12 deficiency   . Herniated disc   . Asthma   . Alcoholism (HCC)   . Depression   . Obesity   . Fatty liver   . Esophageal reflux   . Irritable bowel syndrome   . Benign positional vertigo   . Anxiety   . Degenerative spinal arthritis   . Barrett esophagus   . Hiatal hernia   . Spleen enlarged   . Candidiasis of mouth     History reviewed. No pertinent past surgical history. Family History  Problem  Relation Age of Onset  . Alcohol abuse Father   . Depression Mother   . Hypertension Brother   . Diabetes Maternal Aunt   . Diabetes Mother   . Stomach cancer Maternal Grandmother     great  . Brain cancer Maternal Grandfather   . Colon cancer Neg Hx   . Esophageal cancer Neg Hx    Social History  Substance Use Topics  . Smoking status: Never Smoker   . Smokeless tobacco: Never Used  . Alcohol Use: 0.0 oz/week    0 Standard drinks or equivalent per week     Comment: "5th a week" as of 01/07/15   Current Outpatient Prescriptions  Medication Sig Dispense Refill  . clidinium-chlordiazePOXIDE (LIBRAX) 2.5-5 MG per capsule Take 1 capsule by mouth 3 (three) times daily as needed. 90 capsule 5  . clonazePAM (KLONOPIN) 1 MG tablet Take 1 tablet (1 mg total) by mouth 3 (three) times daily as needed for anxiety. 90 tablet 5  . dexlansoprazole (DEXILANT) 60 MG capsule Take 1 capsule (60 mg total) by mouth daily. 90 capsule 3  . lisinopril-hydrochlorothiazide (PRINZIDE,ZESTORETIC) 20-12.5 MG per tablet Take 1 tablet by mouth daily. 90 tablet 3  . nystatin (MYCOSTATIN) 100000 UNIT/ML suspension SWISH AND SWALLOW 4MLS 4 TIMES DAILY UNTIL AT LEAST 48HOURS AFTER SYMPTOMS RESOLVE  0  . fluconazole (DIFLUCAN) 100 MG tablet Take 1 tablet (100  mg total) by mouth daily. 6 tablet 0  . potassium chloride (MICRO-K) 10 MEQ CR capsule Take 2 tablets BID x 2 days 8 capsule 0   No current facility-administered medications for this visit.   Allergies  Allergen Reactions  . Codeine Sulfate     REACTION: nausea,itching     Review of Systems: Gen: Denies any fever, chills, sweats, anorexia, fatigue, weakness, malaise, weight loss, and sleep disorder CV: Denies chest pain, angina, palpitations, syncope, orthopnea, PND, peripheral edema, and claudication. Resp: Denies dyspnea at rest, dyspnea with exercise, cough, sputum, wheezing, coughing up blood, and pleurisy. GI: Denies vomiting blood, jaundice, and  fecal incontinence.   Denies dysphagia or odynophagia. GU : Denies urinary burning, blood in urine, urinary frequency, urinary hesitancy, nocturnal urination, and urinary incontinence. MS: Denies joint pain, limitation of movement, and swelling, stiffness, low back pain, extremity pain. Denies muscle weakness, cramps, atrophy.  Derm: Denies rash, itching, dry skin, hives, moles, warts, or unhealing ulcers.  Psych: Denies depression, anxiety, memory loss, suicidal ideation, hallucinations, paranoia, and confusion. Heme: Denies bruising, bleeding, and enlarged lymph nodes. Neuro:  Denies any headaches, dizziness, paresthesia Endo:  Denies any problems with DM, thyroid, adrenal     Physical Exam: BP 144/82 mmHg  Pulse 88  Ht  (1.803 m)  Wt 312 lb 9.6 oz (141.794 kg)  BMI 43.62 kg/m2 General: Pleasant,  Obese, Caucasianmale in no acute distress Head: Normocephalic and atraumatic , oropharynx with a white coating on lateral edges of tongue and on pupil mucosal and roof mouth. Eyes:  sclerae anicteric, conjunctiva pink  Ears: Normal auditory acuity Lungs: Clear throughout to auscultation Heart: Regular rate and rhythm Abdomen: Soft, non distended, non-tender. No masses, no hepatomegaly. Normal bowel sounds Musculoskeletal: Symmetrical with no gross deformities  Extremities: No edema  Neurological: Alert oriented x 4, grossly nonfocal Psychological:  Alert and cooperative. Normal mood and affect  Assessment and Recommendations:   #1 thrush. He will be given a trial of Diflucan 100 mg, 2 tabs today, then 1 tab daily for 4 days. He will continue his nystatin swish and swallow that he has at home until complete.    #2. GERD and Barrett's esophagus. His GERD has been under good control. He will continue dexilant 1 tab by mouth every morning. Will review timing for surveillance for Barrett's with attending. Patient currently wishes to minimize cost.    #3. Chronic IBS. His stools have  been more regular since he is eating less junk food and eating more healthy foods.     #4. Fatty liver. He will continue to try to lose weight. Will check a hepatic panel ,  Metabolic panel and CBC today.       Ellery Tash, Tollie Pizza PA-C 01/08/2015,

## 2015-01-09 ENCOUNTER — Other Ambulatory Visit: Payer: Self-pay | Admitting: *Deleted

## 2015-01-09 ENCOUNTER — Other Ambulatory Visit (INDEPENDENT_AMBULATORY_CARE_PROVIDER_SITE_OTHER): Payer: 59

## 2015-01-09 ENCOUNTER — Telehealth: Payer: Self-pay | Admitting: *Deleted

## 2015-01-09 DIAGNOSIS — E876 Hypokalemia: Secondary | ICD-10-CM | POA: Diagnosis not present

## 2015-01-09 LAB — POTASSIUM: Potassium: 3.4 mEq/L — ABNORMAL LOW (ref 3.5–5.1)

## 2015-01-09 MED ORDER — POTASSIUM CHLORIDE CRYS ER 20 MEQ PO TBCR: EXTENDED_RELEASE_TABLET | ORAL | Status: DC

## 2015-01-09 NOTE — Telephone Encounter (Signed)
Patient notified of recommendation. 

## 2015-01-09 NOTE — Telephone Encounter (Signed)
Patient states he has taken Diflucan and Nystatin as ordered. States the Candidiasis of mouth is still painful. Please, advise.

## 2015-01-09 NOTE — Telephone Encounter (Signed)
Follow up with pcp

## 2015-01-24 NOTE — Progress Notes (Signed)
Barrett's esophagus diagnosed in 2009 - it could be irregular Z-line and not true Barrett's He should have another EGD - its up to him but I recommend that - if he will not schedule an EGD then have him see me ion 2017 Jan or Feb

## 2015-01-26 ENCOUNTER — Telehealth: Payer: Self-pay | Admitting: *Deleted

## 2015-01-26 NOTE — Telephone Encounter (Signed)
Received fax from [facility] regarding Dexilant 60 mg prescription for this patient.  Lori Hvozdovic PA prescribed this medication for this patient. Filled out form with information regarding a diagnosis of Barretts Esophagus per Lawson FiscalLori Hvozdovic PA.  Faxed back to [facility].

## 2015-03-06 ENCOUNTER — Ambulatory Visit (INDEPENDENT_AMBULATORY_CARE_PROVIDER_SITE_OTHER): Payer: 59 | Admitting: Family Medicine

## 2015-03-06 ENCOUNTER — Encounter: Payer: Self-pay | Admitting: Family Medicine

## 2015-03-06 DIAGNOSIS — E538 Deficiency of other specified B group vitamins: Secondary | ICD-10-CM

## 2015-03-06 DIAGNOSIS — J209 Acute bronchitis, unspecified: Secondary | ICD-10-CM

## 2015-03-06 MED ORDER — BENZONATATE 200 MG PO CAPS: 200.0000 mg | ORAL_CAPSULE | Freq: Three times a day (TID) | ORAL | Status: DC | PRN

## 2015-03-06 MED ORDER — CYANOCOBALAMIN 1000 MCG/ML IJ SOLN
1000.0000 ug | Freq: Once | INTRAMUSCULAR | Status: AC
  Administered 2015-03-06: 1000 ug via INTRAMUSCULAR

## 2015-03-06 MED ORDER — CLONAZEPAM 1 MG PO TABS: 1.0000 mg | ORAL_TABLET | Freq: Three times a day (TID) | ORAL | Status: DC | PRN

## 2015-03-06 NOTE — Patient Instructions (Signed)

## 2015-03-06 NOTE — Addendum Note (Signed)
Addended by: Tempie HoistMCNEIL, AUTUMN M on: 03/06/2015 11:12 AM   Modules accepted: Orders

## 2015-03-06 NOTE — Progress Notes (Signed)
Pre visit review using our clinic review tool, if applicable. No additional management support is needed unless otherwise documented below in the visit note. 

## 2015-03-06 NOTE — Progress Notes (Signed)
   Subjective:    Patient ID: Joseph Valenzuela, male    DOB: 06/01/1975, 40 y.o.   MRN: [id number]  HPI  Acute visit.  Patient seen with cough and mild sore throat which started a few days ago. He's also had some nasal congestion. Increase fatigue. Mild body aches. No definite fever. Mild chills. Denies any nausea, vomiting, or diarrhea. Productive cough is worse first thing in the morning.  Patient's chronic anxiety and requesting refills of Klonopin. He has B12 deficiency and requesting B12 injection. He's not had lab work for B12 levels in quite some time. He has follow-up with GI soon.  Past Medical History  Diagnosis Date  . Hypertension   . Suicidal ideations   . Back pain, chronic   . Vitamin B12 deficiency   . Herniated disc   . Asthma   . Alcoholism (HCC)   . Depression   . Obesity   . Fatty liver   . Esophageal reflux   . Irritable bowel syndrome   . Benign positional vertigo   . Anxiety   . Degenerative spinal arthritis   . Barrett esophagus   . Hiatal hernia   . Spleen enlarged   . Candidiasis of mouth    No past surgical history on file.  reports that he has never smoked. He has never used smokeless tobacco. He reports that he drinks alcohol. He reports that he does not use illicit drugs. family history includes Alcohol abuse in his father; Brain cancer in his maternal grandfather; Depression in his mother; Diabetes in his maternal aunt and mother; Hypertension in his brother; Stomach cancer in his maternal grandmother. There is no history of Colon cancer or Esophageal cancer. Allergies  Allergen Reactions  . Codeine Sulfate     REACTION: nausea,itching     Review of Systems  Constitutional: Positive for chills. Negative for fever.  HENT: Positive for congestion and sore throat.   Respiratory: Positive for cough.   Cardiovascular: Negative for chest pain.       Objective:   Physical Exam  Constitutional: He appears well-developed and well-nourished.    HENT:  Right Ear: External ear normal.  Left Ear: External ear normal.  Mouth/Throat: Oropharynx is clear and moist.  Neck: Neck supple. No thyromegaly present.  Cardiovascular: Normal rate and regular rhythm.   Pulmonary/Chest: Effort normal and breath sounds normal. No respiratory distress. He has no wheezes. He has no rales.  Lymphadenopathy:    He has no cervical adenopathy.          Assessment & Plan:  Acute bronchitis. Suspect viral. No indication for antibiotics. Tessalon Perles 200 mg every 8 hours as needed for cough. He has prior history of allergy to codeine. Follow-up for increased fever or other worsening symptoms Refills of Klonopin were given for 6 months

## 2015-03-22 ENCOUNTER — Other Ambulatory Visit: Payer: Self-pay | Admitting: Family Medicine

## 2015-04-07 ENCOUNTER — Ambulatory Visit: Payer: Self-pay | Admitting: Internal Medicine

## 2015-06-03 ENCOUNTER — Ambulatory Visit (INDEPENDENT_AMBULATORY_CARE_PROVIDER_SITE_OTHER): Payer: 59 | Admitting: Internal Medicine

## 2015-06-03 ENCOUNTER — Encounter: Payer: Self-pay | Admitting: Internal Medicine

## 2015-06-03 ENCOUNTER — Other Ambulatory Visit (INDEPENDENT_AMBULATORY_CARE_PROVIDER_SITE_OTHER): Payer: 59

## 2015-06-03 VITALS — BP 132/88 | HR 71 | Ht 71.0 in | Wt 336.4 lb

## 2015-06-03 DIAGNOSIS — K2270 Barrett's esophagus without dysplasia: Secondary | ICD-10-CM

## 2015-06-03 DIAGNOSIS — R110 Nausea: Secondary | ICD-10-CM

## 2015-06-03 DIAGNOSIS — K760 Fatty (change of) liver, not elsewhere classified: Secondary | ICD-10-CM

## 2015-06-03 DIAGNOSIS — R160 Hepatomegaly, not elsewhere classified: Secondary | ICD-10-CM

## 2015-06-03 DIAGNOSIS — K920 Hematemesis: Secondary | ICD-10-CM

## 2015-06-03 DIAGNOSIS — K589 Irritable bowel syndrome without diarrhea: Secondary | ICD-10-CM

## 2015-06-03 DIAGNOSIS — F1020 Alcohol dependence, uncomplicated: Secondary | ICD-10-CM

## 2015-06-03 LAB — CBC WITH DIFFERENTIAL/PLATELET
BASOS PCT: 0.5 % (ref 0.0–3.0)
Basophils Absolute: 0 10*3/uL (ref 0.0–0.1)
EOS ABS: 0.1 10*3/uL (ref 0.0–0.7)
Eosinophils Relative: 1.1 % (ref 0.0–5.0)
HCT: 40.8 % (ref 39.0–52.0)
Hemoglobin: 13.6 g/dL (ref 13.0–17.0)
Lymphocytes Relative: 27.4 % (ref 12.0–46.0)
Lymphs Abs: 2.8 10*3/uL (ref 0.7–4.0)
MCHC: 33.4 g/dL (ref 30.0–36.0)
MCV: 91.8 fl (ref 78.0–100.0)
MONO ABS: 0.5 10*3/uL (ref 0.1–1.0)
Monocytes Relative: 5.1 % (ref 3.0–12.0)
NEUTROS ABS: 6.6 10*3/uL (ref 1.4–7.7)
Neutrophils Relative %: 65.9 % (ref 43.0–77.0)
PLATELETS: 297 10*3/uL (ref 150.0–400.0)
RBC: 4.44 Mil/uL (ref 4.22–5.81)
RDW: 20.2 % — AB (ref 11.5–15.5)
WBC: 10.1 10*3/uL (ref 4.0–10.5)

## 2015-06-03 LAB — COMPREHENSIVE METABOLIC PANEL
ALT: 20 U/L (ref 0–53)
AST: 56 U/L — AB (ref 0–37)
Albumin: 4.4 g/dL (ref 3.5–5.2)
Alkaline Phosphatase: 114 U/L (ref 39–117)
BUN: 4 mg/dL — AB (ref 6–23)
CHLORIDE: 95 meq/L — AB (ref 96–112)
CO2: 34 meq/L — AB (ref 19–32)
CREATININE: 0.63 mg/dL (ref 0.40–1.50)
Calcium: 10.1 mg/dL (ref 8.4–10.5)
GFR: 151.63 mL/min (ref >60.00)
Glucose, Bld: 118 mg/dL — ABNORMAL HIGH (ref 70–99)
Potassium: 3.3 mEq/L — ABNORMAL LOW (ref 3.5–5.1)
SODIUM: 137 meq/L (ref 135–145)
Total Bilirubin: 1.3 mg/dL — ABNORMAL HIGH (ref 0.2–1.2)
Total Protein: 8.2 g/dL (ref 6.0–8.3)

## 2015-06-03 LAB — PROTIME-INR
INR: 1.3 ratio — AB (ref 0.8–1.0)
PROTHROMBIN TIME: 13.5 s — AB (ref 9.6–13.1)

## 2015-06-03 MED ORDER — ESOMEPRAZOLE MAGNESIUM 40 MG PO CPDR: 40.0000 mg | DELAYED_RELEASE_CAPSULE | Freq: Every day | ORAL | Status: DC

## 2015-06-03 MED ORDER — CILIDINIUM-CHLORDIAZEPOXIDE 2.5-5 MG PO CAPS: 1.0000 | ORAL_CAPSULE | Freq: Three times a day (TID) | ORAL | Status: DC | PRN
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
## 2015-06-03 NOTE — Patient Instructions (Signed)
  Your physician has requested that you go to the basement for the following lab work before leaving today: CBC/diff, CMET, PT/INR   We have sent the following medications to your pharmacy for you to pick up at your convenience: Librax   You have been scheduled for an endoscopy. Please follow written instructions given to you at your visit today. If you use inhalers (even only as needed), please bring them with you on the day of your procedure. Your physician has requested that you go to www.startemmi.com and enter the access code given to you at your visit today. This web site gives a general overview about your procedure. However, you should still follow specific instructions given to you by our office regarding your preparation for the procedure.    I appreciate the opportunity to care for you.

## 2015-06-04 NOTE — Progress Notes (Signed)
Quick Note:  Mild lab abnormalities - will explain more at egd  He has a quantiferon pending need to cancel this - believe a mistake ______

## 2015-06-08 ENCOUNTER — Encounter: Payer: Self-pay | Admitting: Internal Medicine

## 2015-06-08 ENCOUNTER — Ambulatory Visit (AMBULATORY_SURGERY_CENTER): Payer: 59 | Admitting: Internal Medicine

## 2015-06-08 ENCOUNTER — Telehealth: Payer: Self-pay

## 2015-06-08 VITALS — BP 100/62 | HR 87 | Temp 98.9°F | Resp 18 | Ht 71.0 in | Wt 336.0 lb

## 2015-06-08 DIAGNOSIS — K2970 Gastritis, unspecified, without bleeding: Secondary | ICD-10-CM

## 2015-06-08 DIAGNOSIS — R110 Nausea: Secondary | ICD-10-CM | POA: Diagnosis not present

## 2015-06-08 DIAGNOSIS — K920 Hematemesis: Secondary | ICD-10-CM

## 2015-06-08 DIAGNOSIS — R161 Splenomegaly, not elsewhere classified: Secondary | ICD-10-CM

## 2015-06-08 DIAGNOSIS — R160 Hepatomegaly, not elsewhere classified: Secondary | ICD-10-CM

## 2015-06-08 DIAGNOSIS — K3189 Other diseases of stomach and duodenum: Secondary | ICD-10-CM | POA: Diagnosis not present

## 2015-06-08 DIAGNOSIS — Z8719 Personal history of other diseases of the digestive system: Secondary | ICD-10-CM | POA: Diagnosis not present

## 2015-06-08 DIAGNOSIS — K208 Other esophagitis: Secondary | ICD-10-CM | POA: Diagnosis not present

## 2015-06-08 MED ORDER — SODIUM CHLORIDE 0.9 % IV SOLN: 500.0000 mL | INTRAVENOUS | Status: DC

## 2015-06-08 NOTE — Patient Instructions (Addendum)
There was gastritis - irritation in stomach - I took biopsies.  You had some changes of Barrett's esophagus based upon old criteria - may not really need this biopsied again though I took biopsies today.  Keep trying to reduce and eliminate alcohol.  My office will arrange for an ultrasound of the abdomen to look at the liver and spleen again.  I appreciate the opportunity to care for you. Iva Booparl E. Micajah Dennin, MD, FACG   YOU HAD AN ENDOSCOPIC PROCEDURE TODAY AT THE [facility]:   Refer to the procedure report that was given to you for any specific questions about what was found during the examination.  If the procedure report does not answer your questions, please call your gastroenterologist to clarify.  If you requested that your care partner not be given the details of your procedure findings, then the procedure report has been included in a sealed envelope for you to review at your convenience later.  YOU SHOULD EXPECT: Some feelings of bloating in the abdomen. Passage of more gas than usual.  Walking can help get rid of the air that was put into your GI tract during the procedure and reduce the bloating. If you had a lower endoscopy (such as a colonoscopy or flexible sigmoidoscopy) you may notice spotting of blood in your stool or on the toilet paper. If you underwent a bowel prep for your procedure, you may not have a normal bowel movement for a few days.  Please Note:  You might notice some irritation and congestion in your nose or some drainage.  This is from the oxygen used during your procedure.  There is no need for concern and it should clear up in a day or so.  SYMPTOMS TO REPORT IMMEDIATELY:   Following upper endoscopy (EGD)  Vomiting of blood or coffee ground material  New chest pain or pain under the shoulder blades  Painful or persistently difficult swallowing  New shortness of breath  Fever of 100F or higher  Black, tarry-looking stools  For urgent or  emergent issues, a gastroenterologist can be reached at any hour by calling [phone].   DIET: Your first meal following the procedure should be a small meal and then it is ok to progress to your normal diet. Heavy or fried foods are harder to digest and may make you feel nauseous or bloated.  Likewise, meals heavy in dairy and vegetables can increase bloating.  Drink plenty of fluids but you should avoid alcoholic beverages for 24 hours.  ACTIVITY:  You should plan to take it easy for the rest of today and you should NOT DRIVE or use heavy machinery until tomorrow (because of the sedation medicines used during the test).    FOLLOW UP: Our staff will call the number listed on your records the next business day following your procedure to check on you and address any questions or concerns that you may have regarding the information given to you following your procedure. If we do not reach you, we will leave a message.  However, if you are feeling well and you are not experiencing any problems, there is no need to return our call.  We will assume that you have returned to your regular daily activities without incident.  If any biopsies were taken you will be contacted by phone or by letter within the next 1-3 weeks.  Please call us at [phone] if you have not heard about the biopsies in 3 weeks.  SIGNATURES/CONFIDENTIALITY: You and/or your care partner have signed paperwork which will be entered into your electronic medical record.  These signatures attest to the fact that that the information above on your After Visit Summary has been reviewed and is understood.  Full responsibility of the confidentiality of this discharge information lies with you and/or your care-partner.  3rd floor staff will call you to schedule an ultrasound regarding your liver and spleen.  Thank-you for choosing [place] for your healthcare needs today.  Try to cut back on your alcohol intake.

## 2015-06-08 NOTE — Telephone Encounter (Signed)
-----   Message from Iva Booparl E Gessner, MD sent at 06/08/2015  8:04 AM EDT ----- Regarding: US  I forgot dx for US - hepatomegaly and splenomegaly  You will see this requested at today's EGD

## 2015-06-08 NOTE — Progress Notes (Signed)
Patient denies any allergies to eggs or soy. 

## 2015-06-08 NOTE — Progress Notes (Signed)
Report to PACU, RN, vss, BBS= Clear.  

## 2015-06-08 NOTE — Telephone Encounter (Signed)
Patient notified

## 2015-06-08 NOTE — Progress Notes (Signed)
Called to room to assist during endoscopic procedure.  Patient ID and intended procedure confirmed with present staff. Received instructions for my participation in the procedure from the performing physician.  

## 2015-06-08 NOTE — Telephone Encounter (Signed)
Patient [facility] for [facility]15 [facility]:30 am [facility].  He is to be NPO after midnight.

## 2015-06-08 NOTE — Op Note (Signed)
[facility] Patient Name: Joseph Valenzuela Procedure Date: 06/08/2015 7:29 AM MRN: [id number] Endoscopist: Iva Boop , MD Age: 40 Date of Birth: [birth year] Gender: Male Procedure:                Upper GI endoscopy Indications:              Hematemesis, Follow-up of Barrett's esophagus Medicines:                Propofol per Anesthesia, Monitored Anesthesia Care Procedure:                Pre-Anesthesia Assessment:                           - Prior to the procedure, a History and Physical                            was performed, and patient medications and                            allergies were reviewed. The patient's tolerance of                            previous anesthesia was also reviewed. The risks                            and benefits of the procedure and the sedation                            options and risks were discussed with the patient.                            All questions were answered, and informed consent                            was obtained. Prior Anticoagulants: The patient has                            taken no previous anticoagulant or antiplatelet                            agents. ASA Grade Assessment: III - A patient with                            severe systemic disease. After reviewing the risks                            and benefits, the patient was deemed in                            satisfactory condition to undergo the procedure.                           After obtaining informed consent, the endoscope was  passed under direct vision. Throughout the                            procedure, the patient's blood pressure, pulse, and                            oxygen saturations were monitored continuously. The                            Model GIF-HQ190 [id number]) scope was introduced                            through the mouth, and advanced to the second part                            of duodenum. The  upper GI endoscopy was                            accomplished without difficulty. The patient                            tolerated the procedure well. Scope In: Scope Out: Findings:                 The esophagus and gastroesophageal junction were                            examined with white light from a forward view and                            retroflexed position. [Classification]. [Upper end                            G folds (Bot)] [Z-line (Top)]. Salmon-colored                            mucosa was present. Irregular z-line only. Biopsies                            were taken with a cold forceps for histology.                            Verification of patient identification for the                            specimen was done. Estimated blood loss was minimal.                           Three columns of non-bleeding transiently seen                            prominent veins ? varices noticed after Barrett's                            biopsied were  found in the lower third of the                            esophagus,. They were 1 mm in largest diameter. No                            stigmata of recent bleeding were evident and no red                            wale signs were present.                           Patchy mild inflammation characterized by erosions,                            erythema, friability and granularity was found in                            the gastric antrum. Biopsies were taken with a cold                            forceps for histology. Verification of patient                            identification for the specimen was done. Estimated                            blood loss was minimal.                           The exam was otherwise without abnormality.                           The cardia and gastric fundus were normal on                            retroflexion. Complications:            No immediate complications. Estimated Blood Loss:      Estimated blood loss was minimal. Impression:               - Esophageal mucosal changes secondary to                            established Barrett's disease. Biopsied. Irregular                            z-line only - does not meet current crirteria of                            Barrett's by endoscopic exam.                           - Non-bleeding transiently seen prominent veins ?  varices noticed after Barrett's biopsied.                           - Gastritis. Biopsied. This could be cause of                            slight hematemesis - EtOH induced?                           - The examination was otherwise normal. Recommendation:           - Patient has a contact number available for                            emergencies. The signs and symptoms of potential                            delayed complications were discussed with the                            patient. Return to normal activities tomorrow.                            Written discharge instructions were provided to the                            patient.                           - Resume previous diet.                           - Continue present medications.                           - Await pathology results.                           - Repeat upper endoscopy in 1 year {skip}depending                            upon status of liver disease - unlikely to repeat                            re: Barrett's but he may have very early varices? -                           - Perform an abdominal ultrasound at appointment to                            be scheduled.                           - Keep reducing alcohol Iva Boop, MD 06/08/2015 8:03:41 AM This report has been signed electronically. CC Letter to:  Kristian Covey

## 2015-06-09 ENCOUNTER — Telehealth: Payer: Self-pay

## 2015-06-09 NOTE — Telephone Encounter (Signed)
Left a message at [phone] for the pt to call us back if any questions or concerns. maw

## 2015-06-15 ENCOUNTER — Ambulatory Visit (HOSPITAL_COMMUNITY): Admission: RE | Admit: 2015-06-15 | Payer: 59 | Source: Ambulatory Visit

## 2015-06-15 NOTE — Progress Notes (Signed)
Quick Note:  EGD bxs ok - no Barrett's and no infection in gastritis  He was supposed to have US today - does not look like he went?  Please contact him about both  LEC no letter Needs 1 yr EGD recall ______

## 2015-08-31 ENCOUNTER — Telehealth: Payer: Self-pay | Admitting: Family Medicine

## 2015-08-31 ENCOUNTER — Other Ambulatory Visit: Payer: Self-pay | Admitting: Family Medicine

## 2015-08-31 MED ORDER — CLONAZEPAM 1 MG PO TABS: 1.0000 mg | ORAL_TABLET | Freq: Three times a day (TID) | ORAL | Status: DC | PRN

## 2015-08-31 MED ORDER — LISINOPRIL-HYDROCHLOROTHIAZIDE 20-12.5 MG PO TABS: 1.0000 | ORAL_TABLET | Freq: Every day | ORAL | Status: DC

## 2015-08-31 NOTE — Telephone Encounter (Signed)
Lisinopril HCTZ sent in and Lorazepam phoned in. Informed patient.

## 2015-08-31 NOTE — Telephone Encounter (Signed)
Refill for 6 months. 

## 2015-08-31 NOTE — Telephone Encounter (Signed)
Refill both medications for 6 months 

## 2015-08-31 NOTE — Telephone Encounter (Signed)
Last seen on 09-12-14

## 2015-08-31 NOTE — Telephone Encounter (Signed)
Pt is out of lisinopril-hctz 20-12.5 mg #90 and clonazepam #90 w/refills send to [facility]

## 2016-02-09 ENCOUNTER — Encounter: Payer: Self-pay | Admitting: Family Medicine

## 2016-02-09 ENCOUNTER — Ambulatory Visit (INDEPENDENT_AMBULATORY_CARE_PROVIDER_SITE_OTHER): Payer: Managed Care, Other (non HMO) | Admitting: Family Medicine

## 2016-02-09 VITALS — BP 100/70 | HR 91 | Temp 98.2°F | Ht 71.0 in | Wt 347.0 lb

## 2016-02-09 DIAGNOSIS — F411 Generalized anxiety disorder: Secondary | ICD-10-CM

## 2016-02-09 DIAGNOSIS — I10 Essential (primary) hypertension: Secondary | ICD-10-CM

## 2016-02-09 DIAGNOSIS — R899 Unspecified abnormal finding in specimens from other organs, systems and tissues: Secondary | ICD-10-CM | POA: Diagnosis not present

## 2016-02-09 DIAGNOSIS — E876 Hypokalemia: Secondary | ICD-10-CM

## 2016-02-09 MED ORDER — CLONAZEPAM 1 MG PO TABS: 1.0000 mg | ORAL_TABLET | Freq: Three times a day (TID) | ORAL | 5 refills | Status: DC | PRN

## 2016-02-09 NOTE — Progress Notes (Signed)
Subjective:     Patient ID: Joseph Valenzuela, male   DOB: 01/14/1976, 40 y.o.   MRN: [id number]  HPI Patient seen for follow-up regarding multiple problems.  Hypertension treated with lisinopril HCTZ. He is compliant with therapy. Does not monitor blood pressure very often. No dizziness. No headaches. He's had history of hypokalemia in the past. Last potassium was 3.3. He does not take any supplement.  He has chronic low back pain which he has had for several years. He has unfortunately gained about 15 pounds since he was here last visit. He just recently got coverage again with insurance.  Long history of chronic anxiety. He has history of panic disorder. He's been on multiple serotonin reuptake inhibitors but had either side effects or no improvement with symptoms. He's been on regimen of Klonopin 1 mg 3 times a day for several years. He has long-standing history of GERD and is on Nexium. ED last spring which apparently was unrevealing  Past Medical History:  Diagnosis Date  . Alcoholism (HCC)   . Anxiety   . Asthma   . Back pain, chronic   . Barrett esophagus   . Benign positional vertigo   . Candidiasis of mouth   . Degenerative spinal arthritis   . Depression   . Esophageal reflux   . Fatty liver   . Herniated disc   . Hiatal hernia   . Hypertension   . Irritable bowel syndrome   . Obesity   . Spleen enlarged   . Suicidal ideations   . Vitamin B12 deficiency    Past Surgical History:  Procedure Laterality Date  . TYMPANOSTOMY TUBE PLACEMENT    . UPPER GASTROINTESTINAL ENDOSCOPY      reports that he has never smoked. He has never used smokeless tobacco. He reports that he drinks alcohol. He reports that he does not use drugs. family history includes Alcohol abuse in his father; Brain cancer in his maternal grandfather; Depression in his mother; Diabetes in his maternal aunt and mother; Hypertension in his brother; Stomach cancer in his maternal grandmother. Allergies   Allergen Reactions  . Codeine Sulfate     REACTION: nausea,itching     Review of Systems  Constitutional: Negative for fatigue.  Eyes: Negative for visual disturbance.  Respiratory: Negative for cough, chest tightness and shortness of breath.   Cardiovascular: Negative for chest pain, palpitations and leg swelling.  Musculoskeletal: Positive for back pain.  Neurological: Negative for dizziness, syncope, weakness, light-headedness and headaches.  Psychiatric/Behavioral: Negative for dysphoric mood and suicidal ideas. The patient is nervous/anxious.        Objective:   Physical Exam  Constitutional: He is oriented to person, place, and time. He appears well-developed and well-nourished.  Cardiovascular: Normal rate and regular rhythm.   Pulmonary/Chest: Effort normal and breath sounds normal. No respiratory distress. He has no wheezes. He has no rales.  Musculoskeletal:  Significant varicosities in both legs. No pitting edema  Neurological: He is alert and oriented to person, place, and time.       Assessment:     #1 hypertension stable and at goal  #2 history of mild hypokalemia  #3 history of chronic GERD on PPI  #4 chronic anxiety disorder with probable panic disorder    Plan:     -Check basic metabolic panel -Patient strongly encouraged to lose some weight -Consider setting up complete physical -We had a discussion regarding SSRI use in prevention of panic disorder but has been intolerant of many SSRIs  in the past -Refill clonazepam for 6 months  Joseph CoveyBruce W Lehi Phifer MD [facility]

## 2016-02-09 NOTE — Progress Notes (Signed)
Pre visit review using our clinic review tool, if applicable. No additional management support is needed unless otherwise documented below in the visit note. 

## 2016-02-09 NOTE — Patient Instructions (Signed)

## 2016-02-10 LAB — BASIC METABOLIC PANEL
BUN: 26 mg/dL — ABNORMAL HIGH (ref 6–23)
CHLORIDE: 94 meq/L — AB (ref 96–112)
CO2: 27 meq/L (ref 19–32)
CREATININE: 2.24 mg/dL — AB (ref 0.40–1.50)
Calcium: 10 mg/dL (ref 8.4–10.5)
GFR: 34.95 mL/min — ABNORMAL LOW (ref >60.00)
GLUCOSE: 87 mg/dL (ref 70–99)
Potassium: 4.5 mEq/L (ref 3.5–5.1)
Sodium: 133 mEq/L — ABNORMAL LOW (ref 135–145)

## 2016-02-11 NOTE — Addendum Note (Signed)
Addended by: Johnella MoloneyFUNDERBURK, JO A on: 02/11/2016 09:55 AM   Modules accepted: Orders

## 2016-02-16 ENCOUNTER — Other Ambulatory Visit: Payer: Self-pay

## 2016-05-22 ENCOUNTER — Other Ambulatory Visit: Payer: Self-pay | Admitting: Family Medicine

## 2016-06-20 ENCOUNTER — Other Ambulatory Visit: Payer: Self-pay | Admitting: Internal Medicine

## 2016-07-19 ENCOUNTER — Encounter: Payer: Self-pay | Admitting: Internal Medicine

## 2016-08-08 ENCOUNTER — Other Ambulatory Visit: Payer: Self-pay | Admitting: Family Medicine

## 2016-08-08 NOTE — Telephone Encounter (Signed)
Last refill was given at office visit 02/09/16.  Okay to fill?

## 2016-08-08 NOTE — Telephone Encounter (Signed)
Refill for 6 months. 

## 2016-11-17 ENCOUNTER — Encounter: Payer: Self-pay | Admitting: Family Medicine

## 2017-01-30 ENCOUNTER — Other Ambulatory Visit: Payer: Self-pay | Admitting: Family Medicine

## 2017-01-30 NOTE — Telephone Encounter (Signed)
Last refill 08/08/16 and last office visit 02/09/16.  Okay to fill?

## 2017-01-30 NOTE — Telephone Encounter (Signed)
Refill once.    Needs office follow up within one month to reassess.

## 2017-01-31 ENCOUNTER — Telehealth: Payer: Self-pay | Admitting: Family Medicine

## 2017-01-31 NOTE — Telephone Encounter (Signed)
Copied from CRM [id number]. Topic: Quick Communication - See Telephone Encounter >> Jan 31, 2017  5:36 PM Cipriano BunkerLambe, Annette S wrote: CRM for notification. See Telephone encounter for:   Patient called needing refill for Clonazapam, having anxiety.    Set appt up for 03/03/17 (waiting for ins. 1st of the year).  Please let patient know on refill 01/31/17.

## 2017-02-01 NOTE — Telephone Encounter (Signed)
Last refill 08/08/16 and last office visit 02/09/16.  Okay to refill?

## 2017-02-01 NOTE — Telephone Encounter (Signed)
This was answered 2 days ago.  Refill once and office follow up within one month to reassess

## 2017-02-01 NOTE — Telephone Encounter (Signed)
Controlled substance Rx

## 2017-02-01 NOTE — Telephone Encounter (Signed)
Pt following up on refill request. Pt states he has one pill left for tonite

## 2017-02-01 NOTE — Telephone Encounter (Signed)
Pharmacy has not received medication. He uses [id number] - [place], [place] - [address]. Please call pt with update and when medication is called in.  Pt cb number is [phone]

## 2017-02-03 NOTE — Telephone Encounter (Signed)
Spoke with pharmacist and prescription was picked up 02/02/17.

## 2017-03-03 ENCOUNTER — Encounter: Payer: Self-pay | Admitting: Family Medicine

## 2017-03-03 ENCOUNTER — Ambulatory Visit (INDEPENDENT_AMBULATORY_CARE_PROVIDER_SITE_OTHER): Payer: Self-pay | Admitting: Family Medicine

## 2017-03-03 VITALS — BP 140/90 | HR 74 | Temp 98.5°F | Wt 339.0 lb

## 2017-03-03 DIAGNOSIS — F411 Generalized anxiety disorder: Secondary | ICD-10-CM

## 2017-03-03 DIAGNOSIS — Z833 Family history of diabetes mellitus: Secondary | ICD-10-CM

## 2017-03-03 DIAGNOSIS — K219 Gastro-esophageal reflux disease without esophagitis: Secondary | ICD-10-CM

## 2017-03-03 DIAGNOSIS — I10 Essential (primary) hypertension: Secondary | ICD-10-CM

## 2017-03-03 MED ORDER — CLONAZEPAM 1 MG PO TABS: ORAL_TABLET | ORAL | 5 refills | Status: DC

## 2017-03-03 NOTE — Progress Notes (Signed)
Subjective:     Patient ID: Joseph Valenzuela, male   DOB: 04/22/1975, 42 y.o.   MRN: [id number]  HPI Patient here for medical follow-up. His chronic problems include past history of alcohol abuse, obesity, hypertension, chronic anxiety, fatty liver changes, history of depression, GERD  Remains on lisinopril HCTZ. He has lost about 8 pounds since last year. Continues to feel very anxious. He tried multiple SSRI medications and atypical antidepressant medications without improvement. Has been on Klonopin regimen for years. His GERD is controlled currently with Prilosec 20 mg twice a day.  Strong family history of type 2 diabetes. Mother had type 2 diabetes. Patient's had elevated nonfasting glucose in the past. Does have some polyuria. No polydipsia.  Past Medical History:  Diagnosis Date  . Alcoholism (HCC)   . Anxiety   . Asthma   . Back pain, chronic   . Barrett esophagus   . Benign positional vertigo   . Candidiasis of mouth   . Degenerative spinal arthritis   . Depression   . Esophageal reflux   . Fatty liver   . Herniated disc   . Hiatal hernia   . Hypertension   . Irritable bowel syndrome   . Obesity   . Spleen enlarged   . Suicidal ideations   . Vitamin B12 deficiency    Past Surgical History:  Procedure Laterality Date  . TYMPANOSTOMY TUBE PLACEMENT    . UPPER GASTROINTESTINAL ENDOSCOPY      reports that  has never smoked. he has never used smokeless tobacco. He reports that he drinks alcohol. He reports that he does not use drugs. family history includes Alcohol abuse in his father; Brain cancer in his maternal grandfather; Depression in his mother; Diabetes in his maternal aunt and mother; Hypertension in his brother; Stomach cancer in his maternal grandmother. Allergies  Allergen Reactions  . Codeine Sulfate     REACTION: nausea,itching     Review of Systems  Constitutional: Negative for fatigue.  Eyes: Negative for visual disturbance.  Respiratory: Negative  for cough, chest tightness and shortness of breath.   Cardiovascular: Negative for chest pain, palpitations and leg swelling.  Endocrine: Positive for polyuria. Negative for polydipsia.  Neurological: Negative for dizziness, syncope, weakness, light-headedness and headaches.       Objective:   Physical Exam  Constitutional: He is oriented to person, place, and time. He appears well-developed and well-nourished.  HENT:  Right Ear: External ear normal.  Left Ear: External ear normal.  Mouth/Throat: Oropharynx is clear and moist.  Eyes: Pupils are equal, round, and reactive to light.  Neck: Neck supple. No thyromegaly present.  Cardiovascular: Normal rate and regular rhythm.  Pulmonary/Chest: Effort normal and breath sounds normal. No respiratory distress. He has no wheezes. He has no rales.  Musculoskeletal: He exhibits no edema.  Neurological: He is alert and oriented to person, place, and time.       Assessment:     #1 obesity-high risk for type 2 diabetes  #2 GERD controlled with Prilosec  #3 hypertension poorly controlled by today's reading  #4 strong family history type 2 diabetes  #5 history of chronic anxiety    Plan:     -He is encouraged to lose some weight -Return for fasting labs with comprehensive medical panel and A1c -Refill clonazepam for 6 months-we have discussed alternatives but he's tried multiple medications in the past. We discussed potential long-term risk of clonazepam including potential for cognitive decline/memory loss  Kristian CoveyBruce W Hafsah Hendler MD  [facility] at [facility]

## 2017-03-03 NOTE — Patient Instructions (Signed)
Try to lose some weight Set up to return for labs.

## 2017-03-20 ENCOUNTER — Other Ambulatory Visit: Payer: Self-pay

## 2017-03-20 ENCOUNTER — Other Ambulatory Visit (INDEPENDENT_AMBULATORY_CARE_PROVIDER_SITE_OTHER): Payer: Self-pay

## 2017-03-20 DIAGNOSIS — I10 Essential (primary) hypertension: Secondary | ICD-10-CM

## 2017-03-20 DIAGNOSIS — Z833 Family history of diabetes mellitus: Secondary | ICD-10-CM

## 2017-03-20 LAB — COMPREHENSIVE METABOLIC PANEL
ALBUMIN: 4.2 g/dL (ref 3.5–5.2)
ALT: 30 U/L (ref 0–53)
AST: 41 U/L — AB (ref 0–37)
Alkaline Phosphatase: 93 U/L (ref 39–117)
BILIRUBIN TOTAL: 1.3 mg/dL — AB (ref 0.2–1.2)
BUN: 12 mg/dL (ref 6–23)
CALCIUM: 9.4 mg/dL (ref 8.4–10.5)
CO2: 32 meq/L (ref 19–32)
CREATININE: 0.66 mg/dL (ref 0.40–1.50)
Chloride: 95 mEq/L — ABNORMAL LOW (ref 96–112)
GFR: 142.36 mL/min (ref >60.00)
Glucose, Bld: 93 mg/dL (ref 70–99)
Potassium: 3.8 mEq/L (ref 3.5–5.1)
Sodium: 136 mEq/L (ref 135–145)
Total Protein: 7.1 g/dL (ref 6.0–8.3)

## 2017-03-20 LAB — HEMOGLOBIN A1C: Hgb A1c MFr Bld: 5.8 % (ref 4.6–6.5)

## 2017-06-19 ENCOUNTER — Other Ambulatory Visit: Payer: Self-pay | Admitting: Family Medicine

## 2017-08-21 ENCOUNTER — Other Ambulatory Visit: Payer: Self-pay | Admitting: Family Medicine

## 2017-08-22 NOTE — Telephone Encounter (Signed)
Refill once   Needs follow up.   

## 2017-08-22 NOTE — Telephone Encounter (Signed)
Last refill given at office visit 03/03/17.  Okay to fill?

## 2017-09-19 ENCOUNTER — Ambulatory Visit (INDEPENDENT_AMBULATORY_CARE_PROVIDER_SITE_OTHER): Payer: Self-pay | Admitting: Family Medicine

## 2017-09-19 VITALS — BP 128/84 | HR 74 | Temp 98.4°F | Wt 332.0 lb

## 2017-09-19 DIAGNOSIS — F418 Other specified anxiety disorders: Secondary | ICD-10-CM

## 2017-09-19 MED ORDER — CLONAZEPAM 1 MG PO TABS: ORAL_TABLET | ORAL | 3 refills | Status: DC

## 2017-09-19 NOTE — Progress Notes (Signed)
  Subjective:     Patient ID: Joseph Valenzuela, male   DOB: 05/18/1975, 42 y.o.   MRN: [id number]  HPI Pt has chronic problems including hypertension, GERD, obesity, chronic anxiety.  He is seen today with some recent increased stress issues  Has been married 21 years and his wife just notified him Saturday that she wants a divorce.  He has had great difficulty sleeping since then.  Takes Klonopin tid and has been on this for years.  He has good support from one brother and his mother.  No suicidal ideation.  He has some concerns financially about how he will support himself.  Has not worked in several year.  He has some chronic back pain.  Past Medical History:  Diagnosis Date  . Alcoholism (HCC)   . Anxiety   . Asthma   . Back pain, chronic   . Barrett esophagus   . Benign positional vertigo   . Candidiasis of mouth   . Degenerative spinal arthritis   . Depression   . Esophageal reflux   . Fatty liver   . Herniated disc   . Hiatal hernia   . Hypertension   . Irritable bowel syndrome   . Obesity   . Spleen enlarged   . Suicidal ideations   . Vitamin B12 deficiency    Past Surgical History:  Procedure Laterality Date  . TYMPANOSTOMY TUBE PLACEMENT    . UPPER GASTROINTESTINAL ENDOSCOPY      reports that he has never smoked. He has never used smokeless tobacco. He reports that he drinks alcohol. He reports that he does not use drugs. family history includes Alcohol abuse in his father; Brain cancer in his maternal grandfather; Depression in his mother; Diabetes in his maternal aunt and mother; Hypertension in his brother; Stomach cancer in his maternal grandmother. Allergies  Allergen Reactions  . Codeine Sulfate     REACTION: nausea,itching     Review of Systems  Respiratory: Negative for shortness of breath.   Cardiovascular: Negative for chest pain.  Gastrointestinal: Negative for abdominal pain.  Musculoskeletal: Positive for back pain.  Neurological: Negative for  headaches.  Psychiatric/Behavioral: Positive for sleep disturbance. Negative for agitation and suicidal ideas.       Objective:   Physical Exam  Constitutional: He appears well-developed and well-nourished.  Cardiovascular: Normal rate and regular rhythm.  Pulmonary/Chest: Effort normal and breath sounds normal.  Psychiatric: He has a normal mood and affect. His behavior is normal. Judgment and thought content normal.       Assessment:     Situational stress with wife leaving him over weekend after 21 years.  He has some chronic anxiety.    Plan:     -recommend he consider counseling and he declines at this time.   -discussed stress management. -try to engage in more regular exercise. -refilled Klonopin for 6 months.  Kristian CoveyBruce W Kveon Casanas MD [facility]

## 2017-09-19 NOTE — Patient Instructions (Signed)
Monitor blood pressure and be in touch if consistently > 140/90  I think your idea to start exercising more and losing weight is great.

## 2017-09-29 ENCOUNTER — Encounter: Payer: Self-pay | Admitting: Family Medicine

## 2017-09-29 ENCOUNTER — Ambulatory Visit (INDEPENDENT_AMBULATORY_CARE_PROVIDER_SITE_OTHER): Payer: Self-pay | Admitting: Family Medicine

## 2017-09-29 VITALS — BP 120/90 | HR 91 | Temp 97.8°F | Wt 332.6 lb

## 2017-09-29 DIAGNOSIS — M545 Low back pain, unspecified: Secondary | ICD-10-CM

## 2017-09-29 MED ORDER — METHOCARBAMOL 750 MG PO TABS: 750.0000 mg | ORAL_TABLET | Freq: Three times a day (TID) | ORAL | 1 refills | Status: DC | PRN

## 2017-09-29 NOTE — Patient Instructions (Signed)
Continue with some topical heat  Caution with the muscle relaxer which can cause sedation.

## 2017-09-29 NOTE — Progress Notes (Signed)
  Subjective:     Patient ID: Joseph Valenzuela, male   DOB: 10/23/1975, 42 y.o.   MRN: [id number]  HPI Patient seen with diffuse upper to mid lumbar pain bilaterally. Started a new job yesterday and after working just a few hours had increased pain diffusely and stiffness. He has some chronic back pain in the past but no history of back surgery. He's tried several things including ibuprofen, TENS unit, heat, and ice without much improvement. Denies any radiculitis symptoms. No lower extremity numbness or weakness. Rates pain 7 out of 10.  His new job will require fair amount of bending and lifting.  Past Medical History:  Diagnosis Date  . Alcoholism (HCC)   . Anxiety   . Asthma   . Back pain, chronic   . Barrett esophagus   . Benign positional vertigo   . Candidiasis of mouth   . Degenerative spinal arthritis   . Depression   . Esophageal reflux   . Fatty liver   . Herniated disc   . Hiatal hernia   . Hypertension   . Irritable bowel syndrome   . Obesity   . Spleen enlarged   . Suicidal ideations   . Vitamin B12 deficiency    Past Surgical History:  Procedure Laterality Date  . TYMPANOSTOMY TUBE PLACEMENT    . UPPER GASTROINTESTINAL ENDOSCOPY      reports that he has never smoked. He has never used smokeless tobacco. He reports that he drinks alcohol. He reports that he does not use drugs. family history includes Alcohol abuse in his father; Brain cancer in his maternal grandfather; Depression in his mother; Diabetes in his maternal aunt and mother; Hypertension in his brother; Stomach cancer in his maternal grandmother. No Active Allergies   Review of Systems  Constitutional: Negative for chills and fever.  Gastrointestinal: Negative for abdominal pain.  Musculoskeletal: Positive for back pain.  Neurological: Negative for weakness and numbness.       Objective:   Physical Exam  Constitutional: He appears well-developed and well-nourished.  Cardiovascular: Normal  rate and regular rhythm.  Pulmonary/Chest: Effort normal and breath sounds normal.  Musculoskeletal:  Diffuse tenderness upper to mid lumbar area bilaterally. Straight leg raise is negative  Neurological:  No focal strength deficits       Assessment:     Acute on chronic low back pain. Onset yesterday after starting work now with bilateral lumbar back pain. Suspect muscular.    Plan:     -Continue heat and/or ice -Patient is aware of some stretches from prior therapy -Robaxin 750 mg one every 8 hours as needed for muscle spasm -Work note written from today through Tuesday to be out. Consider physical therapy if not improving with the above  Kristian CoveyBruce W Amerah Puleo MD [facility]

## 2017-11-16 ENCOUNTER — Other Ambulatory Visit: Payer: Self-pay | Admitting: Family Medicine

## 2017-12-15 ENCOUNTER — Other Ambulatory Visit: Payer: Self-pay | Admitting: Family Medicine

## 2018-01-01 ENCOUNTER — Emergency Department (HOSPITAL_COMMUNITY)
Admission: EM | Admit: 2018-01-01 | Discharge: 2018-01-01 | Disposition: A | Discharge status: Home / Self Care | Payer: Self-pay | Attending: Emergency Medicine | Admitting: Emergency Medicine

## 2018-01-01 ENCOUNTER — Emergency Department (HOSPITAL_COMMUNITY): Payer: Self-pay

## 2018-01-01 ENCOUNTER — Encounter (HOSPITAL_COMMUNITY): Payer: Self-pay | Admitting: Emergency Medicine

## 2018-01-01 DIAGNOSIS — S2232XA Fracture of one rib, left side, initial encounter for closed fracture: Secondary | ICD-10-CM | POA: Insufficient documentation

## 2018-01-01 DIAGNOSIS — J45909 Unspecified asthma, uncomplicated: Secondary | ICD-10-CM | POA: Insufficient documentation

## 2018-01-01 DIAGNOSIS — Z79899 Other long term (current) drug therapy: Secondary | ICD-10-CM | POA: Insufficient documentation

## 2018-01-01 DIAGNOSIS — Y929 Unspecified place or not applicable: Secondary | ICD-10-CM | POA: Insufficient documentation

## 2018-01-01 DIAGNOSIS — I10 Essential (primary) hypertension: Secondary | ICD-10-CM | POA: Insufficient documentation

## 2018-01-01 DIAGNOSIS — W01198A Fall on same level from slipping, tripping and stumbling with subsequent striking against other object, initial encounter: Secondary | ICD-10-CM | POA: Insufficient documentation

## 2018-01-01 DIAGNOSIS — Y9301 Activity, walking, marching and hiking: Secondary | ICD-10-CM | POA: Insufficient documentation

## 2018-01-01 DIAGNOSIS — Y999 Unspecified external cause status: Secondary | ICD-10-CM | POA: Insufficient documentation

## 2018-01-01 DIAGNOSIS — W19XXXA Unspecified fall, initial encounter: Secondary | ICD-10-CM

## 2018-01-01 LAB — CBC WITH DIFFERENTIAL/PLATELET
Abs Immature Granulocytes: 0.02 10*3/uL (ref 0.00–0.07)
BASOS ABS: 0.1 10*3/uL (ref 0.0–0.1)
BASOS PCT: 1 %
EOS ABS: 0 10*3/uL (ref 0.0–0.5)
EOS PCT: 1 %
HEMATOCRIT: 44.3 % (ref 39.0–52.0)
Hemoglobin: 14.3 g/dL (ref 13.0–17.0)
IMMATURE GRANULOCYTES: 0 %
LYMPHS ABS: 2.3 10*3/uL (ref 0.7–4.0)
Lymphocytes Relative: 28 %
MCH: 28.3 pg (ref 26.0–34.0)
MCHC: 32.3 g/dL (ref 30.0–36.0)
MCV: 87.7 fL (ref 80.0–100.0)
MONOS PCT: 7 %
Monocytes Absolute: 0.6 10*3/uL (ref 0.1–1.0)
NEUTROS PCT: 63 %
NRBC: 0 % (ref 0.0–0.2)
Neutro Abs: 5.1 10*3/uL (ref 1.7–7.7)
PLATELETS: 276 10*3/uL (ref 150–400)
RBC: 5.05 MIL/uL (ref 4.22–5.81)
RDW: 15.7 % — AB (ref 11.5–15.5)
WBC: 8.1 10*3/uL (ref 4.0–10.5)

## 2018-01-01 LAB — BASIC METABOLIC PANEL
ANION GAP: 15 (ref 5–15)
BUN: 9 mg/dL (ref 6–20)
CALCIUM: 9.8 mg/dL (ref 8.9–10.3)
CO2: 26 mmol/L (ref 22–32)
Chloride: 97 mmol/L — ABNORMAL LOW (ref 98–111)
Creatinine, Ser: 0.89 mg/dL (ref 0.61–1.24)
GFR calc Af Amer: >60 mL/min (ref >60)
GLUCOSE: 118 mg/dL — AB (ref 70–99)
Potassium: 3.9 mmol/L (ref 3.5–5.1)
Sodium: 138 mmol/L (ref 135–145)

## 2018-01-01 LAB — I-STAT TROPONIN, ED: Troponin i, poc: 0 ng/mL (ref 0.00–0.08)

## 2018-01-01 LAB — I-STAT CHEM 8, ED
BUN: 7 mg/dL (ref 6–20)
CALCIUM ION: 1.11 mmol/L — AB (ref 1.15–1.40)
CHLORIDE: 98 mmol/L (ref 98–111)
Creatinine, Ser: 0.9 mg/dL (ref 0.61–1.24)
GLUCOSE: 120 mg/dL — AB (ref 70–99)
HCT: 45 % (ref 39.0–52.0)
Hemoglobin: 15.3 g/dL (ref 13.0–17.0)
POTASSIUM: 3.9 mmol/L (ref 3.5–5.1)
Sodium: 136 mmol/L (ref 135–145)
TCO2: 27 mmol/L (ref 22–32)

## 2018-01-01 LAB — CBG MONITORING, ED: Glucose-Capillary: 128 mg/dL — ABNORMAL HIGH (ref 70–99)

## 2018-01-01 MED ORDER — IOPAMIDOL (ISOVUE-370) INJECTION 76%
100.0000 mL | Freq: Once | INTRAVENOUS | Status: AC | PRN
  Administered 2018-01-01: 100 mL via INTRAVENOUS

## 2018-01-01 MED ORDER — OXYCODONE-ACETAMINOPHEN 7.5-325 MG PO TABS
1.0000 | ORAL_TABLET | Freq: Once | ORAL | Status: AC
  Administered 2018-01-01: 1 via ORAL
  Filled 2018-01-01: qty 1

## 2018-01-01 MED ORDER — SODIUM CHLORIDE 0.9 % IV BOLUS
1000.0000 mL | Freq: Once | INTRAVENOUS | Status: AC
  Administered 2018-01-01: 1000 mL via INTRAVENOUS

## 2018-01-01 MED ORDER — SODIUM CHLORIDE 0.9 % IV BOLUS
500.0000 mL | Freq: Once | INTRAVENOUS | Status: AC
  Administered 2018-01-01: 500 mL via INTRAVENOUS

## 2018-01-01 MED ORDER — OXYCODONE-ACETAMINOPHEN 10-325 MG PO TABS: 1.0000 | ORAL_TABLET | Freq: Three times a day (TID) | ORAL | 0 refills | Status: DC | PRN

## 2018-01-01 MED ORDER — HYDROCODONE-ACETAMINOPHEN 5-325 MG PO TABS
1.0000 | ORAL_TABLET | Freq: Once | ORAL | Status: AC
  Administered 2018-01-01: 1 via ORAL
  Filled 2018-01-01: qty 1

## 2018-01-01 MED ORDER — IOPAMIDOL (ISOVUE-370) INJECTION 76%
INTRAVENOUS | Status: AC
  Filled 2018-01-01: qty 100

## 2018-01-01 MED ORDER — SODIUM CHLORIDE 0.9 % IJ SOLN
INTRAMUSCULAR | Status: AC
  Filled 2018-01-01: qty 50

## 2018-01-01 NOTE — ED Notes (Signed)
Pt and X-ray tech stated that pt had a near collapse in X-ray not sure if it was r/t pain. Provider made aware.

## 2018-01-01 NOTE — ED Notes (Signed)
CT notified pt was ready and now has 18g LAC

## 2018-01-01 NOTE — Discharge Instructions (Addendum)
You may take Tylenol 500 mg every 6 hours for your pain.  You may also take 600 mg of ibuprofen every 6 hours for your pain.  If you are having breakthrough pain you may take the Percocet every 8 hours, up to 3 times per day.  Do not take more than 5000 mg of Tylenol in 1 day.  Do not drive, work or Advertising copywriter while taking Percocet as it can make you very drowsy.  Percocet may also cause constipation, if you become constipated please take MiraLAX.    You should use the incentive spirometer 10 times an hour every hour while you are awake to help prevent pneumonia.  Please follow-up with your regular doctor in 1 week for reevaluation.  If you develop fevers, chills, cough, shortness of breath, chest pain or uncontrolled pain then you should return to the emergency department immediately.

## 2018-01-01 NOTE — ED Provider Notes (Signed)
[facility]-EMERGENCY DEPT Provider Note   CSN: [id number] Arrival date & time: 01/01/18  1159     History   Chief Complaint Chief Complaint  Patient presents with  . Fall  . rib cage pain    HPI Joseph Valenzuela is a 42 y.o. male.  HPI   Patient is a 42 year old male with a history of alcoholism, anxiety, chronic back pain, depression, hypertension, obesity, who presents the emergency department today for evaluation of left anterior chest wall pain that began about a week ago.  Patient states that one week ago he tripped in a hole and fell onto the corner of his laptop bag.  Since then he has had constant pain to the left rib cage.  Pain is worse with movement of his left upper extremity and with breathing.  He denies any other known injuries or pain from the fall.  He does not think that he hit his head.  Denies headaches, nausea, vomiting, numbness or weakness to the arms or legs. Pt reports decreased PO intake for the last several days.   Past Medical History:  Diagnosis Date  . Alcoholism (HCC)   . Anxiety   . Asthma   . Back pain, chronic   . Barrett esophagus   . Benign positional vertigo   . Candidiasis of mouth   . Degenerative spinal arthritis   . Depression   . Esophageal reflux   . Fatty liver   . Herniated disc   . Hiatal hernia   . Hypertension   . Irritable bowel syndrome   . Obesity   . Spleen enlarged   . Suicidal ideations   . Vitamin B12 deficiency     Patient Active Problem List   Diagnosis Date Noted  . Spleen enlargement 06/17/2013  . Abdominal pain, unspecified site 06/17/2013  . Erectile dysfunction 03/26/2013  . Severe obesity (BMI >= 40) (HCC) 03/26/2013  . Morbid obesity (HCC) 10/01/2010  . GERD (gastroesophageal reflux disease) 10/01/2010  . Anxiety and depression 10/01/2010  . Hypertension 10/01/2010  . BACK PAIN, CHRONIC 09/29/2009  . Vitamin B 12 deficiency 09/16/2009  . CHEST PAIN UNSPECIFIED 09/16/2009  .  EDEMA 09/15/2009  . HERNIATED DISC 12/08/2008  . DISC DISEASE, LUMBAR 08/16/2008  . ASTHMA 03/11/2008  . History of alcohol abuse 03/06/2008  . EXTRINSIC ASTHMA, UNSPECIFIED 03/01/2008  . DEPRESSION 09/18/2007  . Obesity 07/31/2007  . GERD 07/03/2007  . IRRITABLE BOWEL SYNDROME 07/03/2007  . Fatty liver 07/03/2007  . ABDOMINAL PAIN-EPIGASTRIC 07/03/2007  . BENIGN POSITIONAL VERTIGO 05/21/2007  . Anxiety state 04/26/2007  . HYPERTENSION 04/26/2007    Past Surgical History:  Procedure Laterality Date  . TYMPANOSTOMY TUBE PLACEMENT    . UPPER GASTROINTESTINAL ENDOSCOPY        Home Medications    Prior to Admission medications   Medication Sig Start Date End Date Taking? Authorizing Provider  clonazePAM (KLONOPIN) 1 MG tablet TAKE 1 TABLET BY MOUTH 3 TIMES A DAY AS NEEDED FOR ANXIETY.  May refill on or after 09-22-17 09/19/17  Yes Burchette, Elberta Fortis, MD  lisinopril-hydrochlorothiazide (PRINZIDE,ZESTORETIC) 20-12.5 MG tablet TAKE 1 TABLET BY MOUTH EVERY DAY 12/15/17  Yes Burchette, Elberta Fortis, MD  methocarbamol (ROBAXIN-750) 750 MG tablet Take 1 tablet (750 mg total) by mouth every 8 (eight) hours as needed for muscle spasms. 09/29/17  Yes Burchette, Elberta Fortis, MD  omeprazole (PRILOSEC) 20 MG capsule Take 20 mg by mouth daily.   Yes [provider]  oxyCODONE-acetaminophen (PERCOCET)  10-325 MG tablet Take 1 tablet by mouth every 8 (eight) hours as needed for pain. 01/01/18   Elvan Ebron S, PA-C    Family History Family History  Problem Relation Age of Onset  . Alcohol abuse Father   . Depression Mother   . Diabetes Mother   . Hypertension Brother   . Diabetes Maternal Aunt   . Stomach cancer Maternal Grandmother        great  . Brain cancer Maternal Grandfather   . Colon cancer Neg Hx   . Esophageal cancer Neg Hx     Social History Social History   Tobacco Use  . Smoking status: Never Smoker  . Smokeless tobacco: Never Used  Substance Use Topics  . Alcohol use:  Yes    Alcohol/week: 0.0 standard drinks    Comment: drink twice a week per pt.  . Drug use: No    Types: Marijuana    Comment: previous hx     Allergies   Patient has no known allergies.   Review of Systems Review of Systems  Constitutional: Negative for chills and fever.  HENT: Negative for congestion.   Eyes: Negative for visual disturbance.  Respiratory: Positive for cough and shortness of breath.   Cardiovascular: Positive for chest pain. Negative for leg swelling.  Gastrointestinal: Negative for abdominal pain, nausea and vomiting.  Genitourinary: Negative for flank pain.  Musculoskeletal: Positive for back pain (chronic).  Skin: Negative for wound.  Neurological: Positive for light-headedness. Negative for weakness, numbness and headaches.     Physical Exam Updated Vital Signs BP (!) 144/97   Pulse 82   Temp 98.2 F (36.8 C) (Oral)   Resp 18   Ht 5\' 10"  (1.778 m)   Wt (!) 145.2 kg   SpO2 97%   BMI 45.92 kg/m   Physical Exam  Constitutional: He appears well-developed and well-nourished.  Appears uncomfortable  HENT:  Head: Normocephalic and atraumatic.  Lips are dry and cracked  Eyes: Conjunctivae are normal.  Neck: Neck supple.  Cardiovascular: Regular rhythm.  No murmur heard. Borderline tachycardic on monitor  Pulmonary/Chest: Effort normal and breath sounds normal. No respiratory distress. He has no wheezes. He has no rales.  TTP to the mid left anterior and lateral chest wall that reproduces pain. No obvious stepoff, ecchymosis or deformity.  Abdominal: Soft. Bowel sounds are normal. There is no tenderness.  Musculoskeletal: He exhibits no edema.  Neurological: He is alert.  Moving all extremities  Skin: Skin is warm and dry.  Psychiatric: He has a normal mood and affect.  Nursing note and vitals reviewed.    ED Treatments / Results  Labs (all labs ordered are listed, but only abnormal results are displayed) Labs Reviewed  CBC WITH  DIFFERENTIAL/PLATELET - Abnormal; Notable for the following components:      Result Value   RDW 15.7 (*)    All other components within normal limits  BASIC METABOLIC PANEL - Abnormal; Notable for the following components:   Chloride 97 (*)    Glucose, Bld 118 (*)    All other components within normal limits  CBG MONITORING, ED - Abnormal; Notable for the following components:   Glucose-Capillary 128 (*)    All other components within normal limits  I-STAT CHEM 8, ED - Abnormal; Notable for the following components:   Glucose, Bld 120 (*)    Calcium, Ion 1.11 (*)    All other components within normal limits  I-STAT TROPONIN, ED    EKG  EKG Interpretation  Date/Time:  Monday January 01 2018 12:07:42 EST Ventricular Rate:  96 PR Interval:    QRS Duration: 101 QT Interval:  357 QTC Calculation: 452 R Axis:   32 Text Interpretation:  Sinus rhythm Low voltage, extremity and precordial leads No significant change since last tracing Confirmed by Alvira Monday ([id number]) on 01/01/2018 2:00:30 PM  EKG: normal EKG, normal sinus rhythm, HR 96, no ischemic changes.   Radiology Dg Ribs Unilateral W/chest Left  Result Date: 01/01/2018 CLINICAL DATA:  Fall 1 week ago.  Left anterior lower rib pain. EXAM: LEFT RIBS AND CHEST - 3+ VIEW COMPARISON:  03/11/2008 FINDINGS: Acute fracture involves the anterolateral aspect of the left 6 rib. There is no evidence of pneumothorax or pleural effusion. Both lungs are clear. Heart size and mediastinal contours are within normal limits. IMPRESSION: 1. Left anterior sixth rib fracture. Electronically Signed   By: Signa Kell M.D.   On: 01/01/2018 12:47   Ct Angio Chest Pe W And/or Wo Contrast  Addendum Date: 01/01/2018   ADDENDUM REPORT: 01/01/2018 15:42 ADDENDUM: There is noted minimally displaced fracture involving the anterior portion of the left sixth rib. These results were called by telephone at the time of interpretation on 01/01/2018 at 3:42 pm to  Dr. Leonia Corona , who verbally acknowledged these results. Electronically Signed   By: Lupita Raider, M.D.   On: 01/01/2018 15:42   Result Date: 01/01/2018 CLINICAL DATA:  Left rib cage pain after fall last week. EXAM: CT ANGIOGRAPHY CHEST WITH CONTRAST TECHNIQUE: Multidetector CT imaging of the chest was performed using the standard protocol during bolus administration of intravenous contrast. Multiplanar CT image reconstructions and MIPs were obtained to evaluate the vascular anatomy. CONTRAST:  ISOVUE-370 IOPAMIDOL (ISOVUE-370) INJECTION 76% COMPARISON:  Radiographs of same day. FINDINGS: Cardiovascular: Satisfactory opacification of the pulmonary arteries to the segmental level. No evidence of pulmonary embolism. Normal heart size. No pericardial effusion. Mediastinum/Nodes: No enlarged mediastinal, hilar, or axillary lymph nodes. Thyroid gland, trachea, and esophagus demonstrate no significant findings. Lungs/Pleura: Lungs are clear. No pleural effusion or pneumothorax. Upper Abdomen: Mild cholelithiasis is noted. Musculoskeletal: No chest wall abnormality. No acute or significant osseous findings. Review of the MIP images confirms the above findings. IMPRESSION: No definite evidence of pulmonary embolus. Mild cholelithiasis. No acute abnormality seen in the chest. Electronically Signed: By: Lupita Raider, M.D. On: 01/01/2018 15:27    Procedures Procedures (including critical care time)  Provided definitive fracture care for patient.  Advised Tylenol and ibuprofen for pain.  Advised cold compresses for pain as well.  Advised incentive spirometry 10x/hour every hour while awake.    Medications Ordered in ED Medications  sodium chloride 0.9 % injection (has no administration in time range)  iopamidol (ISOVUE-370) 76 % injection (has no administration in time range)  oxyCODONE-acetaminophen (PERCOCET) 7.5-325 MG per tablet 1 tablet (1 tablet Oral Given 01/01/18 1316)  sodium chloride  0.9 % bolus 1,000 mL ( Intravenous Stopped 01/01/18 1558)  iopamidol (ISOVUE-370) 76 % injection 100 mL (100 mLs Intravenous Contrast Given 01/01/18 1500)  sodium chloride 0.9 % bolus 500 mL (500 mLs Intravenous New Bag/Given 01/01/18 1600)  HYDROcodone-acetaminophen (NORCO/VICODIN) 5-325 MG per tablet 1 tablet (1 tablet Oral Given 01/01/18 1624)    Initial Impression / Assessment and Plan / ED Course  I have reviewed the triage vital signs and the nursing notes.  Pertinent labs & imaging results that were available during my care of the patient were reviewed  by me and considered in my medical decision making (see chart for details).  Reviewed patient in [place] narcotic database and there are no red flags.     Final Clinical Impressions(s) / ED Diagnoses   Final diagnoses:  Fall, initial encounter  Closed fracture of one rib of left side, initial encounter   Patient presenting with left anterior rib pain that began after he had a mechanical fall and landed on his laptop 1 week ago.  Denies any other injuries from the fall.  Denies known head trauma or neuro symptoms.  Has had worsening pain with inspiration.  Has not taken pain medications at home to help with symptoms.  Borderline tachycardic on arrival, lungs clear to auscultation bilaterally.  Chest x-ray shows no pneumothorax or pneumonia, but patient does have a minimally displaced left sixth rib fracture.  Patient was given a dose of pain medications.  After he received pain medications he became diaphoretic and felt somewhat lightheaded.  Does admit to not eating and drinking at all over the last several days.  Reports that he is still in significant pain. Will order labs, fluids and recheck EKG. Will also order further imaging of the chest.  Lab work is very reassuring.  Hemoglobin is normal, no leukocytosis.  CMP with normal electrolytes and kidney function.  Troponin is negative.  CBG is normal.  Repeat EKG with NSR, no  arrhythmia or ischemic changes.   CTA with no evidence of PE, pneumothorax or other pulmonary abnormality.  contacted radiology who reviewed imaging and confirmeds anterior minimally displaced 6th rib fracture. No other rib fx identified.  3:45 pm re-eval pt. Hr 80s on monitor. satting at 100% on RA. BP  120s systolic. Appears much more confortable. No diaphoresis. States he is still in pain and needs more pain medications. Is also requesting something to drink. Will order repeat dosing of pain meds, small fluid bolus and re-eval.   5:45 PM reevaluated patient.  He states that his pain is under much better control after the second dose of pain medications.  He has tolerated p.o.  I discussed the lab work, imaging results and plan for discharge with PCP follow-up in 1 week.  Advised incentive spirometry, Tylenol, ibuprofen and pain medications for symptoms.  Strict return precautions discussed for uncontrolled pain, shortness of breath, chest pain or fevers/chills/cough.  He voices understanding of the plan reasons to return to the ED.  All questions answered.  ED Discharge Orders         Ordered    oxyCODONE-acetaminophen (PERCOCET) 10-325 MG tablet  Every 8 hours PRN     01/01/18 1755           Karrie Meres, PA-C 01/01/18 1804    Alvira Monday, MD 01/04/18 [phone]

## 2018-01-01 NOTE — ED Triage Notes (Signed)
Pt reports that he stepped in hole in yard and fell on laptop bag last week. C/o left rib cage pain that is worse with movement and deep breathing.

## 2018-01-09 ENCOUNTER — Ambulatory Visit (INDEPENDENT_AMBULATORY_CARE_PROVIDER_SITE_OTHER): Payer: Self-pay | Admitting: Family Medicine

## 2018-01-09 ENCOUNTER — Other Ambulatory Visit: Payer: Self-pay | Admitting: Family Medicine

## 2018-01-09 ENCOUNTER — Other Ambulatory Visit: Payer: Self-pay

## 2018-01-09 ENCOUNTER — Encounter: Payer: Self-pay | Admitting: Family Medicine

## 2018-01-09 VITALS — BP 120/80 | HR 75 | Temp 98.2°F | Wt 307.7 lb

## 2018-01-09 DIAGNOSIS — S2232XD Fracture of one rib, left side, subsequent encounter for fracture with routine healing: Secondary | ICD-10-CM

## 2018-01-09 DIAGNOSIS — I8392 Asymptomatic varicose veins of left lower extremity: Secondary | ICD-10-CM

## 2018-01-09 DIAGNOSIS — I10 Essential (primary) hypertension: Secondary | ICD-10-CM

## 2018-01-09 DIAGNOSIS — Z23 Encounter for immunization: Secondary | ICD-10-CM

## 2018-01-09 MED ORDER — LISINOPRIL-HYDROCHLOROTHIAZIDE 20-12.5 MG PO TABS: 1.0000 | ORAL_TABLET | Freq: Every day | ORAL | 11 refills | Status: DC

## 2018-01-09 NOTE — Patient Instructions (Signed)
Rib Fracture ° °A rib fracture is a break or crack in one of the bones of the ribs. The ribs are a group of long, curved bones that wrap around your chest and attach to your spine. They protect your lungs and other organs in the chest cavity. A broken or cracked rib is often painful, but most do not cause other problems. Most rib fractures heal on their own over time. However, rib fractures can be more serious if multiple ribs are broken or if broken ribs move out of place and push against other structures. °What are the causes? °· A direct blow to the chest. For example, this could happen during contact sports, a car accident, or a fall against a hard object. °· Repetitive movements with high force, such as pitching a baseball or having severe coughing spells. °What are the signs or symptoms? °· Pain when you breathe in or cough. °· Pain when someone presses on the injured area. °How is this diagnosed? °Your caregiver will perform a physical exam. Various imaging tests may be ordered to confirm the diagnosis and to look for related injuries. These tests may include a chest X-ray, computed tomography (CT), magnetic resonance imaging (MRI), or a bone scan. °How is this treated? °Rib fractures usually heal on their own in 1-3 months. The longer healing period is often associated with a continued cough or other aggravating activities. During the healing period, pain control is very important. Medication is usually given to control pain. Hospitalization or surgery may be needed for more severe injuries, such as those in which multiple ribs are broken or the ribs have moved out of place. °Follow these instructions at home: °· Avoid strenuous activity and any activities or movements that cause pain. Be careful during activities and avoid bumping the injured rib. °· Gradually increase activity as directed by your caregiver. °· Only take over-the-counter or prescription medications as directed by your caregiver. Do not take  other medications without asking your caregiver first. °· Apply ice to the injured area for the first 1-2 days after you have been treated or as directed by your caregiver. Applying ice helps to reduce inflammation and pain. °? Put ice in a plastic bag. °? Place a towel between your skin and the bag. °? Leave the ice on for 15-20 minutes at a time, every 2 hours while you are awake. °· Perform deep breathing as directed by your caregiver. This will help prevent pneumonia, which is a common complication of a broken rib. Your caregiver may instruct you to: °? Take deep breaths several times a day. °? Try to cough several times a day, holding a pillow against the injured area. °? Use a device called an incentive spirometer to practice deep breathing several times a day. °· Drink enough fluids to keep your urine clear or pale yellow. This will help you avoid constipation. °· Do not wear a rib belt or binder. These restrict breathing, which can lead to pneumonia. °Get help right away if: °· You have a fever. °· You have difficulty breathing or shortness of breath. °· You develop a continual cough, or you cough up thick or bloody sputum. °· You feel sick to your stomach (nausea), throw up (vomit), or have abdominal pain. °· You have worsening pain not controlled with medications. °This information is not intended to replace advice given to you by your health care provider. Make sure you discuss any questions you have with your health care provider. °Document Released: 02/14/2005   Document Revised: 07/23/2015 Document Reviewed: 04/18/2012 °Elsevier Interactive Patient Education © 2018 [employer] ° °

## 2018-01-09 NOTE — Progress Notes (Signed)
Subjective:     Patient ID: Joseph Valenzuela, male   DOB: 1975/04/09, 42 y.o.   MRN: [id number]  HPI Patient here for the following issues  Recent ER visit.  He was walking through his brother's yard and there was a hole in the yard he tripped and was carrying his laptop and fell basically into the corner laptop into his chest wall.  No puncture of skin.  He had chest wall pain.  He went 1 week later to the ER on 01/01/2018.  X-rays revealed minimally displaced left sixth rib fracture.  No pneumothorax.  CT angiogram revealed no blood clots was prescribed 12 Percocets but is not taking any.  He is trying to manage without pain medication.  Is taking some Tylenol.  Was given incentive spirometry with he is using regularly  Hypertension treated with lisinopril HCTZ.  Blood pressure stable.  He is lost about 25 pounds due to eating better over the past few months.  He does have some varicose veins in both legs.  His mother had severe varicose veins.  These are mostly nonpainful.  He stands on his feet frequently with work  Past Medical History:  Diagnosis Date  . Alcoholism (HCC)   . Anxiety   . Asthma   . Back pain, chronic   . Barrett esophagus   . Benign positional vertigo   . Candidiasis of mouth   . Degenerative spinal arthritis   . Depression   . Esophageal reflux   . Fatty liver   . Herniated disc   . Hiatal hernia   . Hypertension   . Irritable bowel syndrome   . Obesity   . Spleen enlarged   . Suicidal ideations   . Vitamin B12 deficiency    Past Surgical History:  Procedure Laterality Date  . TYMPANOSTOMY TUBE PLACEMENT    . UPPER GASTROINTESTINAL ENDOSCOPY      reports that he has never smoked. He has never used smokeless tobacco. He reports that he drinks alcohol. He reports that he does not use drugs. family history includes Alcohol abuse in his father; Brain cancer in his maternal grandfather; Depression in his mother; Diabetes in his maternal aunt and mother;  Hypertension in his brother; Stomach cancer in his maternal grandmother. No Known Allergies   Review of Systems  Constitutional: Negative for chills, fatigue and fever.  Eyes: Negative for visual disturbance.  Respiratory: Negative for cough, chest tightness and shortness of breath.   Cardiovascular: Negative for palpitations and leg swelling.  Neurological: Negative for dizziness, syncope, weakness, light-headedness and headaches.       Objective:   Physical Exam  Constitutional: He is oriented to person, place, and time. He appears well-developed and well-nourished.  HENT:  Right Ear: External ear normal.  Left Ear: External ear normal.  Mouth/Throat: Oropharynx is clear and moist.  Eyes: Pupils are equal, round, and reactive to light.  Neck: Neck supple. No thyromegaly present.  Cardiovascular: Normal rate and regular rhythm.  Pulmonary/Chest: Effort normal and breath sounds normal. No respiratory distress. He has no wheezes. He has no rales.  Tender to palpation left chest wall around the sixth rib region  Musculoskeletal: He exhibits no edema.  Prominent varicose veins lower extremities bilaterally.  Nontender.  Neurological: He is alert and oriented to person, place, and time.       Assessment:     #1 left sixth rib fracture doing fairly well.  He is coping fairly well without pain medication.  Continue  incentive spirometer use  #2 hypertension stable and at goal.  Should be improving with his weight loss  #3 varicosities lower extremities    Plan:     -Follow-up promptly for any fever, cough, dyspnea -Refilled lisinopril HCTZ. -Continue weight loss efforts -Recommend knee-high support garments for his varicose veins  Kristian CoveyBruce W Niyonna Betsill MD [facility]

## 2018-01-09 NOTE — Addendum Note (Signed)
Addended by: Faustino Congress L on: 01/09/2018 10:16 AM   Modules accepted: Orders

## 2018-01-16 ENCOUNTER — Other Ambulatory Visit: Payer: Self-pay | Admitting: Family Medicine

## 2018-01-16 NOTE — Telephone Encounter (Signed)
Copied from CRM [phone]. Topic: Quick Communication - Rx Refill/Question >> Jan 16, 2018  8:34 AM Baldo DaubAlexander, Amber L wrote: Medication: clonazePAM (KLONOPIN) 1 MG tablet  Has the patient contacted their pharmacy? No - controlled substance (Agent: If no, request that the patient contact the pharmacy for the refill.) (Agent: If yes, when and what did the pharmacy advise?)  Preferred Pharmacy (with phone number or street name): [id number] - [place], [place] - [address] AT [address] [phone] (Phone) [phone] (Fax)  Agent: Please be advised that RX refills may take up to 3 business days. We ask that you follow-up with your pharmacy.

## 2018-01-17 MED ORDER — CLONAZEPAM 1 MG PO TABS: ORAL_TABLET | ORAL | 3 refills | Status: DC

## 2018-01-17 NOTE — Telephone Encounter (Signed)
Requested medication (s) are due for refill today: yes  Requested medication (s) are on the active medication list: yes    Last refill: 09/19/17  #90  3 refills  Future visit scheduled no  Notes to clinic:not delegated  Requested Prescriptions  Pending Prescriptions Disp Refills   clonazePAM (KLONOPIN) 1 MG tablet 90 tablet 3    Sig: TAKE 1 TABLET BY MOUTH 3 TIMES A DAY AS NEEDED FOR ANXIETY.  May refill on or after 09-22-17     Not Delegated - Psychiatry:  Anxiolytics/Hypnotics Failed - 01/17/2018  7:38 AM      Failed - This refill cannot be delegated      Failed - Urine Drug Screen completed in last 360 days.      Passed - Valid encounter within last 6 months    Recent Outpatient Visits          1 week ago Closed fracture of one rib of left side with routine healing, subsequent [facility], Elberta FortisBruce W, MD   3 months ago Acute bilateral low back pain without sciatica   [facility], Elberta FortisBruce W, MD   4 months ago Dietitianituational anxiety   [facility]te, Elberta FortisBruce W, MD   10 months ago Hypertension, unspecified type   [facility] Burchette, Elberta FortisBruce W, MD   1 year ago Anxiety Art gallery managerstate   [facility] Burchette, Elberta FortisBruce W, MD

## 2018-01-17 NOTE — Telephone Encounter (Signed)
Message routed to PCP CMA for refills. 

## 2018-01-17 NOTE — Telephone Encounter (Signed)
Please see message with pending Rx.

## 2018-01-19 ENCOUNTER — Other Ambulatory Visit: Payer: Self-pay | Admitting: Family Medicine

## 2018-01-19 NOTE — Telephone Encounter (Signed)
Refill once 

## 2018-01-19 NOTE — Telephone Encounter (Signed)
Rx done. 

## 2018-01-19 NOTE — Telephone Encounter (Signed)
Last Rx given on 8/2 for #30 with 1 ref

## 2018-02-26 ENCOUNTER — Encounter (HOSPITAL_COMMUNITY): Payer: Self-pay | Admitting: Emergency Medicine

## 2018-02-26 ENCOUNTER — Emergency Department (HOSPITAL_COMMUNITY)
Admission: EM | Admit: 2018-02-26 | Discharge: 2018-02-26 | Disposition: A | Discharge status: Home / Self Care | Payer: Medicaid Other | Attending: Emergency Medicine | Admitting: Emergency Medicine

## 2018-02-26 ENCOUNTER — Other Ambulatory Visit: Payer: Self-pay

## 2018-02-26 DIAGNOSIS — Z79899 Other long term (current) drug therapy: Secondary | ICD-10-CM | POA: Insufficient documentation

## 2018-02-26 DIAGNOSIS — F322 Major depressive disorder, single episode, severe without psychotic features: Secondary | ICD-10-CM | POA: Insufficient documentation

## 2018-02-26 DIAGNOSIS — F1010 Alcohol abuse, uncomplicated: Secondary | ICD-10-CM | POA: Diagnosis present

## 2018-02-26 DIAGNOSIS — J45909 Unspecified asthma, uncomplicated: Secondary | ICD-10-CM | POA: Insufficient documentation

## 2018-02-26 DIAGNOSIS — R45851 Suicidal ideations: Secondary | ICD-10-CM | POA: Insufficient documentation

## 2018-02-26 DIAGNOSIS — F329 Major depressive disorder, single episode, unspecified: Secondary | ICD-10-CM

## 2018-02-26 DIAGNOSIS — I10 Essential (primary) hypertension: Secondary | ICD-10-CM | POA: Insufficient documentation

## 2018-02-26 DIAGNOSIS — F32A Depression, unspecified: Secondary | ICD-10-CM

## 2018-02-26 LAB — COMPREHENSIVE METABOLIC PANEL
ALK PHOS: 100 U/L (ref 38–126)
ALT: 42 U/L (ref 0–44)
AST: 79 U/L — AB (ref 15–41)
Albumin: 4.5 g/dL (ref 3.5–5.0)
Anion gap: 13 (ref 5–15)
BUN: 13 mg/dL (ref 6–20)
CALCIUM: 9.3 mg/dL (ref 8.9–10.3)
CO2: 25 mmol/L (ref 22–32)
CREATININE: 0.8 mg/dL (ref 0.61–1.24)
Chloride: 100 mmol/L (ref 98–111)
Glucose, Bld: 109 mg/dL — ABNORMAL HIGH (ref 70–99)
Potassium: 4.2 mmol/L (ref 3.5–5.1)
Sodium: 138 mmol/L (ref 135–145)
Total Bilirubin: 1 mg/dL (ref 0.3–1.2)
Total Protein: 8.2 g/dL — ABNORMAL HIGH (ref 6.5–8.1)

## 2018-02-26 LAB — CBC
HCT: 43.1 % (ref 39.0–52.0)
Hemoglobin: 14.2 g/dL (ref 13.0–17.0)
MCH: 29.4 pg (ref 26.0–34.0)
MCHC: 32.9 g/dL (ref 30.0–36.0)
MCV: 89.2 fL (ref 80.0–100.0)
NRBC: 0 % (ref 0.0–0.2)
Platelets: 222 10*3/uL (ref 150–400)
RBC: 4.83 MIL/uL (ref 4.22–5.81)
RDW: 15.9 % — AB (ref 11.5–15.5)
WBC: 8.5 10*3/uL (ref 4.0–10.5)

## 2018-02-26 LAB — RAPID URINE DRUG SCREEN, HOSP PERFORMED
Amphetamines: NOT DETECTED
BENZODIAZEPINES: NOT DETECTED
Barbiturates: NOT DETECTED
Cocaine: NOT DETECTED
Opiates: NOT DETECTED
Tetrahydrocannabinol: NOT DETECTED

## 2018-02-26 LAB — ACETAMINOPHEN LEVEL: Acetaminophen (Tylenol), Serum: <10 ug/mL — ABNORMAL LOW (ref 10–30)

## 2018-02-26 LAB — ETHANOL: ALCOHOL ETHYL (B): 307 mg/dL — AB (ref <10)

## 2018-02-26 LAB — SALICYLATE LEVEL: Salicylate Lvl: <7 mg/dL (ref 2.8–30.0)

## 2018-02-26 MED ORDER — VITAMIN B-1 100 MG PO TABS
100.0000 mg | ORAL_TABLET | Freq: Every day | ORAL | Status: DC
  Administered 2018-02-26: 100 mg via ORAL
  Filled 2018-02-26: qty 1

## 2018-02-26 MED ORDER — LORAZEPAM 2 MG/ML IJ SOLN: 0.0000 mg | Freq: Four times a day (QID) | INTRAMUSCULAR | Status: DC

## 2018-02-26 MED ORDER — THIAMINE HCL 100 MG/ML IJ SOLN: 100.0000 mg | Freq: Every day | INTRAMUSCULAR | Status: DC

## 2018-02-26 MED ORDER — LORAZEPAM 1 MG PO TABS: 0.0000 mg | ORAL_TABLET | Freq: Two times a day (BID) | ORAL | Status: DC

## 2018-02-26 MED ORDER — LORAZEPAM 1 MG PO TABS: 0.0000 mg | ORAL_TABLET | Freq: Four times a day (QID) | ORAL | Status: DC

## 2018-02-26 MED ORDER — LORAZEPAM 2 MG/ML IJ SOLN: 0.0000 mg | Freq: Two times a day (BID) | INTRAMUSCULAR | Status: DC
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
## 2018-02-26 NOTE — ED Triage Notes (Signed)
Patient states he was wanting to cut himself so he could die. Friend (susan) brought him in.

## 2018-02-26 NOTE — ED Notes (Signed)
Pt presents with passive SI, no plan  Pt reports his wife left him and moved in with a family friend.  Denies HI and AVH.  A&O x 3, no distress noted, calm & cooperative, pt intoxicated.  Monitoring for safety, Q 15 min checks in effect.

## 2018-02-26 NOTE — Patient Outreach (Signed)
ED Peer Support Specialist Patient Intake (Complete at intake & 30-60 Day Follow-up)  Name: Joseph Valenzuela  MRN: [id number]  Age: 42 y.o.   Date of Admission: 02/26/2018  Intake: Initial Comments:      Primary Reason Admitted: Alcohol use Disorder  Lab values: Alcohol/ETOH: Positive Positive UDS? No Amphetamines: No Barbiturates: No Benzodiazepines: No Cocaine: No Opiates: No Cannabinoids: No  Demographic information: Gender: Male Ethnicity: White Marital Status: Separated Insurance Status: Uninsured/Self-pay Ecologist (Work Neurosurgeon, Physicist, medical, etc.: No Lives with: Sibling Living situation: House/Apartment  Reported Patient History: Patient reported health conditions: Depression Patient aware of HIV and hepatitis status: No  In past year, has patient visited ED for any reason? Yes  Number of ED visits: 5  Reason(s) for visit: Various reason   In past year, has patient been hospitalized for any reason? Yes  Number of hospitalizations: 1  Reason(s) for hospitalization: Broken Ribs  In past year, has patient been arrested? No  Number of arrests:    Reason(s) for arrest:    In past year, has patient been incarcerated? No  Number of incarcerations:    Reason(s) for incarceration:    In past year, has patient received medication-assisted treatment? No  In past year, patient received the following treatments: Individual therapy  In past year, has patient received any harm reduction services? No  Did this include any of the following?    In past year, has patient received care from a mental health provider for diagnosis other than SUD? Yes  In past year, is this first time patient has overdosed? No  Number of past overdoses:    In past year, is this first time patient has been hospitalized for an overdose? No  Number of hospitalizations for overdose(s):    Is patient currently receiving treatment for a mental health  diagnosis? Yes(Received Treatment only one time. )  Patient reports experiencing difficulty participating in SUD treatment: No    Most important reason(s) for this difficulty?    Has patient received prior services for treatment? No  In past, patient has received services from following agencies:    Plan of Care:  Suggested follow up at these agencies/treatment centers: Other (comment)  Other information: [facility]PSS met with Pt and was able to process with Pt about what services Pt was seeking and what brought Pt in. Pt addressed the fact that he was seeking counseling and was not gaining anything from the services. CPSS discussed the importance of Pt thinking about getting some help. CPSS John processed with Pt about the outpatient services an other resources that Pt may gain some services. CPSS made Pt aware that Pt can contract [facility]PSS in the community and [facility]PSS can meet an process with Pt to help support Pt in the community.     Aaron Edelman Shawndra Clute, CPSS  02/26/2018 11:53 AM

## 2018-02-26 NOTE — ED Notes (Signed)
Bed: WTR5 Expected date:  Expected time:  Means of arrival:  Comments: 

## 2018-02-26 NOTE — Discharge Instructions (Signed)
For your behavioral health needs you are advised to follow up with [facility].  New patients are seen at their walk-in clinic.  Walk-in hours are Monday - Friday from 8:00 am - 12:00 pm, and from 1:00 pm - 3:00 pm.  Walk-in patients are seen on a first come, first served basis, so try to arrive as early as possible for the best chance of being seen the same day.  There is an initial fee of $22.50: ° °     [facility] °     [address] °     [place], [place] [address] °     [phone] °

## 2018-02-26 NOTE — ED Notes (Signed)
Patient friend Darl Pikessusan took patient money 143.00. [phone]

## 2018-02-26 NOTE — BH Assessment (Signed)
[facility] Assessment Progress Note  Per Dr Fredda HammedAktar, this pt does not require psychiatric hospitalization at this time.  [facility]  This has been included in pt's discharge instructions.  Pt would also benefit from seeing Peer Support Specialists; they will be asked to speak to pt.  Pt's nurse, Kendal Hymendie, has been notified.  Doylene Canninghomas Zaivion Kundrat, MA Triage Specialist [phone]

## 2018-02-26 NOTE — [facility] Suicide Risk Assessment (Signed)
Suicide Risk Assessment  Discharge Assessment   [facility]   Principal Problem: Alcohol abuse Discharge Diagnoses: Principal Problem:   Alcohol abuse   Total Time spent with patient: 45 minutes  Musculoskeletal: Strength & Muscle Tone: within normal limits Gait & Station: normal Patient leans: N/A  Psychiatric Specialty Exam:   Blood pressure 122/76, pulse 96, temperature 98.7 F (37.1 C), temperature source Oral, resp. rate 18, height 5\' 11"  (1.803 m), weight 127.9 kg, SpO2 95 %.Body mass index is 39.33 kg/m.  General Appearance: Casual  Eye Contact::  Good  Speech:  Normal Rate409  Volume:  Normal  Mood:  Depressed, mild  Affect:  Congruent  Thought Process:  Coherent and Descriptions of Associations: Intact  Orientation:  Full (Time, Place, and Person)  Thought Content:  WDL and Logical  Suicidal Thoughts:  No  Homicidal Thoughts:  No  Memory:  Immediate;   Good Recent;   Good Remote;   Good  Judgement:  Fair  Insight:  Fair  Psychomotor Activity:  Normal  Concentration:  Good  Recall:  Good  Fund of Knowledge:Good  Language: Good  Akathisia:  No  Handed:  Right  AIMS (if indicated):     Assets:  Housing Leisure Time Physical Health Resilience  Sleep:     Cognition: WNL  ADL's:  Intact   Mental Status Per Nursing Assessment::   On Admission:   42 yo male who presented to the ED with alcohol intoxication.  This morning he is clear and coherent with no suicidal/homicidal ideations,hallucinations, or withdrawal symptoms.  Reports he was successful with AA for ten years.  Drank some "wine" on Christmas and finished it off yesterday.  Wants to meet with Peer Support and discharge.  Stable for discharge.  Demographic Factors:  Male and Caucasian  Loss Factors: NA  Historical Factors: NA  Risk Reduction Factors:   Sense of responsibility to family, Employed and Positive social support  Continued Clinical Symptoms:   Depression, mild  Cognitive Features That Contribute To Risk:  None    Suicide Risk:  Minimal: No identifiable suicidal ideation.  Patients presenting with no risk factors but with morbid ruminations; may be classified as minimal risk based on the severity of the depressive symptoms    Plan Of Care/Follow-up recommendations:  Activity:  as tolerated Diet:  heart healthy diet  LORD, JAMISON, NP 02/26/2018, 11:37 AM

## 2018-02-26 NOTE — ED Provider Notes (Signed)
[facility] Note   CSN: [id number] Arrival date & time: 02/26/18  0300     History   Chief Complaint Chief Complaint  Patient presents with  . Suicidal    HPI Joseph Valenzuela is a 42 y.o. male.  Joseph Valenzuela is a 42 y.o. male with a history of hypertension, IBS, GERD, alcoholism, back pain who presents to the emergency department for evaluation of suicidal ideations.  He reports that the holidays were very stressful for him and he has had worsening depression, his wife has left him and his family has moved away and he has no desire to live anymore.  This evening he reached out to a friend after having thoughts of cutting himself to end his life.  He does endorse drinking alcohol tonight reports he had 2 beers and several shots of moonshine prior to calling his friend.  He denies any HI or AVH.  He reports no prior psychiatric hospitalizations aside from once many years ago when he was younger, uses Klonopin intermittently for anxiety but denies any other psychiatric medications.  Denies any focal medical problems this evening, denies any fevers or recent illnesses, no chest pain, shortness of breath, abdominal pain, nausea, vomiting, headaches or vision changes.     Past Medical History:  Diagnosis Date  . Alcoholism (HCC)   . Anxiety   . Asthma   . Back pain, chronic   . Barrett esophagus   . Benign positional vertigo   . Candidiasis of mouth   . Degenerative spinal arthritis   . Depression   . Esophageal reflux   . Fatty liver   . Herniated disc   . Hiatal hernia   . Hypertension   . Irritable bowel syndrome   . Obesity   . Spleen enlarged   . Suicidal ideations   . Vitamin B12 deficiency     Patient Active Problem List   Diagnosis Date Noted  . Varicose veins of left lower extremity 01/09/2018  . Spleen enlargement 06/17/2013  . Abdominal pain, unspecified site 06/17/2013  . Erectile dysfunction 03/26/2013  .  Severe obesity (BMI >= 40) (HCC) 03/26/2013  . Morbid obesity (HCC) 10/01/2010  . GERD (gastroesophageal reflux disease) 10/01/2010  . Anxiety and depression 10/01/2010  . Hypertension 10/01/2010  . BACK PAIN, CHRONIC 09/29/2009  . Vitamin B 12 deficiency 09/16/2009  . CHEST PAIN UNSPECIFIED 09/16/2009  . EDEMA 09/15/2009  . HERNIATED DISC 12/08/2008  . DISC DISEASE, LUMBAR 08/16/2008  . ASTHMA 03/11/2008  . History of alcohol abuse 03/06/2008  . EXTRINSIC ASTHMA, UNSPECIFIED 03/01/2008  . DEPRESSION 09/18/2007  . Obesity 07/31/2007  . GERD 07/03/2007  . IRRITABLE BOWEL SYNDROME 07/03/2007  . Fatty liver 07/03/2007  . ABDOMINAL PAIN-EPIGASTRIC 07/03/2007  . BENIGN POSITIONAL VERTIGO 05/21/2007  . Anxiety state 04/26/2007  . Essential hypertension 04/26/2007    Past Surgical History:  Procedure Laterality Date  . TYMPANOSTOMY TUBE PLACEMENT    . UPPER GASTROINTESTINAL ENDOSCOPY          Home Medications    Prior to Admission medications   Medication Sig Start Date End Date Taking? Authorizing Provider  clonazePAM (KLONOPIN) 1 MG tablet TAKE 1 TABLET BY MOUTH 3 TIMES A DAY AS NEEDED FOR ANXIETY.  May refill on or after 09-22-17 Patient taking differently: Take 1 mg by mouth 3 (three) times daily as needed for anxiety.  01/17/18  Yes Burchette, Elberta Fortis, MD  lisinopril-hydrochlorothiazide (PRINZIDE,ZESTORETIC) 20-12.5 MG tablet TAKE 1 TABLET  BY MOUTH EVERY DAY Patient taking differently: Take 1 tablet by mouth daily.  01/09/18  Yes Burchette, Elberta FortisBruce W, MD  methocarbamol (ROBAXIN) 750 MG tablet TAKE 1 TABLET (750 MG TOTAL) BY MOUTH EVERY 8 (EIGHT) HOURS AS NEEDED FOR MUSCLE SPASMS. 01/19/18  Yes Burchette, Elberta FortisBruce W, MD  omeprazole (PRILOSEC) 20 MG capsule Take 20 mg by mouth daily.   Yes [provider]  lisinopril-hydrochlorothiazide (PRINZIDE,ZESTORETIC) 20-12.5 MG tablet Take 1 tablet by mouth daily. Patient not taking: Reported on 02/26/2018 01/09/18   Kristian CoveyBurchette,  Bruce W, MD  oxyCODONE-acetaminophen (PERCOCET) 10-325 MG tablet Take 1 tablet by mouth every 8 (eight) hours as needed for pain. Patient not taking: Reported on 01/09/2018 01/01/18   Couture, Cortni S, PA-C    Family History Family History  Problem Relation Age of Onset  . Alcohol abuse Father   . Depression Mother   . Diabetes Mother   . Hypertension Brother   . Diabetes Maternal Aunt   . Stomach cancer Maternal Grandmother        great  . Brain cancer Maternal Grandfather   . Colon cancer Neg Hx   . Esophageal cancer Neg Hx     Social History Social History   Tobacco Use  . Smoking status: Never Smoker  . Smokeless tobacco: Never Used  Substance Use Topics  . Alcohol use: Yes    Alcohol/week: 0.0 standard drinks    Comment: drink twice a week per pt.  . Drug use: No    Types: Marijuana    Comment: previous hx     Allergies   Patient has no known allergies.   Review of Systems Review of Systems  Constitutional: Negative for chills and fever.  HENT: Negative.   Eyes: Negative for visual disturbance.  Respiratory: Negative for cough and shortness of breath.   Cardiovascular: Negative for chest pain.  Gastrointestinal: Negative for abdominal pain, nausea and vomiting.  Genitourinary: Negative for dysuria and frequency.  Musculoskeletal: Negative for arthralgias and myalgias.  Skin: Negative for color change and rash.  Neurological: Negative for dizziness, syncope, light-headedness and headaches.  Psychiatric/Behavioral: Positive for dysphoric mood and suicidal ideas.     Physical Exam Updated Vital Signs BP 122/76 (BP Location: Right Arm)   Pulse 96   Temp 98.7 F (37.1 C) (Oral)   Resp 18   Ht 5\' 11"  (1.803 m)   Wt 127.9 kg   SpO2 95%   BMI 39.33 kg/m   Physical Exam Vitals signs and nursing note reviewed.  Constitutional:      General: He is not in acute distress.    Appearance: He is well-developed. He is not diaphoretic.  HENT:     Head:  Normocephalic and atraumatic.  Eyes:     General:        Right eye: No discharge.        Left eye: No discharge.     Pupils: Pupils are equal, round, and reactive to light.  Neck:     Musculoskeletal: Neck supple.  Cardiovascular:     Rate and Rhythm: Normal rate and regular rhythm.     Pulses: Normal pulses.     Heart sounds: Normal heart sounds.  Pulmonary:     Effort: Pulmonary effort is normal. No respiratory distress.     Breath sounds: Normal breath sounds. No wheezing or rales.     Comments: Respirations equal and unlabored, patient able to speak in full sentences, lungs clear to auscultation bilaterally Abdominal:  General: Bowel sounds are normal. There is no distension.     Palpations: Abdomen is soft. There is no mass.     Tenderness: There is no abdominal tenderness. There is no guarding.     Comments: Abdomen soft, nondistended, nontender to palpation in all quadrants without guarding or peritoneal signs  Musculoskeletal:        General: No deformity.  Skin:    General: Skin is warm and dry.     Capillary Refill: Capillary refill takes less than 2 seconds.  Neurological:     Mental Status: He is alert.     Coordination: Coordination normal.     Comments: Speech is clear, able to follow commands CN III-XII grossly intact Moves extremities without ataxia, coordination intact   Psychiatric:        Attention and Perception: Attention normal. He perceives auditory hallucinations. He does not perceive visual hallucinations.        Mood and Affect: Mood is depressed.        Speech: Speech normal.        Behavior: Behavior normal. Behavior is cooperative.        Thought Content: Thought content includes suicidal ideation. Thought content does not include homicidal ideation. Thought content includes suicidal plan. Thought content does not include homicidal plan.      ED Treatments / Results  Labs (all labs ordered are listed, but only abnormal results are  displayed) Labs Reviewed  COMPREHENSIVE METABOLIC PANEL - Abnormal; Notable for the following components:      Result Value   Glucose, Bld 109 (*)    Total Protein 8.2 (*)    AST 79 (*)    All other components within normal limits  ETHANOL - Abnormal; Notable for the following components:   Alcohol, Ethyl (B) 307 (*)    All other components within normal limits  ACETAMINOPHEN LEVEL - Abnormal; Notable for the following components:   Acetaminophen (Tylenol), Serum <10 (*)    All other components within normal limits  CBC - Abnormal; Notable for the following components:   RDW 15.9 (*)    All other components within normal limits  SALICYLATE LEVEL  RAPID URINE DRUG SCREEN, HOSP PERFORMED    EKG None  Radiology No results found.  Procedures Procedures (including critical care time)  Medications Ordered in ED Medications  LORazepam (ATIVAN) injection 0-4 mg (has no administration in time range)    Or  LORazepam (ATIVAN) tablet 0-4 mg (has no administration in time range)  LORazepam (ATIVAN) injection 0-4 mg (has no administration in time range)    Or  LORazepam (ATIVAN) tablet 0-4 mg (has no administration in time range)  thiamine (VITAMIN B-1) tablet 100 mg (has no administration in time range)    Or  thiamine (B-1) injection 100 mg (has no administration in time range)     Initial Impression / Assessment and Plan / ED Course  I have reviewed the triage vital signs and the nursing notes.  Pertinent labs & imaging results that were available during my care of the patient were reviewed by me and considered in my medical decision making (see chart for details).  Patient presents for evaluation of suicidal ideations.  These of been worsening over the past 3 days, reports triggers for emotional stress over the holidays.  Denies HI or AVH.  On arrival patient is intoxicated and endorses drinking 2 beers and several shots of moonshine prior to arrival.  Accompanied by friend  who brought him  in for evaluation after he endorsed thoughts of harming himself.  He is not on any antidepressants at this time, does use Klonopin intermittently for anxiety.  Reports he does not drink daily and denies any other substance abuse.  Feel patient will benefit from psychiatric evaluation and intervention.  He denies any other focal medical complaints and vital stable on arrival.  No findings on exam.  Will get medical screening labs and place TTS consult.  Labs show an ethanol level of 307, but are otherwise reassuring, no leukocytosis and normal hemoglobin, no acute electrolyte derangements, normal renal and liver function, negative acetaminophen and salicylate levels and negative UDS.  On my evaluation patient has clinically sobered and is now medically cleared for psychiatric evaluation.  Per TTS they recommend reevaluation in the morning after patient has had more time to sober before determining appropriate disposition.  Patient placed under ED psych hold.   Final Clinical Impressions(s) / ED Diagnoses   Final diagnoses:  Suicidal ideation  Depression, unspecified depression type    ED Discharge Orders    None       Dartha Lodge, [place] 02/26/18 1610    Paula Libra, MD 02/26/18 2251

## 2018-02-26 NOTE — ED Notes (Signed)
Pt discharged safely with resources.  Pt denies S/I.  All belongings were returned to pt.

## 2018-05-14 ENCOUNTER — Other Ambulatory Visit: Payer: Self-pay | Admitting: Family Medicine

## 2018-05-16 NOTE — Telephone Encounter (Signed)
Last OV 01/09/18, No future OV  Last filled 01/17/18, # 90  This medication is not currently on med list

## 2018-05-16 NOTE — Telephone Encounter (Signed)
It appears he was taken off this?  He had admission through psychiatry in December and we need to confirm if he is seeing psychiatry at this time.  Would not refill until we can clarify.

## 2018-05-17 NOTE — Telephone Encounter (Signed)
I called the pt, informed him of the message below and a 2-week supply was sent to his pharmacy.  Patient stated he needs to come in for an appt for an appt tomorrow as he is starting a new job next week.  Appt scheduled for 3/20.

## 2018-05-17 NOTE — Telephone Encounter (Signed)
I called the pt and he stated he was not taken off of Clonazepam and has a few pills left for today only.  Stated he was admitted for one day only and is not seeing a psychiatrist.  Message sent to Dr Caryl Never.

## 2018-05-17 NOTE — Telephone Encounter (Signed)
Refill 2 weeks supply until we can get follow up.  I do not feel comfortable giving larger amount until follow up since recent psychiatry admission.

## 2018-05-18 ENCOUNTER — Other Ambulatory Visit: Payer: Self-pay

## 2018-05-18 ENCOUNTER — Encounter: Payer: Self-pay | Admitting: Family Medicine

## 2018-05-18 ENCOUNTER — Ambulatory Visit (INDEPENDENT_AMBULATORY_CARE_PROVIDER_SITE_OTHER): Payer: Self-pay | Admitting: Family Medicine

## 2018-05-18 VITALS — BP 120/78 | HR 102 | Temp 98.6°F | Ht 69.0 in | Wt 287.8 lb

## 2018-05-18 DIAGNOSIS — I10 Essential (primary) hypertension: Secondary | ICD-10-CM

## 2018-05-18 DIAGNOSIS — F411 Generalized anxiety disorder: Secondary | ICD-10-CM

## 2018-05-18 NOTE — Progress Notes (Signed)
Subjective:     Patient ID: Joseph Valenzuela, male   DOB: Sep 30, 1975, 43 y.o.   MRN: [id number]  HPI Patient is here for medical follow-up.  He had called yesterday requesting refills of Klonopin.  We had concerns because back December 30 he was in the ED with initial concerns over suicidal ideation and also he had had a large amount of alcohol.  He ended up not being admitted.  He has not had any psychiatry follow-up.  He has had particularly difficult year with break-up from his wife and also loss of job.  He currently is in process of moving from [place] to [place] to live with his aunt and uncle.  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  He denies any depression issues right now.  He has chronic anxiety has been on Klonopin for several years.  He states that he generally does not drink any alcohol.  He denies any illicit drug use.  His drug screen in the ED back in December was negative.  He has hypertension treated with lisinopril HCTZ.  No headaches or dizziness.  He has lost some weight over the past several months which he attributes to his efforts  Past Medical History:  Diagnosis Date  . Alcoholism (HCC)   . Anxiety   . Asthma   . Back pain, chronic   . Barrett esophagus   . Benign positional vertigo   . Candidiasis of mouth   . Degenerative spinal arthritis   . Depression   . Esophageal reflux   . Fatty liver   . Herniated disc   . Hiatal hernia   . Hypertension   . Irritable bowel syndrome   . Obesity   . Spleen enlarged   . Suicidal ideations   . Vitamin B12 deficiency    Past Surgical History:  Procedure Laterality Date  . TYMPANOSTOMY TUBE PLACEMENT    . UPPER GASTROINTESTINAL ENDOSCOPY      reports that he has never smoked. He has never used smokeless tobacco. He reports current alcohol use. He reports that he does not use drugs. family history includes Alcohol abuse in his father; Brain cancer in his maternal  grandfather; Depression in his mother; Diabetes in his maternal aunt and mother; Hypertension in his brother; Stomach cancer in his maternal grandmother. No Known Allergies   Review of Systems  Constitutional: Negative for chills and fatigue.  Eyes: Negative for visual disturbance.  Respiratory: Negative for cough, chest tightness and shortness of breath.   Cardiovascular: Negative for chest pain, palpitations and leg swelling.  Neurological: Negative for dizziness, syncope, weakness, light-headedness and headaches.       Objective:   Physical Exam Constitutional:      Appearance: He is well-developed.  HENT:     Right Ear: External ear normal.     Left Ear: External ear normal.  Eyes:     Pupils: Pupils are equal, round, and reactive to light.  Neck:     Musculoskeletal: Neck supple.     Thyroid: No thyromegaly.  Cardiovascular:     Rate and Rhythm: Normal rate and regular rhythm.  Pulmonary:     Effort: Pulmonary effort is normal. No respiratory distress.     Breath sounds: Normal breath sounds. No wheezing or rales.  Musculoskeletal:     Right lower leg: No edema.     Left lower leg: No edema.  Neurological:     Mental Status: He is  alert and oriented to person, place, and time.        Assessment:     #1 hypertension stable and at goal  #2 chronic anxiety    Plan:     -We expressed our concerns regarding risk of benzodiazepines and alcohol.  He states that he does not generally use any alcohol at all and he understands the risk of combined use.  He does not take any pain medications.  -Continue current blood pressure medication.  -Continue weight loss efforts  -We recommend 61-month follow-up  Kristian Covey MD [facility]

## 2018-05-29 ENCOUNTER — Other Ambulatory Visit: Payer: Self-pay | Admitting: Family Medicine

## 2018-05-29 NOTE — Telephone Encounter (Signed)
Patient stopped by the office.  He stated Clonazepam was last filled for 14 days until he came in the office.  He came in the office the very next day once he was told to come in.  Once he was here it was supposed to be filled for 30 days each time.  He tried to fill the prescription this morning and it came up as another 14 day supply authorization.  Patient needs a refill by close of business Friday.   Pharmacy:  [facility]

## 2018-05-29 NOTE — Telephone Encounter (Signed)
Please see attached message

## 2018-05-29 NOTE — Telephone Encounter (Signed)
Last OV 05/18/18, No future OV  Last filled 05/17/18, # 42 with 0 refills  This request has the updated signature, the script on 05/17/18 has the old date of 09/22/2017

## 2018-05-30 ENCOUNTER — Telehealth: Payer: Self-pay

## 2018-05-30 MED ORDER — CLONAZEPAM 1 MG PO TABS: ORAL_TABLET | ORAL | 5 refills | Status: DC

## 2018-05-30 NOTE — Telephone Encounter (Signed)
I want patient to start gradually scaling this back- particularly in view of more recent data showing concern for possible long term memory loss and possible association with increased dementia.  Would decrease to bid.  At follow up would discuss further reduction in dose .  I have sent in refills for 60/month and he should go ahead now with reducing to bid dosing.

## 2018-05-30 NOTE — Telephone Encounter (Signed)
Called patient and he has lost his job and he is really upset about the changes. He stated that he will try the new dosage and he was advised to call back and set up a WebEx if he had difficulty with the new dosage. He will need to discuss with Dr. Caryl Never. Patient verbalized an understanding.

## 2018-08-15 ENCOUNTER — Encounter (HOSPITAL_COMMUNITY): Payer: Self-pay | Admitting: Emergency Medicine

## 2018-08-15 ENCOUNTER — Emergency Department (HOSPITAL_COMMUNITY): Payer: Medicaid Other

## 2018-08-15 ENCOUNTER — Inpatient Hospital Stay (HOSPITAL_COMMUNITY)
Admission: EM | Admit: 2018-08-15 | Discharge: 2018-08-25 | DRG: 432 | Disposition: A | Discharge status: Hospice | Payer: Medicaid Other | Attending: Internal Medicine | Admitting: Internal Medicine

## 2018-08-15 ENCOUNTER — Other Ambulatory Visit: Payer: Self-pay

## 2018-08-15 DIAGNOSIS — K922 Gastrointestinal hemorrhage, unspecified: Secondary | ICD-10-CM

## 2018-08-15 DIAGNOSIS — R600 Localized edema: Secondary | ICD-10-CM | POA: Diagnosis present

## 2018-08-15 DIAGNOSIS — E872 Acidosis: Secondary | ICD-10-CM | POA: Diagnosis present

## 2018-08-15 DIAGNOSIS — Z66 Do not resuscitate: Secondary | ICD-10-CM | POA: Diagnosis not present

## 2018-08-15 DIAGNOSIS — I8511 Secondary esophageal varices with bleeding: Secondary | ICD-10-CM | POA: Diagnosis present

## 2018-08-15 DIAGNOSIS — F1010 Alcohol abuse, uncomplicated: Secondary | ICD-10-CM | POA: Diagnosis present

## 2018-08-15 DIAGNOSIS — M479 Spondylosis, unspecified: Secondary | ICD-10-CM | POA: Diagnosis present

## 2018-08-15 DIAGNOSIS — K7011 Alcoholic hepatitis with ascites: Secondary | ICD-10-CM

## 2018-08-15 DIAGNOSIS — E871 Hypo-osmolality and hyponatremia: Secondary | ICD-10-CM | POA: Diagnosis present

## 2018-08-15 DIAGNOSIS — Z833 Family history of diabetes mellitus: Secondary | ICD-10-CM

## 2018-08-15 DIAGNOSIS — E876 Hypokalemia: Secondary | ICD-10-CM | POA: Diagnosis not present

## 2018-08-15 DIAGNOSIS — E860 Dehydration: Secondary | ICD-10-CM

## 2018-08-15 DIAGNOSIS — E8770 Fluid overload, unspecified: Secondary | ICD-10-CM | POA: Diagnosis present

## 2018-08-15 DIAGNOSIS — N170 Acute kidney failure with tubular necrosis: Secondary | ICD-10-CM | POA: Diagnosis present

## 2018-08-15 DIAGNOSIS — R310 Gross hematuria: Secondary | ICD-10-CM | POA: Diagnosis present

## 2018-08-15 DIAGNOSIS — I8501 Esophageal varices with bleeding: Secondary | ICD-10-CM

## 2018-08-15 DIAGNOSIS — Z6841 Body Mass Index (BMI) 40.0 and over, adult: Secondary | ICD-10-CM

## 2018-08-15 DIAGNOSIS — K7200 Acute and subacute hepatic failure without coma: Secondary | ICD-10-CM

## 2018-08-15 DIAGNOSIS — E119 Type 2 diabetes mellitus without complications: Secondary | ICD-10-CM | POA: Diagnosis present

## 2018-08-15 DIAGNOSIS — Z515 Encounter for palliative care: Secondary | ICD-10-CM | POA: Diagnosis present

## 2018-08-15 DIAGNOSIS — F32A Depression, unspecified: Secondary | ICD-10-CM | POA: Diagnosis present

## 2018-08-15 DIAGNOSIS — J45909 Unspecified asthma, uncomplicated: Secondary | ICD-10-CM | POA: Diagnosis present

## 2018-08-15 DIAGNOSIS — Z8249 Family history of ischemic heart disease and other diseases of the circulatory system: Secondary | ICD-10-CM

## 2018-08-15 DIAGNOSIS — D61818 Other pancytopenia: Secondary | ICD-10-CM | POA: Diagnosis present

## 2018-08-15 DIAGNOSIS — K920 Hematemesis: Secondary | ICD-10-CM | POA: Diagnosis present

## 2018-08-15 DIAGNOSIS — Z818 Family history of other mental and behavioral disorders: Secondary | ICD-10-CM

## 2018-08-15 DIAGNOSIS — Z79899 Other long term (current) drug therapy: Secondary | ICD-10-CM

## 2018-08-15 DIAGNOSIS — M549 Dorsalgia, unspecified: Secondary | ICD-10-CM | POA: Diagnosis present

## 2018-08-15 DIAGNOSIS — K2270 Barrett's esophagus without dysplasia: Secondary | ICD-10-CM | POA: Diagnosis present

## 2018-08-15 DIAGNOSIS — D638 Anemia in other chronic diseases classified elsewhere: Secondary | ICD-10-CM | POA: Diagnosis present

## 2018-08-15 DIAGNOSIS — N179 Acute kidney failure, unspecified: Secondary | ICD-10-CM

## 2018-08-15 DIAGNOSIS — R109 Unspecified abdominal pain: Secondary | ICD-10-CM | POA: Diagnosis present

## 2018-08-15 DIAGNOSIS — B3781 Candidal esophagitis: Secondary | ICD-10-CM | POA: Diagnosis present

## 2018-08-15 DIAGNOSIS — R17 Unspecified jaundice: Secondary | ICD-10-CM

## 2018-08-15 DIAGNOSIS — F1020 Alcohol dependence, uncomplicated: Secondary | ICD-10-CM | POA: Diagnosis present

## 2018-08-15 DIAGNOSIS — R161 Splenomegaly, not elsewhere classified: Secondary | ICD-10-CM | POA: Diagnosis present

## 2018-08-15 DIAGNOSIS — K767 Hepatorenal syndrome: Secondary | ICD-10-CM | POA: Diagnosis not present

## 2018-08-15 DIAGNOSIS — D684 Acquired coagulation factor deficiency: Secondary | ICD-10-CM | POA: Diagnosis present

## 2018-08-15 DIAGNOSIS — E875 Hyperkalemia: Secondary | ICD-10-CM | POA: Diagnosis present

## 2018-08-15 DIAGNOSIS — F411 Generalized anxiety disorder: Secondary | ICD-10-CM | POA: Diagnosis present

## 2018-08-15 DIAGNOSIS — K3189 Other diseases of stomach and duodenum: Secondary | ICD-10-CM | POA: Diagnosis present

## 2018-08-15 DIAGNOSIS — K7031 Alcoholic cirrhosis of liver with ascites: Secondary | ICD-10-CM | POA: Diagnosis present

## 2018-08-15 DIAGNOSIS — Z1159 Encounter for screening for other viral diseases: Secondary | ICD-10-CM

## 2018-08-15 DIAGNOSIS — Z811 Family history of alcohol abuse and dependence: Secondary | ICD-10-CM

## 2018-08-15 DIAGNOSIS — Z7189 Other specified counseling: Secondary | ICD-10-CM

## 2018-08-15 DIAGNOSIS — R188 Other ascites: Secondary | ICD-10-CM

## 2018-08-15 DIAGNOSIS — K7040 Alcoholic hepatic failure without coma: Principal | ICD-10-CM | POA: Diagnosis present

## 2018-08-15 DIAGNOSIS — G8929 Other chronic pain: Secondary | ICD-10-CM | POA: Diagnosis present

## 2018-08-15 DIAGNOSIS — R7401 Elevation of levels of liver transaminase levels: Secondary | ICD-10-CM | POA: Diagnosis present

## 2018-08-15 DIAGNOSIS — F329 Major depressive disorder, single episode, unspecified: Secondary | ICD-10-CM | POA: Diagnosis present

## 2018-08-15 DIAGNOSIS — K219 Gastro-esophageal reflux disease without esophagitis: Secondary | ICD-10-CM | POA: Diagnosis present

## 2018-08-15 DIAGNOSIS — K766 Portal hypertension: Secondary | ICD-10-CM | POA: Diagnosis present

## 2018-08-15 DIAGNOSIS — I9581 Postprocedural hypotension: Secondary | ICD-10-CM | POA: Diagnosis not present

## 2018-08-15 DIAGNOSIS — D72829 Elevated white blood cell count, unspecified: Secondary | ICD-10-CM | POA: Diagnosis not present

## 2018-08-15 DIAGNOSIS — D62 Acute posthemorrhagic anemia: Secondary | ICD-10-CM

## 2018-08-15 DIAGNOSIS — I10 Essential (primary) hypertension: Secondary | ICD-10-CM | POA: Diagnosis present

## 2018-08-15 DIAGNOSIS — K589 Irritable bowel syndrome without diarrhea: Secondary | ICD-10-CM | POA: Diagnosis present

## 2018-08-15 DIAGNOSIS — I878 Other specified disorders of veins: Secondary | ICD-10-CM | POA: Diagnosis present

## 2018-08-15 HISTORY — DX: Other specified anxiety disorders: F41.8

## 2018-08-15 LAB — TYPE AND SCREEN
ABO/RH(D): A NEG
Antibody Screen: NEGATIVE

## 2018-08-15 LAB — CBC WITH DIFFERENTIAL/PLATELET
Abs Immature Granulocytes: 0.08 10*3/uL — ABNORMAL HIGH (ref 0.00–0.07)
Abs Immature Granulocytes: 0.1 10*3/uL — ABNORMAL HIGH (ref 0.00–0.07)
Basophils Absolute: 0 10*3/uL (ref 0.0–0.1)
Basophils Absolute: 0 10*3/uL (ref 0.0–0.1)
Basophils Relative: 0 %
Basophils Relative: 0 %
Eosinophils Absolute: 0.1 10*3/uL (ref 0.0–0.5)
Eosinophils Absolute: 0 10*3/uL (ref 0.0–0.5)
Eosinophils Relative: 1 %
Eosinophils Relative: 1 %
HCT: 30.4 % — ABNORMAL LOW (ref 39.0–52.0)
HCT: 28.7 % — ABNORMAL LOW (ref 39.0–52.0)
Hemoglobin: 10.8 g/dL — ABNORMAL LOW (ref 13.0–17.0)
Hemoglobin: 10.5 g/dL — ABNORMAL LOW (ref 13.0–17.0)
Immature Granulocytes: 1 %
Immature Granulocytes: 2 %
Lymphocytes Relative: 13 %
Lymphocytes Relative: 11 %
Lymphs Abs: 1.1 10*3/uL (ref 0.7–4.0)
Lymphs Abs: 0.7 10*3/uL (ref 0.7–4.0)
MCH: 31.5 pg (ref 26.0–34.0)
MCH: 32.1 pg (ref 26.0–34.0)
MCHC: 35.5 g/dL (ref 30.0–36.0)
MCHC: 36.6 g/dL — ABNORMAL HIGH (ref 30.0–36.0)
MCV: 88.6 fL (ref 80.0–100.0)
MCV: 87.8 fL (ref 80.0–100.0)
Monocytes Absolute: 0.9 10*3/uL (ref 0.1–1.0)
Monocytes Absolute: 0.6 10*3/uL (ref 0.1–1.0)
Monocytes Relative: 11 %
Monocytes Relative: 10 %
Neutro Abs: 6.1 10*3/uL (ref 1.7–7.7)
Neutro Abs: 4.9 10*3/uL (ref 1.7–7.7)
Neutrophils Relative %: 74 %
Neutrophils Relative %: 76 %
Platelets: 170 10*3/uL (ref 150–400)
Platelets: 133 10*3/uL — ABNORMAL LOW (ref 150–400)
RBC: 3.43 MIL/uL — ABNORMAL LOW (ref 4.22–5.81)
RBC: 3.27 MIL/uL — ABNORMAL LOW (ref 4.22–5.81)
RDW: 18.7 % — ABNORMAL HIGH (ref 11.5–15.5)
RDW: 18.7 % — ABNORMAL HIGH (ref 11.5–15.5)
WBC: 8.2 10*3/uL (ref 4.0–10.5)
WBC: 6.4 10*3/uL (ref 4.0–10.5)
nRBC: 0 % (ref 0.0–0.2)
nRBC: 0 % (ref 0.0–0.2)

## 2018-08-15 LAB — MAGNESIUM: Magnesium: 1.6 mg/dL — ABNORMAL LOW (ref 1.7–2.4)

## 2018-08-15 LAB — TROPONIN I: Troponin I: <0.03 ng/mL (ref <0.03)

## 2018-08-15 LAB — URINALYSIS, ROUTINE W REFLEX MICROSCOPIC
Glucose, UA: 50 mg/dL — AB
Ketones, ur: 5 mg/dL — AB
Leukocytes,Ua: NEGATIVE
Nitrite: NEGATIVE
Protein, ur: 100 mg/dL — AB
RBC / HPF: >50 RBC/hpf — ABNORMAL HIGH (ref 0–5)
Specific Gravity, Urine: 1.017 (ref 1.005–1.030)
pH: 5 (ref 5.0–8.0)

## 2018-08-15 LAB — LACTIC ACID, PLASMA
Lactic Acid, Venous: 3.9 mmol/L (ref 0.5–1.9)
Lactic Acid, Venous: 3.9 mmol/L (ref 0.5–1.9)

## 2018-08-15 LAB — SARS CORONAVIRUS 2: SARS Coronavirus 2: NOT DETECTED

## 2018-08-15 LAB — COMPREHENSIVE METABOLIC PANEL
ALT: 63 U/L — ABNORMAL HIGH (ref 0–44)
AST: 226 U/L — ABNORMAL HIGH (ref 15–41)
Albumin: 2.4 g/dL — ABNORMAL LOW (ref 3.5–5.0)
Alkaline Phosphatase: 189 U/L — ABNORMAL HIGH (ref 38–126)
Anion gap: 17 — ABNORMAL HIGH (ref 5–15)
BUN: 32 mg/dL — ABNORMAL HIGH (ref 6–20)
CO2: 18 mmol/L — ABNORMAL LOW (ref 22–32)
Calcium: 8.4 mg/dL — ABNORMAL LOW (ref 8.9–10.3)
Chloride: 85 mmol/L — ABNORMAL LOW (ref 98–111)
Creatinine, Ser: 2.48 mg/dL — ABNORMAL HIGH (ref 0.61–1.24)
GFR calc Af Amer: 36 mL/min — ABNORMAL LOW (ref >60)
GFR calc non Af Amer: 31 mL/min — ABNORMAL LOW (ref >60)
Glucose, Bld: 83 mg/dL (ref 70–99)
Potassium: 5.2 mmol/L — ABNORMAL HIGH (ref 3.5–5.1)
Sodium: 120 mmol/L — ABNORMAL LOW (ref 135–145)
Total Bilirubin: 28.5 mg/dL (ref 0.3–1.2)
Total Protein: 6.4 g/dL — ABNORMAL LOW (ref 6.5–8.1)

## 2018-08-15 LAB — PROTIME-INR
INR: 2.2 — ABNORMAL HIGH (ref 0.8–1.2)
Prothrombin Time: 23.9 seconds — ABNORMAL HIGH (ref 11.4–15.2)

## 2018-08-15 LAB — ABO/RH: ABO/RH(D): A NEG

## 2018-08-15 LAB — AMMONIA: Ammonia: 49 umol/L — ABNORMAL HIGH (ref 9–35)

## 2018-08-15 LAB — LIPASE, BLOOD: Lipase: 102 U/L — ABNORMAL HIGH (ref 11–51)

## 2018-08-15 LAB — POC OCCULT BLOOD, ED: Fecal Occult Bld: POSITIVE — AB

## 2018-08-15 LAB — ETHANOL: Alcohol, Ethyl (B): 22 mg/dL — ABNORMAL HIGH (ref <10)

## 2018-08-15 MED ORDER — VITAMIN K1 10 MG/ML IJ SOLN
5.0000 mg | Freq: Once | INTRAVENOUS | Status: AC
  Administered 2018-08-15: 16:00:00 5 mg via INTRAVENOUS
  Filled 2018-08-15: qty 0.5

## 2018-08-15 MED ORDER — SODIUM CHLORIDE 0.9 % IV SOLN: 80.0000 mg | Freq: Once | INTRAVENOUS | Status: DC

## 2018-08-15 MED ORDER — SODIUM CHLORIDE 0.9 % IV SOLN: 8.0000 mg/h | INTRAVENOUS | Status: DC

## 2018-08-15 MED ORDER — PANTOPRAZOLE SODIUM 40 MG IV SOLR: 40.0000 mg | Freq: Two times a day (BID) | INTRAVENOUS | Status: DC

## 2018-08-15 MED ORDER — ADULT MULTIVITAMIN W/MINERALS CH
1.0000 | ORAL_TABLET | Freq: Every day | ORAL | Status: DC
  Administered 2018-08-15 – 2018-08-25 (×10): 1 via ORAL
  Filled 2018-08-15 (×11): qty 1

## 2018-08-15 MED ORDER — SODIUM CHLORIDE 0.9 % IV SOLN
80.0000 mg | Freq: Once | INTRAVENOUS | Status: AC
  Administered 2018-08-15: 80 mg via INTRAVENOUS
  Filled 2018-08-15: qty 80

## 2018-08-15 MED ORDER — SODIUM CHLORIDE 0.9 % IV SOLN
INTRAVENOUS | Status: DC
  Administered 2018-08-15 – 2018-08-16 (×3): via INTRAVENOUS

## 2018-08-15 MED ORDER — THIAMINE HCL 100 MG/ML IJ SOLN
100.0000 mg | Freq: Every day | INTRAMUSCULAR | Status: DC
  Administered 2018-08-16: 100 mg via INTRAVENOUS
  Filled 2018-08-15: qty 2

## 2018-08-15 MED ORDER — ALBUTEROL SULFATE (2.5 MG/3ML) 0.083% IN NEBU: 2.5000 mg | INHALATION_SOLUTION | Freq: Four times a day (QID) | RESPIRATORY_TRACT | Status: DC

## 2018-08-15 MED ORDER — ONDANSETRON HCL 4 MG PO TABS: 4.0000 mg | ORAL_TABLET | Freq: Four times a day (QID) | ORAL | Status: DC | PRN

## 2018-08-15 MED ORDER — SODIUM CHLORIDE 0.9 % IV SOLN: Freq: Once | INTRAVENOUS | Status: DC

## 2018-08-15 MED ORDER — IPRATROPIUM BROMIDE 0.02 % IN SOLN: 0.5000 mg | Freq: Four times a day (QID) | RESPIRATORY_TRACT | Status: DC

## 2018-08-15 MED ORDER — SODIUM CHLORIDE 0.9 % IV BOLUS
1000.0000 mL | Freq: Once | INTRAVENOUS | Status: AC
  Administered 2018-08-15: 09:00:00 1000 mL via INTRAVENOUS

## 2018-08-15 MED ORDER — SODIUM CHLORIDE 0.9 % IV SOLN
8.0000 mg/h | INTRAVENOUS | Status: DC
  Administered 2018-08-15: 10:00:00 8 mg/h via INTRAVENOUS
  Filled 2018-08-15: qty 80

## 2018-08-15 MED ORDER — LORAZEPAM 1 MG PO TABS: 1.0000 mg | ORAL_TABLET | Freq: Four times a day (QID) | ORAL | Status: AC | PRN

## 2018-08-15 MED ORDER — ONDANSETRON HCL 4 MG/2ML IJ SOLN
4.0000 mg | Freq: Once | INTRAMUSCULAR | Status: AC
  Administered 2018-08-15: 10:00:00 4 mg via INTRAVENOUS
  Filled 2018-08-15: qty 2

## 2018-08-15 MED ORDER — MORPHINE SULFATE (PF) 4 MG/ML IV SOLN
4.0000 mg | Freq: Once | INTRAVENOUS | Status: AC
  Administered 2018-08-15: 10:00:00 4 mg via INTRAVENOUS
  Filled 2018-08-15: qty 1

## 2018-08-15 MED ORDER — PANTOPRAZOLE SODIUM 40 MG IV SOLR
40.0000 mg | Freq: Two times a day (BID) | INTRAVENOUS | Status: DC
  Administered 2018-08-15 – 2018-08-21 (×12): 40 mg via INTRAVENOUS
  Filled 2018-08-15 (×13): qty 40

## 2018-08-15 MED ORDER — FOLIC ACID 1 MG PO TABS
1.0000 mg | ORAL_TABLET | Freq: Every day | ORAL | Status: DC
  Administered 2018-08-15 – 2018-08-25 (×10): 1 mg via ORAL
  Filled 2018-08-15 (×11): qty 1

## 2018-08-15 MED ORDER — SODIUM CHLORIDE 0.9 % IV BOLUS
1000.0000 mL | Freq: Once | INTRAVENOUS | Status: AC
  Administered 2018-08-15: 22:00:00 1000 mL via INTRAVENOUS

## 2018-08-15 MED ORDER — LORAZEPAM 2 MG/ML IJ SOLN: 1.0000 mg | Freq: Four times a day (QID) | INTRAMUSCULAR | Status: AC | PRN

## 2018-08-15 MED ORDER — VITAMIN B-1 100 MG PO TABS
100.0000 mg | ORAL_TABLET | Freq: Every day | ORAL | Status: DC
  Administered 2018-08-15 – 2018-08-25 (×10): 100 mg via ORAL
  Filled 2018-08-15 (×11): qty 1

## 2018-08-15 MED ORDER — SODIUM CHLORIDE 0.9 % IV BOLUS
1000.0000 mL | Freq: Once | INTRAVENOUS | Status: AC
  Administered 2018-08-15: 10:00:00 1000 mL via INTRAVENOUS

## 2018-08-15 MED ORDER — IPRATROPIUM-ALBUTEROL 0.5-2.5 (3) MG/3ML IN SOLN
3.0000 mL | Freq: Four times a day (QID) | RESPIRATORY_TRACT | Status: DC
  Administered 2018-08-16: 08:00:00 3 mL via RESPIRATORY_TRACT
  Filled 2018-08-15 (×2): qty 3

## 2018-08-15 MED ORDER — SODIUM CHLORIDE 0.9 % IV BOLUS
500.0000 mL | Freq: Once | INTRAVENOUS | Status: AC
  Administered 2018-08-15: 500 mL via INTRAVENOUS

## 2018-08-15 MED ORDER — MAGNESIUM SULFATE 2 GM/50ML IV SOLN
2.0000 g | Freq: Once | INTRAVENOUS | Status: AC
  Administered 2018-08-15: 10:00:00 2 g via INTRAVENOUS
  Filled 2018-08-15: qty 50

## 2018-08-15 MED ORDER — METOCLOPRAMIDE HCL 5 MG/ML IJ SOLN
10.0000 mg | Freq: Four times a day (QID) | INTRAMUSCULAR | Status: AC
  Administered 2018-08-15 – 2018-08-16 (×3): 10 mg via INTRAVENOUS
  Filled 2018-08-15 (×3): qty 2

## 2018-08-15 MED ORDER — ONDANSETRON HCL 4 MG/2ML IJ SOLN: 4.0000 mg | Freq: Four times a day (QID) | INTRAMUSCULAR | Status: DC | PRN

## 2018-08-15 NOTE — Progress Notes (Signed)
Patient's BP 82/40. 1L bolus of NS ordered. Patient not allowing this RN to start the bolus yet due to patient requesting privacy while in bathroom. Monitoring closely.

## 2018-08-15 NOTE — ED Notes (Signed)
Date and time results received: 08/15/18 1005 (use smartphrase ".now" to insert current time)  Test: lactic Critical Value: 3.9  Name of Provider Notified: Gilford Raid, MD  Orders Received? Or Actions Taken?: .

## 2018-08-15 NOTE — Progress Notes (Signed)
Verbal orders received for 1/2 bolus of NS and for a repeat H&H post bolus. Orders placed. Will continue to monitor.

## 2018-08-15 NOTE — ED Notes (Signed)
Pt does not want his emergency contact (Brother) contacted about him being here. Pt states that it is his brothers birthday and he doesn't want to ruin his bday.

## 2018-08-15 NOTE — ED Triage Notes (Signed)
Pt arrives ems w/co diarrhea x 2 weeks. Pt didn't realize he was so yellow until EMS pointed it out. Pt relates lightheaded.

## 2018-08-15 NOTE — Progress Notes (Addendum)
Joseph Valenzuela [id number] Admission Data: 08/15/2018 6:37 PM Attending Provider: Merton Border, MD  [id number], Alinda Sierras, MD Consults/ Treatment Team: Treatment Team:  Ladene Artist, MD  Joseph Valenzuela is a 43 y.o. male patient admitted from ED awake, alert  & oriented 4,  Full Code, VSS - BP 95/48, Pulse 72, temp 98.3, sp02 97% O2 on RA, no c/o shortness of breath, no c/o chest pain, no distress noted. Tele # M02 placed and pt is currently running:normal sinus rhythm.  Allergies:  No Known Allergies   Past Medical History:  Diagnosis Date  . Alcoholism ([place])   . Anxiety   . Asthma   . Back pain, chronic   . Barrett esophagus   . Benign positional vertigo   . Candidiasis of mouth   . Degenerative spinal arthritis   . Depression with anxiety   . Esophageal reflux   . Fatty liver   . Herniated disc   . Hiatal hernia   . Hypertension   . Irritable bowel syndrome   . Obesity   . Spleen enlarged   . Suicidal ideations   . Vitamin B12 deficiency      Pt orientation to unit, room and routine. Information packet given to patient/family and safety video watched.  Admission INP armband ID verified with patient/family, and in place. SR up x 2, fall risk assessment complete with Patient and family verbalizing understanding of risks associated with falls. Pt verbalizes an understanding of how to use the call bell and to call for help before getting out of bed.  Skin, clean-dry- intact without evidence of bruising, or skin tears.    Will cont to monitor and assist as needed.  Hiram Comber, RN 08/15/2018 17:14 PM

## 2018-08-15 NOTE — ED Notes (Signed)
Patient transported to CT 

## 2018-08-15 NOTE — ED Notes (Signed)
ED TO INPATIENT HANDOFF REPORT  ED Nurse Name and Phone #: Everlene Farrierhilary Saquan Furtick [phone]   S Name/Age/Gender Joseph Valenzuela 43 y.o. male Room/Bed: 017C/017C  Code Status   Code Status: Full Code  Home/SNF/Other Home Patient oriented to: self, place, time and situation Is this baseline? Yes   Triage Complete: Triage complete  Chief Complaint DIARRHEA JAUNDICE  Triage Note Pt arrives ems w/co diarrhea x 2 weeks. Pt didn't realize he was so yellow until EMS pointed it out. Pt relates lightheaded.     Allergies No Known Allergies  Level of Care/Admitting Diagnosis ED Disposition    ED Disposition Condition Comment   Admit  [facility] [[id number]]  Level of Care: Progressive [102]  Covid Evaluation: N/A  Diagnosis: GI bleed [[id number]][[id number]]  Admitting Physician: Carron CurieHIJAZI, ALI [[id number]][[id number]]  Attending Physician: Sharyon MedicusHIJAZI, ALI Bai.Lain[[id number]]  Estimated length of stay: past midnight tomorrow  Certification:: I certify this patient will need inpatient services for at least 2 midnights  PT Class (Do Not Modify): Inpatient [101]  PT Acc Code (Do Not Modify): Private [1]       B Medical/Surgery History Past Medical History:  Diagnosis Date  . Alcoholism (HCC)   . Anxiety   . Asthma   . Back pain, chronic   . Barrett esophagus   . Benign positional vertigo   . Candidiasis of mouth   . Degenerative spinal arthritis   . Depression with anxiety   . Esophageal reflux   . Fatty liver   . Herniated disc   . Hiatal hernia   . Hypertension   . Irritable bowel syndrome   . Obesity   . Spleen enlarged   . Suicidal ideations   . Vitamin B12 deficiency    Past Surgical History:  Procedure Laterality Date  . TYMPANOSTOMY TUBE PLACEMENT    . UPPER GASTROINTESTINAL ENDOSCOPY       A IV Location/Drains/Wounds Patient Lines/Drains/Airways Status   Active Line/Drains/Airways    Name:   Placement date:   Placement time:   Site:   Days:   Peripheral IV 08/15/18 Left  Antecubital   08/15/18    0913    Antecubital   less than 1          Intake/Output Last 24 hours  Intake/Output Summary (Last 24 hours) at 08/15/2018 1618 Last data filed at 08/15/2018 1224 Gross per 24 hour  Intake 1150 ml  Output -  Net 1150 ml    Labs/Imaging Results for orders placed or performed during the hospital encounter of 08/15/18 (from the past 48 hour(s))  CBC with Differential     Status: Abnormal   Collection Time: 08/15/18  9:15 AM  Result Value Ref Range   WBC 8.2 4.0 - 10.5 K/uL   RBC 3.43 (L) 4.22 - 5.81 MIL/uL   Hemoglobin 10.8 (L) 13.0 - 17.0 g/dL   HCT 47.830.4 (L) 29.539.0 - 62.152.0 %   MCV 88.6 80.0 - 100.0 fL   MCH 31.5 26.0 - 34.0 pg   MCHC 35.5 30.0 - 36.0 g/dL   RDW 30.818.7 (H) 65.711.5 - 84.615.5 %   Platelets 170 150 - 400 K/uL   nRBC 0.0 0.0 - 0.2 %   Neutrophils Relative % 74 %   Neutro Abs 6.1 1.7 - 7.7 K/uL   Lymphocytes Relative 13 %   Lymphs Abs 1.1 0.7 - 4.0 K/uL   Monocytes Relative 11 %   Monocytes Absolute 0.9 0.1 - 1.0 K/uL   Eosinophils  Relative 1 %   Eosinophils Absolute 0.1 0.0 - 0.5 K/uL   Basophils Relative 0 %   Basophils Absolute 0.0 0.0 - 0.1 K/uL   Immature Granulocytes 1 %   Abs Immature Granulocytes 0.08 (H) 0.00 - 0.07 K/uL    Comment: Performed at [facility], [address] [place], [place] [address]  Comprehensive metabolic panel     Status: Abnormal   Collection Time: 08/15/18  9:15 AM  Result Value Ref Range   Sodium 120 (L) 135 - 145 mmol/L   Potassium 5.2 (H) 3.5 - 5.1 mmol/L   Chloride 85 (L) 98 - 111 mmol/L   CO2 18 (L) 22 - 32 mmol/L   Glucose, Bld 83 70 - 99 mg/dL   BUN 32 (H) 6 - 20 mg/dL   Creatinine, Ser 6.042.48 (H) 0.61 - 1.24 mg/dL   Calcium 8.4 (L) 8.9 - 10.3 mg/dL   Total Protein 6.4 (L) 6.5 - 8.1 g/dL   Albumin 2.4 (L) 3.5 - 5.0 g/dL   AST 540226 (H) 15 - 41 U/L   ALT 63 (H) 0 - 44 U/L   Alkaline Phosphatase 189 (H) 38 - 126 U/L   Total Bilirubin 28.5 (HH) 0.3 - 1.2 mg/dL    Comment: CRITICAL RESULT CALLED  TO, READ BACK BY AND VERIFIED WITH: H.Kairav Russomanno,RN 1033 08/15/2018 CLARK,S    GFR calc non Af Amer 31 (L) >60 mL/min   GFR calc Af Amer 36 (L) >60 mL/min   Anion gap 17 (H) 5 - 15    Comment: Performed at [facility], [address] [place], [place] [address]  Lipase, blood     Status: Abnormal   Collection Time: 08/15/18  9:15 AM  Result Value Ref Range   Lipase 102 (H) 11 - 51 U/L    Comment: Performed at [facility], [address] [place], [place] [address]  Ammonia     Status: Abnormal   Collection Time: 08/15/18  9:15 AM  Result Value Ref Range   Ammonia 49 (H) 9 - 35 umol/L    Comment: Performed at [facility], [address] [place], [place] [address]  Protime-INR     Status: Abnormal   Collection Time: 08/15/18  9:15 AM  Result Value Ref Range   Prothrombin Time 23.9 (H) 11.4 - 15.2 seconds   INR 2.2 (H) 0.8 - 1.2    Comment: (NOTE) INR goal varies based on device and disease states. Performed at [facility], [address] [place], [place] [address]   Lactic acid, plasma     Status: Abnormal   Collection Time: 08/15/18  9:15 AM  Result Value Ref Range   Lactic Acid, Venous 3.9 (HH) 0.5 - 1.9 mmol/L    Comment: CRITICAL RESULT CALLED TO, READ BACK BY AND VERIFIED WITH: H.HARDEN,RN 1005 08/15/2018 CLARK,S Performed at [facility], [address] [place], [place] [address]   Troponin I - ONCE - STAT     Status: None   Collection Time: 08/15/18  9:15 AM  Result Value Ref Range   Troponin I <0.03 <0.03 ng/mL    Comment: Performed at [facility], [address] [place], [place] [address]  Ethanol     Status: Abnormal   Collection Time: 08/15/18  9:15 AM  Result Value Ref Range   Alcohol, Ethyl (B) 22 (H) <10 mg/dL    Comment: (NOTE) Lowest detectable limit for serum alcohol  is 10 mg/dL. For medical purposes only. Performed at [facility], [address] [place], [place] [address]   Type and screen [facility]     Status: None   Collection Time: 08/15/18  9:16 AM  Result Value Ref Range   ABO/RH(D) A NEG    Antibody Screen NEG    Sample Expiration      08/18/2018,2359 Performed at [facility], [address] [place], [place] [address]   ABO/Rh     Status: None   Collection Time: 08/15/18  9:16 AM  Result Value Ref Range   ABO/RH(D)      A NEG Performed at [facility], [address] [place], [place] [address]   Magnesium     Status: Abnormal   Collection Time: 08/15/18  9:30 AM  Result Value Ref Range   Magnesium 1.6 (L) 1.7 - 2.4 mg/dL    Comment: Performed at [facility], [address] [place], [place] [address]  POC occult blood, ED Provider will collect     Status: Abnormal   Collection Time: 08/15/18  9:34 AM  Result Value Ref Range   Fecal Occult Bld POSITIVE (A) NEGATIVE  Lactic acid, plasma     Status: Abnormal   Collection Time: 08/15/18 11:25 AM  Result Value Ref Range   Lactic Acid, Venous 3.9 (HH) 0.5 - 1.9 mmol/L    Comment: CRITICAL RESULT CALLED TO, READ BACK BY AND VERIFIED WITH: H.Irven Ingalsbe,RN 1210 08/15/2018 CLARK,S Performed at [facility], [address] [place], [place] [address]   SARS Coronavirus 2     Status: None   Collection Time: 08/15/18  2:19 PM  Result Value Ref Range   SARS Coronavirus 2 NOT DETECTED NOT DETECTED    Comment: (NOTE) SARS-CoV-2 target nucleic acids are NOT DETECTED. The SARS-CoV-2 RNA is generally detectable in upper and lower respiratory specimens during the acute phase of infection.  Negative  results do not preclude SARS-CoV-2 infection, do not rule out co-infections with other pathogens, and should not be used as the sole basis for treatment or other patient management decisions.  Negative results must be combined with clinical observations, patient history, and epidemiological information. The expected result is Not Detected. Fact Sheet for  Patients: http://www.biofiredefense.com/wp-content/uploads/2020/03/BIOFIRE-COVID -19-patients.pdf Fact Sheet for Healthcare Providers: http://www.biofiredefense.com/wp-content/uploads/2020/03/BIOFIRE-COVID -19-hcp.pdf This test is not yet approved or cleared by the [place] FDA and  has been authorized for detection and/or diagnosis of SARS-CoV-2 by FDA under an Emergency Use Authorization (EUA).  This EUA will remain in effec t (meaning this test can be used) for the duration of  the COVID-19 declaration under Section 564(b)(1) of the Act, 21 U.S.C. section 360bbb-3(b)(1), unless the authorization is terminated or revoked sooner. Performed at [facility], [address] [place], [place] [address]    Ct Abdomen Pelvis Wo Contrast  Result Date: 08/15/2018 CLINICAL DATA:  Painless jaundice, fatigue and abdominal swelling. EXAM: CT ABDOMEN AND PELVIS WITHOUT CONTRAST TECHNIQUE: Multidetector CT imaging of the abdomen and pelvis was performed following the standard protocol without IV contrast. COMPARISON:  06/18/2013 FINDINGS: Lower chest: Small left pleural effusion noted with overlying atelectasis. The right lung base is clear. The heart is normal in size. No pericardial effusion. Marked eventration of the right hemidiaphragm with overlying atelectasis. Hepatobiliary: Hepatomegaly. The liver contour is irregular and the caudate lobe is prominent. The hepatic fissures are also slightly prominent. Findings consistent with cirrhosis. There  is also diffuse heterogeneous fatty infiltration and possible superimposed hepatitis. No obvious hepatic lesion. The gallbladder is distended and demonstrates high attenuation which could be sludge. There also several small calcified gallstones. The gallbladder wall is thickened but this could be due to ascites and low albumin. No intra or extrahepatic biliary dilatation to suggest biliary obstruction. Pancreas: No mass, inflammation or ductal  dilatation. Spleen: Splenomegaly. Spleen measures 16 x 16 x 12.5 cm. No worrisome lesions. Adrenals/Urinary Tract: Adrenal glands and kidneys are unremarkable. No renal, ureteral or bladder calculi or obvious mass without contrast. Stomach/Bowel: The stomach, duodenum, small bowel and colon are grossly normal without oral contrast. No acute inflammatory changes, mass lesions or obstructive findings. Vascular/Lymphatic: Scattered atherosclerotic calcifications involving the aorta but no aneurysm. There are numerous borderline upper abdominal lymph nodes typical with cirrhosis. Scattered retroperitoneal lymph nodes are also noted but no mass or overt adenopathy. Reproductive: The prostate gland and seminal vesicles are unremarkable. Other: Small volume abdominal/pelvic ascites. There is also diffuse mesenteric edema and diffuse body wall edema. Musculoskeletal: No significant bony findings. IMPRESSION: 1. CT findings consistent with cirrhosis and fatty infiltration of the liver. No focal hepatic lesions or evidence of biliary obstruction. There could be superimposed hepatitis. 2. Evidence of portal venous hypertension with portal venous collaterals, splenomegaly and ascites. 3. Distended gallbladder with high attenuation material which could suggest sludge. Several gallstones are also noted. Gallbladder wall thickening likely due to ascites and low albumin. 4. Small left pleural effusion with overlying atelectasis. 5. Diffuse body wall edema. Electronically Signed   By: Marijo Sanes M.D.   On: 08/15/2018 11:53   Dg Chest Portable 1 View  Result Date: 08/15/2018 CLINICAL DATA:  Anemia.  Vomiting. EXAM: PORTABLE CHEST 1 VIEW COMPARISON:  January 01, 2018 FINDINGS: Minimal opacity in the lateral left lung base. There is an elevated right hemidiaphragm. There is also opacity in the right lung base. The cardiomediastinal silhouette is normal. IMPRESSION: 1. The opacity in the right lung base may represent a pleural  effusion and atelectasis. Infiltrate not excluded. 2. Mild opacity in the lateral left lung base favored represent atelectasis. Subtle infiltrate not excluded. Electronically Signed   By: Dorise Bullion III M.D   On: 08/15/2018 10:47    Pending Labs Unresulted Labs (From admission, onward)    Start     Ordered   08/16/18 0500  CBC  Tomorrow morning,   R     08/15/18 1419   08/16/18 0370  Basic metabolic panel  Tomorrow morning,   R     08/15/18 1419   08/16/18 0500  Hepatic function panel  Tomorrow morning,   R     08/15/18 1423   08/16/18 0500  Protime-INR  Tomorrow morning,   R    Question:  Specimen collection method  Answer:  IV Team   08/15/18 1424   08/15/18 1419  HIV antibody (Routine Testing)  Once,   STAT     08/15/18 1419   08/15/18 1008  Novel Coronavirus,NAA,(SEND-OUT TO REF LAB - TAT 24-48 hrs); Hosp Order  (Asymptomatic Patients Labs)  Once,   STAT    Question:  Rule Out  Answer:  Yes   08/15/18 1007   08/15/18 0911  Urinalysis, Routine w reflex microscopic  (ED Abdominal Pain)  ONCE - STAT,   STAT     08/15/18 0911   Signed and Held  Hepatitis panel, acute  Once,   R     Signed and Held  Vitals/Pain Today's Vitals   08/15/18 1530 08/15/18 1545 08/15/18 1600 08/15/18 1615  BP: (!) 106/48 (!) 97/48 (!) 112/38 106/77  Pulse: 67 69 66 76  Resp: Temp:      TempSrc:      SpO2: 94% 94% 94% 94%  Weight:      Height:      PainSc:        Isolation Precautions No active isolations  Medications Medications  LORazepam (ATIVAN) tablet 1 mg (has no administration in time range)    Or  LORazepam (ATIVAN) injection 1 mg (has no administration in time range)  thiamine (VITAMIN B-1) tablet 100 mg (has no administration in time range)    Or  thiamine (B-1) injection 100 mg (has no administration in time range)  folic acid (FOLVITE) tablet 1 mg (has no administration in time range)  multivitamin with minerals tablet 1 tablet (has no  administration in time range)  0.9 %  sodium chloride infusion ( Intravenous New Bag/Given 08/15/18 1548)  ondansetron (ZOFRAN) tablet 4 mg (has no administration in time range)    Or  ondansetron (ZOFRAN) injection 4 mg (has no administration in time range)  albuterol (PROVENTIL) (2.5 MG/3ML) 0.083% nebulizer solution 2.5 mg (has no administration in time range)  ipratropium (ATROVENT) nebulizer solution 0.5 mg (has no administration in time range)  pantoprazole (PROTONIX) injection 40 mg (has no administration in time range)  metoCLOPramide (REGLAN) injection 10 mg (has no administration in time range)  phytonadione (VITAMIN K) 5 mg in dextrose 5 % 50 mL IVPB (5 mg Intravenous New Bag/Given 08/15/18 1555)  sodium chloride 0.9 % bolus 1,000 mL (0 mLs Intravenous Stopped 08/15/18 1025)  morphine 4 MG/ML injection 4 mg (4 mg Intravenous Given 08/15/18 0935)  ondansetron (ZOFRAN) injection 4 mg (4 mg Intravenous Given 08/15/18 0935)  pantoprazole (PROTONIX) 80 mg in sodium chloride 0.9 % 100 mL IVPB (0 mg Intravenous Stopped 08/15/18 1224)  sodium chloride 0.9 % bolus 1,000 mL (1,000 mLs Intravenous New Bag/Given 08/15/18 1008)  magnesium sulfate IVPB 2 g 50 mL (0 g Intravenous Stopped 08/15/18 1126)    Mobility walks Moderate fall risk stand by assist   Focused Assessments Cardiac Assessment Handoff:  Cardiac Rhythm: Normal sinus rhythm Lab Results  Component Value Date   TROPONINI <0.03 08/15/2018   No results found for: DDIMER Does the Patient currently have chest pain? No     R Recommendations: See Admitting Provider Note  Report given to:   Additional Notes: .

## 2018-08-15 NOTE — Progress Notes (Signed)
Patient's blood pressure has been on soft side since admission to unit. Latest BP 89/45. Dr. Laren Everts paged. Awaiting response/orders. Will continue to monitor.   Hiram Comber, RN 08/15/2018 6:37 PM

## 2018-08-15 NOTE — ED Provider Notes (Signed)
[facility] Provider Note   CSN: [id number] Arrival date & time: 08/15/18  [id number]    History   Chief Complaint Chief Complaint  Patient presents with   Diarrhea   Jaundice    HPI Joseph Valenzuela is a 43 y.o. male.     Pt presents to the ED today with vomiting blood, urinating blood, and black stool.  The pt said he has not been feeling well for 2 weeks.  He called EMS this morning because he noticed blood in his urine.  That was new.  Pt has a hx of alcohol abuse.  He did not notice that he was turning yellow.  He lives alone and has infrequent visitors.  The pt c/o pain all over his abdomen.  He denies f/c.  No known covid exposures.     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/09/2018   Spleen enlargement 06/17/2013   Abdominal pain, unspecified site 06/17/2013   Erectile dysfunction 03/26/2013   Severe obesity (BMI >= 40) (HCC) 03/26/2013   Morbid obesity (HCC) 10/01/2010   GERD (gastroesophageal reflux disease) 10/01/2010   Anxiety and depression 10/01/2010   Hypertension 10/01/2010   BACK PAIN, CHRONIC 09/29/2009   Vitamin B 12 deficiency 09/16/2009   CHEST PAIN UNSPECIFIED 09/16/2009   EDEMA 09/15/2009   HERNIATED DISC 12/08/2008   DISC DISEASE, LUMBAR 08/16/2008   ASTHMA 03/11/2008   History of alcohol abuse 03/06/2008   EXTRINSIC ASTHMA, UNSPECIFIED  03/01/2008   DEPRESSION 09/18/2007   Obesity 07/31/2007   GERD 07/03/2007   IRRITABLE BOWEL SYNDROME 07/03/2007   Fatty liver 07/03/2007   ABDOMINAL PAIN-EPIGASTRIC 07/03/2007   BENIGN POSITIONAL VERTIGO 05/21/2007   Anxiety state 04/26/2007   Essential hypertension 04/26/2007    Past Surgical History:  Procedure Laterality Date   TYMPANOSTOMY TUBE PLACEMENT     UPPER GASTROINTESTINAL ENDOSCOPY          Home Medications    Prior to Admission medications   Medication Sig Start Date End Date Taking? Authorizing Provider  clonazePAM (KLONOPIN) 1 MG tablet Take one by mouth bid Patient taking differently: Take 1 mg by mouth 2 (two) times daily. Take one by mouth bid 05/30/18  Yes Burchette, Elberta FortisBruce W, MD  dexlansoprazole (DEXILANT) 60 MG capsule Take 60 mg by mouth daily.   Yes [provider]  lisinopril-hydrochlorothiazide (PRINZIDE,ZESTORETIC) 20-12.5 MG tablet Take 1 tablet by mouth daily.   Yes [provider]    Family History Family History  Problem Relation Age of Onset   Alcohol abuse Father    Depression Mother    Diabetes Mother    Hypertension Brother    Diabetes Maternal Aunt    Stomach cancer Maternal Grandmother  great   Brain cancer Maternal Grandfather    Colon cancer Neg Hx    Esophageal cancer Neg Hx     Social History Social History   Tobacco Use   Smoking status: Never Smoker   Smokeless tobacco: Never Used  Substance Use Topics   Alcohol use: Yes    Alcohol/week: 0.0 standard drinks    Comment: drink twice a week per pt.   Drug use: No    Types: Marijuana    Comment: previous hx     Allergies   Patient has no known allergies.   Review of Systems Review of Systems  Gastrointestinal: Positive for blood in stool, nausea and vomiting.  Genitourinary: Positive for hematuria.  Neurological: Positive for weakness.  All other systems reviewed and are negative.    Physical Exam Updated  Vital Signs BP (!) 97/43    Pulse 61    Temp 97.9 F (36.6 C) (Oral)    Resp 16    Ht 5\' 9"  (1.753 m)    Wt 130.5 kg    SpO2 95%    BMI 42.49 kg/m   Physical Exam Vitals signs and nursing note reviewed.  Constitutional:      Appearance: He is ill-appearing.  HENT:     Head: Normocephalic and atraumatic.     Right Ear: External ear normal.     Left Ear: External ear normal.     Nose: Nose normal.     Mouth/Throat:     Mouth: Mucous membranes are dry.     Pharynx: Oropharynx is clear.  Eyes:     General: Scleral icterus present.  Neck:     Musculoskeletal: Normal range of motion and neck supple.  Cardiovascular:     Rate and Rhythm: Normal rate and regular rhythm.     Pulses: Normal pulses.     Heart sounds: Normal heart sounds.  Pulmonary:     Effort: Pulmonary effort is normal.     Breath sounds: Normal breath sounds.  Abdominal:     General: Abdomen is flat. Bowel sounds are normal.     Palpations: Abdomen is soft. There is hepatomegaly.     Tenderness: There is generalized abdominal tenderness.  Genitourinary:    Rectum: Guaiac result positive.  Musculoskeletal: Normal range of motion.     Right lower leg: Edema present.     Left lower leg: Edema present.  Skin:    Capillary Refill: Capillary refill takes less than 2 seconds.     Coloration: Skin is jaundiced.  Neurological:     General: No focal deficit present.     Mental Status: He is alert and oriented to person, place, and time.  Psychiatric:        Mood and Affect: Affect is tearful.      ED Treatments / Results  Labs (all labs ordered are listed, but only abnormal results are displayed) Labs Reviewed  CBC WITH DIFFERENTIAL/PLATELET - Abnormal; Notable for the following components:      Result Value   RBC 3.43 (*)    Hemoglobin 10.8 (*)    HCT 30.4 (*)    RDW 18.7 (*)    Abs Immature Granulocytes 0.08 (*)    All other components within normal limits  COMPREHENSIVE METABOLIC PANEL - Abnormal;  Notable for the following components:   Sodium 120 (*)    Potassium 5.2 (*)    Chloride 85 (*)    CO2 18 (*)    BUN 32 (*)  Creatinine, Ser 2.48 (*)    Calcium 8.4 (*)    Total Protein 6.4 (*)    Albumin 2.4 (*)    AST 226 (*)    ALT 63 (*)    Alkaline Phosphatase 189 (*)    Total Bilirubin 28.5 (*)    GFR calc non Af Amer 31 (*)    GFR calc Af Amer 36 (*)    Anion gap 17 (*)    All other components within normal limits  LIPASE, BLOOD - Abnormal; Notable for the following components:   Lipase 102 (*)    All other components within normal limits  AMMONIA - Abnormal; Notable for the following components:   Ammonia 49 (*)    All other components within normal limits  PROTIME-INR - Abnormal; Notable for the following components:   Prothrombin Time 23.9 (*)    INR 2.2 (*)    All other components within normal limits  LACTIC ACID, PLASMA - Abnormal; Notable for the following components:   Lactic Acid, Venous 3.9 (*)    All other components within normal limits  LACTIC ACID, PLASMA - Abnormal; Notable for the following components:   Lactic Acid, Venous 3.9 (*)    All other components within normal limits  ETHANOL - Abnormal; Notable for the following components:   Alcohol, Ethyl (B) 22 (*)    All other components within normal limits  MAGNESIUM - Abnormal; Notable for the following components:   Magnesium 1.6 (*)    All other components within normal limits  POC OCCULT BLOOD, ED - Abnormal; Notable for the following components:   Fecal Occult Bld POSITIVE (*)    All other components within normal limits  NOVEL CORONAVIRUS, NAA (HOSPITAL ORDER, SEND-OUT TO REF LAB)  TROPONIN I  URINALYSIS, ROUTINE W REFLEX MICROSCOPIC  TYPE AND SCREEN  ABO/RH    EKG EKG Interpretation  Date/Time:  Wednesday August 15 2018 09:26:38 EDT Ventricular Rate:  86 PR Interval:    QRS Duration: 110 QT Interval:  400 QTC Calculation: 479 R Axis:   39 Text Interpretation:  Sinus rhythm Low  voltage, extremity and precordial leads Borderline prolonged QT interval Confirmed by Jacalyn LefevreHaviland, Tasha Jindra [phone]([id number]) on 08/15/2018 9:29:42 AM Also confirmed by Jacalyn LefevreHaviland, Cresencia Asmus [phone]), editor Barbette Hairassel, Kerry [phone])  on 08/15/2018 10:02:05 AM   Radiology Ct Abdomen Pelvis Wo Contrast  Result Date: 08/15/2018 CLINICAL DATA:  Painless jaundice, fatigue and abdominal swelling. EXAM: CT ABDOMEN AND PELVIS WITHOUT CONTRAST TECHNIQUE: Multidetector CT imaging of the abdomen and pelvis was performed following the standard protocol without IV contrast. COMPARISON:  06/18/2013 FINDINGS: Lower chest: Small left pleural effusion noted with overlying atelectasis. The right lung base is clear. The heart is normal in size. No pericardial effusion. Marked eventration of the right hemidiaphragm with overlying atelectasis. Hepatobiliary: Hepatomegaly. The liver contour is irregular and the caudate lobe is prominent. The hepatic fissures are also slightly prominent. Findings consistent with cirrhosis. There is also diffuse heterogeneous fatty infiltration and possible superimposed hepatitis. No obvious hepatic lesion. The gallbladder is distended and demonstrates high attenuation which could be sludge. There also several small calcified gallstones. The gallbladder wall is thickened but this could be due to ascites and low albumin. No intra or extrahepatic biliary dilatation to suggest biliary obstruction. Pancreas: No mass, inflammation or ductal dilatation. Spleen: Splenomegaly. Spleen measures 16 x 16 x 12.5 cm. No worrisome lesions. Adrenals/Urinary Tract: Adrenal glands and kidneys are unremarkable. No renal, ureteral or bladder calculi or obvious mass without contrast.  Stomach/Bowel: The stomach, duodenum, small bowel and colon are grossly normal without oral contrast. No acute inflammatory changes, mass lesions or obstructive findings. Vascular/Lymphatic: Scattered atherosclerotic calcifications involving the aorta but no aneurysm.  There are numerous borderline upper abdominal lymph nodes typical with cirrhosis. Scattered retroperitoneal lymph nodes are also noted but no mass or overt adenopathy. Reproductive: The prostate gland and seminal vesicles are unremarkable. Other: Small volume abdominal/pelvic ascites. There is also diffuse mesenteric edema and diffuse body wall edema. Musculoskeletal: No significant bony findings. IMPRESSION: 1. CT findings consistent with cirrhosis and fatty infiltration of the liver. No focal hepatic lesions or evidence of biliary obstruction. There could be superimposed hepatitis. 2. Evidence of portal venous hypertension with portal venous collaterals, splenomegaly and ascites. 3. Distended gallbladder with high attenuation material which could suggest sludge. Several gallstones are also noted. Gallbladder wall thickening likely due to ascites and low albumin. 4. Small left pleural effusion with overlying atelectasis. 5. Diffuse body wall edema. Electronically Signed   By: Rudie Meyer M.D.   On: 08/15/2018 11:53   Dg Chest Portable 1 View  Result Date: 08/15/2018 CLINICAL DATA:  Anemia.  Vomiting. EXAM: PORTABLE CHEST 1 VIEW COMPARISON:  January 01, 2018 FINDINGS: Minimal opacity in the lateral left lung base. There is an elevated right hemidiaphragm. There is also opacity in the right lung base. The cardiomediastinal silhouette is normal. IMPRESSION: 1. The opacity in the right lung base may represent a pleural effusion and atelectasis. Infiltrate not excluded. 2. Mild opacity in the lateral left lung base favored represent atelectasis. Subtle infiltrate not excluded. Electronically Signed   By: Gerome Sam III M.D   On: 08/15/2018 10:47    Procedures Procedures (including critical care time)  Medications Ordered in ED Medications  pantoprazole (PROTONIX) 80 mg in sodium chloride 0.9 % 250 mL (0.32 mg/mL) infusion (8 mg/hr Intravenous New Bag/Given 08/15/18 1013)  sodium chloride 0.9 % bolus  1,000 mL (0 mLs Intravenous Stopped 08/15/18 1025)  morphine 4 MG/ML injection 4 mg (4 mg Intravenous Given 08/15/18 0935)  ondansetron (ZOFRAN) injection 4 mg (4 mg Intravenous Given 08/15/18 0935)  pantoprazole (PROTONIX) 80 mg in sodium chloride 0.9 % 100 mL IVPB (0 mg Intravenous Stopped 08/15/18 1224)  sodium chloride 0.9 % bolus 1,000 mL (1,000 mLs Intravenous New Bag/Given 08/15/18 1008)  magnesium sulfate IVPB 2 g 50 mL (0 g Intravenous Stopped 08/15/18 1126)     Initial Impression / Assessment and Plan / ED Course  I have reviewed the triage vital signs and the nursing notes.  Pertinent labs & imaging results that were available during my care of the patient were reviewed by me and considered in my medical decision making (see chart for details).       Pt's initial bp was in the 80s, so he was given 1L NS.  This brought his bp up to the 90s.  Pt's hgb down to 10.8 from 14.2 on 02/26/18.  The pt was typed and screened.  The pt's magnesium is 1.6, so he was given 2 g of mag.  He has an UGI bleed, likely from esophageal varices, so he was given a protonix bolus and drip.  The pt was d/w [facility], but they were able to find that he sees [facility].  I spoke to [facility] PA who will see pt.  Pt d/w Dr. Sharyon Medicus (triad) for admission.  CRITICAL CARE Performed by: Jacalyn Lefevre   Total critical care time: 45  minutes  Critical  care time was exclusive of separately billable procedures and treating other patients.  Critical care was necessary to treat or prevent imminent or life-threatening deterioration.  Critical care was time spent personally by me on the following activities: development of treatment plan with patient and/or surrogate as well as nursing, discussions with consultants, evaluation of patient's response to treatment, examination of patient, obtaining history from patient or surrogate, ordering and performing treatments and interventions, ordering and review of laboratory  studies, ordering and review of radiographic studies, pulse oximetry and re-evaluation of patient's condition.   Joseph Valenzuela was evaluated in Emergency Department on 08/15/2018 for the symptoms described in the history of present illness. He was evaluated in the context of the global COVID-19 pandemic, which necessitated consideration that the patient might be at risk for infection with the SARS-CoV-2 virus that causes COVID-19. Institutional protocols and algorithms that pertain to the evaluation of patients at risk for COVID-19 are in a state of rapid change based on information released by regulatory bodies including the CDC and federal and state organizations. These policies and algorithms were followed during the patient's care in the ED. Final Clinical Impressions(s) / ED Diagnoses   Final diagnoses:  Jaundice  UGIB (upper gastrointestinal bleed)  Hypomagnesemia  Acute post-hemorrhagic anemia  AKI (acute kidney injury) (HCC)  Dehydration  Alcoholic cirrhosis of liver with ascites (HCC)  Acute liver failure without hepatic coma    ED Discharge Orders    None       Jacalyn LefevreHaviland, Zadiel Leyh, MD 08/15/18 1305

## 2018-08-15 NOTE — H&P (Addendum)
[facility]                                                                                    Patient Demographics  Joseph Valenzuela, is a 43 y.o. male  CSN: [id number]  MRN: [id number]  DOB - 05/15/1975  Admit Date - 08/15/2018  Outpatient Primary MD for the patient is Kristian CoveyBurchette, Bruce W, MD   With History of -  Past Medical History:  Diagnosis Date  . Alcoholism (HCC)   . Anxiety   . Asthma   . Back pain, chronic   . Barrett esophagus   . Benign positional vertigo   . Candidiasis of mouth   . Degenerative spinal arthritis   . Depression   . Esophageal reflux   . Fatty liver   . Herniated disc   . Hiatal hernia   . Hypertension   . Irritable bowel syndrome   . Obesity   . Spleen enlarged   . Suicidal ideations   . Vitamin B12 deficiency       Past Surgical History:  Procedure Laterality Date  . TYMPANOSTOMY TUBE PLACEMENT    . UPPER GASTROINTESTINAL ENDOSCOPY      in for   Chief Complaint  Patient presents with  . Diarrhea  . Jaundice     HPI  Joseph Valenzuela  is a 43 y.o. male, with past medical history significant for hypertension, depression, Barrett's esophagus and alcoholism who is presenting with 2 weeks history of of nausea and vomiting that continued evolving to become bright red blood.  Patient has a history of alcoholism in the past, however he was able to stop until last month due to personal issues. Denies any history of  liver cirrhosis although reports history of fatty liver with enlarged spleen.  Patient denies abdominal pain, chest pain, cough, fever or chills.  He feels that his respirations are shallow and rapid.  In emergency room : Patient was noted to be jaundiced his lactic acid level was 3.9, sodium 120 potassium 5.2 creatinine 2.48.  His hemoglobin was 10.8.  Patient looked jaundiced with AST of 226 and total bilirubin of 28.5.  CT of abdomen and pelvis ordered in the emergency room still pending.  Gastroenterology was  consulted   Review of Systems    In addition to the HPI above, No Fever-chills, No Headache, No changes with Vision or hearing, No problems swallowing food or Liquids, No Chest pain, Cough or Shortness of Breath, No Abdominal pain, , No dysuria, No new skin rashes or bruises, No new joints pains-aches,  No new weakness, tingling, numbness in any extremity, No recent weight gain or loss, No polyuria, polydypsia or polyphagia,   All other systems were reviewed and were negative   Social History Social History   Tobacco Use  . Smoking status: Never Smoker  . Smokeless tobacco: Never Used  Substance Use Topics  . Alcohol use: Yes    Alcohol/week: 0.0 standard drinks    Comment: drink twice a week per pt.     Family History Family History  Problem Relation Age of Onset  . Alcohol abuse Father   . Depression Mother   .  Diabetes Mother   . Hypertension Brother   . Diabetes Maternal Aunt   . Stomach cancer Maternal Grandmother        great  . Brain cancer Maternal Grandfather   . Colon cancer Neg Hx   . Esophageal cancer Neg Hx      Prior to Admission medications   Medication Sig Start Date End Date Taking? Authorizing Provider  clonazePAM (KLONOPIN) 1 MG tablet Take one by mouth bid Patient taking differently: Take 1 mg by mouth 2 (two) times daily. Take one by mouth bid 05/30/18  Yes Burchette, Elberta FortisBruce W, MD  dexlansoprazole (DEXILANT) 60 MG capsule Take 60 mg by mouth daily.   Yes [provider]  lisinopril-hydrochlorothiazide (PRINZIDE,ZESTORETIC) 20-12.5 MG tablet Take 1 tablet by mouth daily.   Yes [provider]    No Known Allergies  Physical Exam  Vitals  Blood pressure (!) 97/43, pulse 61, temperature 97.9 F (36.6 C), temperature source Oral, resp. rate 16, height 5\' 9"  (1.753 m), weight 130.5 kg, SpO2 95 %.   1. General anxious male lying in bed, obese, scared  2. Normal affect and insight, Not Suicidal or Homicidal, Awake  Alert, Oriented X 3.  3. No F.N deficits, ALL C.Nerves Intact, Strength 5/5 all 4 extremities, Sensation intact all 4 extremities, Plantars down going.  4. Ears and Eyes appear Normal, Conjunctivae clear, PERRLA. Moist Oral Mucosa.  5. Supple Neck, No JVD, No cervical lymphadenopathy appriciated, No Carotid Bruits.  6. Symmetrical Chest wall movement, Good air movement bilaterally, CTAB.  7. RRR, No Gallops, Rubs or Murmurs, No Parasternal Heave.  8. Positive Bowel Sounds, Abdomen Soft, Non tender, No organomegaly appriciated,No rebound -guarding or rigidity.  9.  No Cyanosis, Normal Skin Turgor, No Skin Rash or Bruise.  10. Good muscle tone,  joints appear normal , no effusions, Normal ROM.    Data Review  CBC Recent Labs  Lab 08/15/18 0915  WBC 8.2  HGB 10.8*  HCT 30.4*  PLT 170  MCV 88.6  MCH 31.5  MCHC 35.5  RDW 18.7*  LYMPHSABS 1.1  MONOABS 0.9  EOSABS 0.1  BASOSABS 0.0   ------------------------------------------------------------------------------------------------------------------  Chemistries  Recent Labs  Lab 08/15/18 0915 08/15/18 0930  NA 120*  --   K 5.2*  --   CL 85*  --   CO2 18*  --   GLUCOSE 83  --   BUN 32*  --   CREATININE 2.48*  --   CALCIUM 8.4*  --   MG  --  1.6*  AST 226*  --   ALT 63*  --   ALKPHOS 189*  --   BILITOT 28.5*  --    ------------------------------------------------------------------------------------------------------------------ estimated creatinine clearance is 52.4 mL/min (A) (by C-G formula based on SCr of 2.48 mg/dL (H)). ------------------------------------------------------------------------------------------------------------------ No results for input(s): TSH, T4TOTAL, T3FREE, THYROIDAB in the last 72 hours.  Invalid input(s): FREET3   Coagulation profile Recent Labs  Lab 08/15/18 0915  INR 2.2*    ------------------------------------------------------------------------------------------------------------------- No results for input(s): DDIMER in the last 72 hours. -------------------------------------------------------------------------------------------------------------------  Cardiac Enzymes Recent Labs  Lab 08/15/18 0915  TROPONINI <0.03   ------------------------------------------------------------------------------------------------------------------ Invalid input(s): POCBNP   ---------------------------------------------------------------------------------------------------------------  Urinalysis    Component Value Date/Time   COLORURINE YELLOW 11/29/2009 0506   APPEARANCEUR CLEAR 11/29/2009 0506   LABSPEC 1.010 11/29/2009 0506   PHURINE 6.0 11/29/2009 0506   GLUCOSEU NEGATIVE 11/29/2009 0506   HGBUR NEGATIVE 11/29/2009 0506   BILIRUBINUR NEGATIVE 11/29/2009 0506   KETONESUR NEGATIVE  11/29/2009 0506   PROTEINUR NEGATIVE 11/29/2009 0506   UROBILINOGEN 0.2 11/29/2009 0506   NITRITE NEGATIVE 11/29/2009 0506   LEUKOCYTESUR  11/29/2009 0506    NEGATIVE MICROSCOPIC NOT DONE ON URINES WITH NEGATIVE PROTEIN, BLOOD, LEUKOCYTES, NITRITE, OR GLUCOSE <1000 mg/dL.    ----------------------------------------------------------------------------------------------------------------     Imaging results:   Ct Abdomen Pelvis Wo Contrast  Result Date: 08/15/2018 CLINICAL DATA:  Painless jaundice, fatigue and abdominal swelling. EXAM: CT ABDOMEN AND PELVIS WITHOUT CONTRAST TECHNIQUE: Multidetector CT imaging of the abdomen and pelvis was performed following the standard protocol without IV contrast. COMPARISON:  06/18/2013 FINDINGS: Lower chest: Small left pleural effusion noted with overlying atelectasis. The right lung base is clear. The heart is normal in size. No pericardial effusion. Marked eventration of the right hemidiaphragm with overlying atelectasis. Hepatobiliary:  Hepatomegaly. The liver contour is irregular and the caudate lobe is prominent. The hepatic fissures are also slightly prominent. Findings consistent with cirrhosis. There is also diffuse heterogeneous fatty infiltration and possible superimposed hepatitis. No obvious hepatic lesion. The gallbladder is distended and demonstrates high attenuation which could be sludge. There also several small calcified gallstones. The gallbladder wall is thickened but this could be due to ascites and low albumin. No intra or extrahepatic biliary dilatation to suggest biliary obstruction. Pancreas: No mass, inflammation or ductal dilatation. Spleen: Splenomegaly. Spleen measures 16 x 16 x 12.5 cm. No worrisome lesions. Adrenals/Urinary Tract: Adrenal glands and kidneys are unremarkable. No renal, ureteral or bladder calculi or obvious mass without contrast. Stomach/Bowel: The stomach, duodenum, small bowel and colon are grossly normal without oral contrast. No acute inflammatory changes, mass lesions or obstructive findings. Vascular/Lymphatic: Scattered atherosclerotic calcifications involving the aorta but no aneurysm. There are numerous borderline upper abdominal lymph nodes typical with cirrhosis. Scattered retroperitoneal lymph nodes are also noted but no mass or overt adenopathy. Reproductive: The prostate gland and seminal vesicles are unremarkable. Other: Small volume abdominal/pelvic ascites. There is also diffuse mesenteric edema and diffuse body wall edema. Musculoskeletal: No significant bony findings. IMPRESSION: 1. CT findings consistent with cirrhosis and fatty infiltration of the liver. No focal hepatic lesions or evidence of biliary obstruction. There could be superimposed hepatitis. 2. Evidence of portal venous hypertension with portal venous collaterals, splenomegaly and ascites. 3. Distended gallbladder with high attenuation material which could suggest sludge. Several gallstones are also noted. Gallbladder wall  thickening likely due to ascites and low albumin. 4. Small left pleural effusion with overlying atelectasis. 5. Diffuse body wall edema. Electronically Signed   By: Rudie MeyerP.  Gallerani M.D.   On: 08/15/2018 11:53   Dg Chest Portable 1 View  Result Date: 08/15/2018 CLINICAL DATA:  Anemia.  Vomiting. EXAM: PORTABLE CHEST 1 VIEW COMPARISON:  January 01, 2018 FINDINGS: Minimal opacity in the lateral left lung base. There is an elevated right hemidiaphragm. There is also opacity in the right lung base. The cardiomediastinal silhouette is normal. IMPRESSION: 1. The opacity in the right lung base may represent a pleural effusion and atelectasis. Infiltrate not excluded. 2. Mild opacity in the lateral left lung base favored represent atelectasis. Subtle infiltrate not excluded. Electronically Signed   By: Gerome Samavid  Williams III M.D   On: 08/15/2018 10:47    My personal review of EKG: Rhythm NSR, Rate 86 bpm with nonspecific intraventricular conduction delay.  No acute changes    Assessment & Plan  Upper GI bleed; GI on consult Drop in hemoglobin noted. Continue with IV Protonix  Acute blood loss anemia Monitor hemoglobin and transfuse as  needed Continue with IV fluids normal saline  Acute hepatitis/alcoholic with element of cirrhosis from history Monitor LFTs Check hepatitis panel  Acute kidney injury Hold hydrochlorothiazide/lisinopril Continue with IV fluids normal saline Monitor creatinine in a.m.  History of anxiety/depression on clonazepam Hold clonazepam due to Ativan as needed  Alcoholism Start CIWA protocol  Low magnesium Repleted  History of hypertension on lisinopril/hydrochlorothiazide Hold medication  DVT Prophylaxis SCDs  AM Labs Ordered, also please review Full Orders    Code Status full  Disposition Plan: Home  Time spent in minutes : 42 minutes  Condition GUARDED   @SIGNATURE @

## 2018-08-15 NOTE — Consult Note (Addendum)
Gastroenterology Consult: 2:14 PM 08/15/2018  LOS: 0 days    Referring Provider: Dr Con MemosHavilland in ED  Primary Care Physician:  Kristian CoveyBurchette, Bruce W, MD Primary Gastroenterologist:  Dr. Leone PayorGessner     Reason for Consultation: Hematemesis, jaundice, cirrhosis.   HPI: Joseph Valenzuela is a 43 y.o. male.  Alcohol abuse.  Obesity.  Hypertension. CT abdomen pelvis in 2015 showed hepatomegaly, fatty liver, splenomegaly. In 2009 hep B surf Ag, HCV Ab negative  04/2007 EGD.  For chest pain.  Dr. Sheryn Bisonavid Patterson.  Hiatal hernia.  Irregular Z line, biopsied, rule out Barrett's.  Pathology showed intestinal metaplasia consistent with Barrett's, no dysplasia, no malignancy. 05/2015 EGD.  For hematemesis and Barrett's surveillance.  Esophageal mucosal changes secondary to establish Barrett's disease, biopsied.  Irregular Z line but does not meet current criteria for Barrett's by endoscopy.  Nonbleeding, transiently seen, prominent esophageal veins, ?varices, noted after biopsy.  Gastritis, could be be source of slight hematemesis, ? EtOH induced.  Gastric pathology showed reactive gastropathy, no H. pylori, intestinal metaplasia or malignancy.  GE junction biopsies showed benign squama glandular mucosa with mild chronic inflammation.  Patient has been drinking heavily for several years.  He goes through 1/2 gallon of vodka every couple of days.  Over the past 2 weeks he has had daily nausea vomiting, trouble keeping things down including his meds.  Mostly has been able to keep his Dexilant dose down on a daily basis.  At times he is seen streaks, at most small clots, of blood in otherwise white foamy emesis.  He has had loose stools for 2 to 4 days, these are red, bloody in color though it is hard to understand whether or not this is red  blood or a burgundy/melenic looking stool.  A couple of weeks or more of abdominal swelling as well as progressive lower extremity edema.  Despite the swelling he says that over 2 years his weight has dropped from 425 to about 275#, weight was 287#at PMD office visit 05/18/2018.  Rarely if ever uses NSAIDs or aspirin products.  Patient got concerned this morning and EMS transported him to hospital because he saw what to him look like blood in his urine a couple of times this morning.  It burns when he urinates.  Up until this morning his urine just looked darker than usual.  He has been progressively dizzy over the last few days.  No syncope.  No history of seizures.  Hgb 10.8, was 14.2 in 01/2018.  MCV 88.  Platelets 170 K. Sodium 120.  Potassium 5.2.  Acute kidney injury: BUN 32/creatinine 2.4.   T bili 28.5.  Alkaline phosphatase 189.  AST/ALT 226/63. Troponin I 0.03. PT/INR 23.9/2.2 BPs 80s-90s/40s-50s.  HR in 60s to 80s.    CTAP without contrast.  Cirrhosis, fatty liver.  Portal venous hypertension with portal venous collaterals, splenomegaly, ascites.  Gallbladder distended with likely sludge as well as several small stones.  Gallbladder wall thickening.  No dilated bile ducts left pleural effusion and overlying atelectasis.  Diffuse body wall  edema.  Patient's wife left him in the fall 2019.  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  [place], brother mostly living with/at his girlfriend's.  FM hx below.    Past Medical History:  Diagnosis Date  . Alcoholism (HCC)   . Anxiety   . Asthma   . Back pain, chronic   . Barrett esophagus   . Benign positional vertigo   . Candidiasis of mouth   . Degenerative spinal arthritis   . Depression with anxiety   . Esophageal reflux   . Fatty liver   . Herniated disc   . Hiatal hernia   . Hypertension   . Irritable bowel syndrome   . Obesity    . Spleen enlarged   . Suicidal ideations   . Vitamin B12 deficiency     Past Surgical History:  Procedure Laterality Date  . TYMPANOSTOMY TUBE PLACEMENT    . UPPER GASTROINTESTINAL ENDOSCOPY      Prior to Admission medications   Medication Sig Start Date End Date Taking? Authorizing Provider  clonazePAM (KLONOPIN) 1 MG tablet Take one by mouth bid Patient taking differently: Take 1 mg by mouth 2 (two) times daily. Take one by mouth bid 05/30/18  Yes Burchette, Elberta FortisBruce W, MD  dexlansoprazole (DEXILANT) 60 MG capsule Take 60 mg by mouth daily.   Yes [provider]  lisinopril-hydrochlorothiazide (PRINZIDE,ZESTORETIC) 20-12.5 MG tablet Take 1 tablet by mouth daily.   Yes [provider]    Scheduled Meds:  Infusions: . pantoprozole (PROTONIX) infusion 8 mg/hr (08/15/18 1013)   PRN Meds:    Allergies as of 08/15/2018  . (No Known Allergies)    Family History  Problem Relation Age of Onset  . Alcohol abuse Father   . Depression Mother   . Diabetes Mother   . Hypertension Brother   . Diabetes Maternal Aunt   . Stomach cancer Maternal Grandmother        great  . Brain cancer Maternal Grandfather   . Colon cancer Neg Hx   . Esophageal cancer Neg Hx     Social History   Socioeconomic History  . Marital status: Married    Spouse name: Not on file  . Number of children: 0  . Years of education: Not on file  . Highest education level: Not on file  Occupational History  . Occupation: IT    CommentSport and exercise psychologist: [employer]  Social Needs  . Financial resource strain: Not on file  . Food insecurity    Worry: Not on file    Inability: Not on file  . Transportation needs    Medical: Not on file    Non-medical: Not on file  Tobacco Use  . Smoking status: Never Smoker  . Smokeless tobacco: Never Used  Substance and Sexual Activity  . Alcohol use: Yes    Alcohol/week: 0.0 standard drinks    Comment: drink twice a week per pt.  . Drug use: No    Types:  Marijuana    Comment: previous hx  . Sexual activity: Not on file  Lifestyle  . Physical activity    Days per week: Not on file    Minutes per session: Not on file  . Stress: Not on file  Relationships  . Social Musicianconnections    Talks on phone: Not on file    Gets together: Not on file    Attends  religious service: Not on file    Active member of club or organization: Not on file    Attends meetings of clubs or organizations: Not on file    Relationship status: Not on file  . Intimate partner violence    Fear of current or ex partner: Not on file    Emotionally abused: Not on file    Physically abused: Not on file    Forced sexual activity: Not on file  Other Topics Concern  . Not on file  Social History Narrative   Married no children, 2 dogs lives in apartment   Part-time forklift driver and part-time Programmer, applicationscomputer repair.    REVIEW OF SYSTEMS: Constitutional: Weakness, feels awful. ENT:  No nose bleeds Pulm: No shortness of breath, denies cough. CV:  No palpitations, no chest pain.  + For lower extremity edema GU:  See HPI GI: See HPI. Heme: Other than the blood in his urine and in his vomit, has not had any unusual bleeding or bruising. Transfusions: Previous blood transfusions. Neuro:  No headaches, no peripheral tingling or numbness.  Feels dizzy but no syncope or presyncope. Derm:  No itching, no rash or sores.  Endocrine:  No sweats or chills.   Immunization: Flu shot in 12/2017. Travel:  None beyond local counties in last few months.    PHYSICAL EXAM: Vital signs in last 24 hours: Vitals:   08/15/18 1350 08/15/18 1400  BP: (!) 102/46 (!) 95/45  Pulse: 67 71  Resp: 19 14  Temp:    SpO2: 94% 98%   Wt Readings from Last 3 Encounters:  08/15/18 130.5 kg  05/18/18 130.5 kg  02/26/18 127.9 kg    General: Obese, nontoxic but unwell WM Head: No facial asymmetry or swelling.  No signs of head trauma.  EOMI. Eyes: Scleral icterus, but not as much as would be  expected given the high level of T bili. Ears: Not hard of hearing Nose: No congestion or discharge. Mouth: Good dentition.  Tongue midline.  Oral mucosa pink, moist, clear.  Mucosa at the back of the throat yellowed consistent with jaundice. Neck: No JVD, no masses, no thyromegaly. Lungs: No labored breathing or cough.  Clear bilaterally. Heart: RRR.  No MRG.  S1, S2 present Abdomen: Obese, soft, protuberant.  Tender across the mid and upper abdomen bilaterally.  Unable to appreciate splenomegaly or hepatomegaly.  No obvious hernias.  No bruits..   Rectal: Yellow-colored, but FOBT + per Dr. Ceasar LundHaviland's DRE, not repeat the exam. Musc/Skeltl: No joint redness, swelling. Extremities: 2+ pitting edema of the feet and lower legs.  Mild erythema in the lower legs. Neurologic: Oriented to year, place, situation, self.  Slow mentation and speech pattern.  Mild to moderate word finding difficulty.  No asterixis, slight hand tremor. Skin: Mild jaundice, less than expected given level of T bili. Tattoos: Rule on his upper body and arms. Nodes: No cervical adenopathy. Psych: Flat, depressed.  Cooperative, slightly anxious.  Intake/Output from previous day: No intake/output data recorded. Intake/Output this shift: Total I/O In: 1150 [IV Piggyback:1150] Out: -   LAB RESULTS: Recent Labs    08/15/18 0915  WBC 8.2  HGB 10.8*  HCT 30.4*  PLT 170   BMET Lab Results  Component Value Date   NA 120 (L) 08/15/2018   NA 138 02/26/2018   NA 136 01/01/2018   K 5.2 (H) 08/15/2018   K 4.2 02/26/2018   K 3.9 01/01/2018   CL 85 (L) 08/15/2018   CL  100 02/26/2018   CL 98 01/01/2018   CO2 18 (L) 08/15/2018   CO2 25 02/26/2018   CO2 26 01/01/2018   GLUCOSE 83 08/15/2018   GLUCOSE 109 (H) 02/26/2018   GLUCOSE 120 (H) 01/01/2018   BUN 32 (H) 08/15/2018   BUN 13 02/26/2018   BUN 7 01/01/2018   CREATININE 2.48 (H) 08/15/2018   CREATININE 0.80 02/26/2018   CREATININE 0.90 01/01/2018   CALCIUM 8.4  (L) 08/15/2018   CALCIUM 9.3 02/26/2018   CALCIUM 9.8 01/01/2018   LFT Recent Labs    08/15/18 0915  PROT 6.4*  ALBUMIN 2.4*  AST 226*  ALT 63*  ALKPHOS 189*  BILITOT 28.5*   PT/INR Lab Results  Component Value Date   INR 2.2 (H) 08/15/2018   INR 1.3 (H) 06/03/2015   INR 1.0 RATIO 05/03/2007   Hepatitis Panel No results for input(s): HEPBSAG, HCVAB, HEPAIGM, HEPBIGM in the last 72 hours. C-Diff No components found for: CDIFF Lipase     Component Value Date/Time   LIPASE 102 (H) 08/15/2018 0915    Drugs of Abuse     Component Value Date/Time   LABOPIA NONE DETECTED 02/26/2018 0606   COCAINSCRNUR NONE DETECTED 02/26/2018 0606   LABBENZ NONE DETECTED 02/26/2018 0606   AMPHETMU NONE DETECTED 02/26/2018 0606   THCU NONE DETECTED 02/26/2018 0606   LABBARB NONE DETECTED 02/26/2018 0606     RADIOLOGY STUDIES: Ct Abdomen Pelvis Wo Contrast  Result Date: 08/15/2018 CLINICAL DATA:  Painless jaundice, fatigue and abdominal swelling. EXAM: CT ABDOMEN AND PELVIS WITHOUT CONTRAST TECHNIQUE: Multidetector CT imaging of the abdomen and pelvis was performed following the standard protocol without IV contrast. COMPARISON:  06/18/2013 FINDINGS: Lower chest: Small left pleural effusion noted with overlying atelectasis. The right lung base is clear. The heart is normal in size. No pericardial effusion. Marked eventration of the right hemidiaphragm with overlying atelectasis. Hepatobiliary: Hepatomegaly. The liver contour is irregular and the caudate lobe is prominent. The hepatic fissures are also slightly prominent. Findings consistent with cirrhosis. There is also diffuse heterogeneous fatty infiltration and possible superimposed hepatitis. No obvious hepatic lesion. The gallbladder is distended and demonstrates high attenuation which could be sludge. There also several small calcified gallstones. The gallbladder wall is thickened but this could be due to ascites and low albumin. No intra  or extrahepatic biliary dilatation to suggest biliary obstruction. Pancreas: No mass, inflammation or ductal dilatation. Spleen: Splenomegaly. Spleen measures 16 x 16 x 12.5 cm. No worrisome lesions. Adrenals/Urinary Tract: Adrenal glands and kidneys are unremarkable. No renal, ureteral or bladder calculi or obvious mass without contrast. Stomach/Bowel: The stomach, duodenum, small bowel and colon are grossly normal without oral contrast. No acute inflammatory changes, mass lesions or obstructive findings. Vascular/Lymphatic: Scattered atherosclerotic calcifications involving the aorta but no aneurysm. There are numerous borderline upper abdominal lymph nodes typical with cirrhosis. Scattered retroperitoneal lymph nodes are also noted but no mass or overt adenopathy. Reproductive: The prostate gland and seminal vesicles are unremarkable. Other: Small volume abdominal/pelvic ascites. There is also diffuse mesenteric edema and diffuse body wall edema. Musculoskeletal: No significant bony findings. IMPRESSION: 1. CT findings consistent with cirrhosis and fatty infiltration of the liver. No focal hepatic lesions or evidence of biliary obstruction. There could be superimposed hepatitis. 2. Evidence of portal venous hypertension with portal venous collaterals, splenomegaly and ascites. 3. Distended gallbladder with high attenuation material which could suggest sludge. Several gallstones are also noted. Gallbladder wall thickening likely due to ascites and low albumin. 4.  Small left pleural effusion with overlying atelectasis. 5. Diffuse body wall edema. Electronically Signed   By: Marijo Sanes M.D.   On: 08/15/2018 11:53   Dg Chest Portable 1 View  Result Date: 08/15/2018 CLINICAL DATA:  Anemia.  Vomiting. EXAM: PORTABLE CHEST 1 VIEW COMPARISON:  January 01, 2018 FINDINGS: Minimal opacity in the lateral left lung base. There is an elevated right hemidiaphragm. There is also opacity in the right lung base. The  cardiomediastinal silhouette is normal. IMPRESSION: 1. The opacity in the right lung base may represent a pleural effusion and atelectasis. Infiltrate not excluded. 2. Mild opacity in the lateral left lung base favored represent atelectasis. Subtle infiltrate not excluded. Electronically Signed   By: Dorise Bullion III M.D   On: 08/15/2018 10:47     IMPRESSION:   *    Cirrhosis.  Alcoholic hepatitis with jaundice. On CT has gallstones, no evidence biliary obstruction, cirrhosis.   *    Hematemesis.  Streaks and small clots of blood mixed in with otherwise clear emesis.  Nausea and vomiting going on for at least 2 weeks.  Hx Barrets esophagus.  Compliant with daily Dexilant but also drinking significant amounts of vodka 750 mL's/day.   Non melenic FOBT + stool on DRE today.  R/o etoh gastritis, bleeding from portal hypertensive gastritis.  Given emesis is clear with inclusions of blood, less worried about bleeding from esophageal varices and MWT.     *  Chronic alcoholism  *   Blood loss anemia, MCV WNL  *   Splenomegaly.  Platelets not diminished  *   Coagulopathy.    *   Portal venous hypertension per CTAP.    *   AKI  *   Hyponatremia  *   Hyperkalemia.    *   Patient reported hematuria.  Wonder if this is actually bilirubin that he saw rather than actual blood?  *   Depression, anxiety.    RECOMMENDATIONS:     *  Set for EGD mid day tmrw.  Clears now.   Continue Protonix, but just 40 IV BID, not the drip.    *   U/A as ordered, needs collected.    *   Reglan 10 mg IV q 6 hours x 4 doses.    *   CIWA, prn ativan in place for etoh withdrawal.    *   BMET, Hepatic fx panel, PT/INR in AM  *   Vitamin K IV now.     Azucena Freed  08/15/2018, 2:14 PM Phone [phone]      Attending Physician Note   I have taken a history, examined the patient and reviewed the chart. I agree with the Advanced Practitioner's note, impression and recommendations.  Impression:  Severe alcoholic hepatitis with portal hypertension, likely underlying cirrhosis. Total bilirubin 28 and INR 2.2. Discriminant function 69. Guarded prognosis.  Alcoholism Hematemesis, heme + stool. R/O esophagitis, ulcer, varices, PHG Hyponatremia Hyperkalemia AKI  Recommendations: CIWA Vit K, repeat PT/INR Correct Na, K  IV hydration Trend CBC, CMP, PT/INR  IV PPI EGD tomorrow after Vit K, hydration, attempt at Na, K correction Start Prednisolone tomorrow after above treatment and observation overnight  Lucio Edward, MD [facility]

## 2018-08-15 NOTE — ED Notes (Signed)
Date and time results received: 08/15/18 1033 (use smartphrase ".now" to insert current time)  Test: total bilirubin  Critical Value: 28.5  Name of Provider Notified: Gilford Raid, md   Orders Received? Or Actions Taken?: .

## 2018-08-15 NOTE — ED Notes (Signed)
Spoke to pt about performing orthostatic vitals. Pt stated, "I can't get up. I am really dizzy". Unable to perform orthostatics at this time.

## 2018-08-16 ENCOUNTER — Encounter (HOSPITAL_COMMUNITY): Payer: Self-pay | Admitting: Certified Registered Nurse Anesthetist

## 2018-08-16 ENCOUNTER — Inpatient Hospital Stay (HOSPITAL_COMMUNITY): Payer: Medicaid Other

## 2018-08-16 ENCOUNTER — Encounter (HOSPITAL_COMMUNITY)
Admission: EM | Disposition: A | Discharge status: Hospice | Payer: Self-pay | Source: Home / Self Care | Attending: Internal Medicine

## 2018-08-16 ENCOUNTER — Inpatient Hospital Stay (HOSPITAL_COMMUNITY): Payer: Medicaid Other | Admitting: Certified Registered Nurse Anesthetist

## 2018-08-16 DIAGNOSIS — K7200 Acute and subacute hepatic failure without coma: Secondary | ICD-10-CM

## 2018-08-16 DIAGNOSIS — I8501 Esophageal varices with bleeding: Secondary | ICD-10-CM

## 2018-08-16 DIAGNOSIS — E871 Hypo-osmolality and hyponatremia: Secondary | ICD-10-CM | POA: Diagnosis present

## 2018-08-16 DIAGNOSIS — I959 Hypotension, unspecified: Secondary | ICD-10-CM

## 2018-08-16 DIAGNOSIS — R7401 Elevation of levels of liver transaminase levels: Secondary | ICD-10-CM | POA: Diagnosis present

## 2018-08-16 DIAGNOSIS — R188 Other ascites: Secondary | ICD-10-CM

## 2018-08-16 DIAGNOSIS — K920 Hematemesis: Secondary | ICD-10-CM

## 2018-08-16 HISTORY — PX: ESOPHAGOGASTRODUODENOSCOPY (EGD) WITH PROPOFOL: SHX5813

## 2018-08-16 HISTORY — PX: ESOPHAGEAL BANDING: SHX5518

## 2018-08-16 LAB — PROTIME-INR
INR: 2.5 — ABNORMAL HIGH (ref 0.8–1.2)
Prothrombin Time: 26.5 seconds — ABNORMAL HIGH (ref 11.4–15.2)

## 2018-08-16 LAB — HEPATIC FUNCTION PANEL
ALT: 60 U/L — ABNORMAL HIGH (ref 0–44)
AST: 215 U/L — ABNORMAL HIGH (ref 15–41)
Albumin: 2.2 g/dL — ABNORMAL LOW (ref 3.5–5.0)
Alkaline Phosphatase: 168 U/L — ABNORMAL HIGH (ref 38–126)
Bilirubin, Direct: 20.2 mg/dL — ABNORMAL HIGH (ref 0.0–0.2)
Total Bilirubin: 30.3 mg/dL (ref 0.3–1.2)
Total Protein: 6 g/dL — ABNORMAL LOW (ref 6.5–8.1)

## 2018-08-16 LAB — CBC
HCT: 27.7 % — ABNORMAL LOW (ref 39.0–52.0)
Hemoglobin: 10.1 g/dL — ABNORMAL LOW (ref 13.0–17.0)
MCH: 32.2 pg (ref 26.0–34.0)
MCHC: 36.5 g/dL — ABNORMAL HIGH (ref 30.0–36.0)
MCV: 88.2 fL (ref 80.0–100.0)
Platelets: 140 10*3/uL — ABNORMAL LOW (ref 150–400)
RBC: 3.14 MIL/uL — ABNORMAL LOW (ref 4.22–5.81)
RDW: 18.9 % — ABNORMAL HIGH (ref 11.5–15.5)
WBC: 5.9 10*3/uL (ref 4.0–10.5)
nRBC: 0 % (ref 0.0–0.2)

## 2018-08-16 LAB — HIV ANTIBODY (ROUTINE TESTING W REFLEX): HIV Screen 4th Generation wRfx: NONREACTIVE

## 2018-08-16 LAB — BASIC METABOLIC PANEL
Anion gap: 13 (ref 5–15)
BUN: 37 mg/dL — ABNORMAL HIGH (ref 6–20)
CO2: 20 mmol/L — ABNORMAL LOW (ref 22–32)
Calcium: 7.9 mg/dL — ABNORMAL LOW (ref 8.9–10.3)
Chloride: 90 mmol/L — ABNORMAL LOW (ref 98–111)
Creatinine, Ser: 2.78 mg/dL — ABNORMAL HIGH (ref 0.61–1.24)
GFR calc Af Amer: 31 mL/min — ABNORMAL LOW (ref >60)
GFR calc non Af Amer: 27 mL/min — ABNORMAL LOW (ref >60)
Glucose, Bld: 89 mg/dL (ref 70–99)
Potassium: 5.1 mmol/L (ref 3.5–5.1)
Sodium: 123 mmol/L — ABNORMAL LOW (ref 135–145)

## 2018-08-16 LAB — NOVEL CORONAVIRUS, NAA (HOSP ORDER, SEND-OUT TO REF LAB; TAT 18-24 HRS): SARS-CoV-2, NAA: NOT DETECTED

## 2018-08-16 SURGERY — ESOPHAGOGASTRODUODENOSCOPY (EGD) WITH PROPOFOL
Anesthesia: Monitor Anesthesia Care

## 2018-08-16 MED ORDER — PREDNISOLONE 5 MG PO TABS
40.0000 mg | ORAL_TABLET | Freq: Every day | ORAL | Status: DC
  Administered 2018-08-16 – 2018-08-22 (×7): 40 mg via ORAL
  Filled 2018-08-16 (×7): qty 8

## 2018-08-16 MED ORDER — PHENYLEPHRINE 40 MCG/ML (10ML) SYRINGE FOR IV PUSH (FOR BLOOD PRESSURE SUPPORT)
PREFILLED_SYRINGE | INTRAVENOUS | Status: DC | PRN
  Administered 2018-08-16 (×9): 80 ug via INTRAVENOUS

## 2018-08-16 MED ORDER — MAGIC MOUTHWASH
10.0000 mL | Freq: Four times a day (QID) | ORAL | Status: DC
  Administered 2018-08-17 – 2018-08-22 (×21): 10 mL via ORAL
  Filled 2018-08-16 (×42): qty 10

## 2018-08-16 MED ORDER — STERILE WATER FOR INJECTION IV SOLN
INTRAVENOUS | Status: DC
  Administered 2018-08-16 – 2018-08-17 (×2): via INTRAVENOUS
  Filled 2018-08-16 (×4): qty 850

## 2018-08-16 MED ORDER — HYDROMORPHONE HCL 1 MG/ML IJ SOLN: 1.0000 mg | INTRAMUSCULAR | Status: DC | PRN

## 2018-08-16 MED ORDER — ALBUMIN HUMAN 5 % IV SOLN
INTRAVENOUS | Status: AC
  Filled 2018-08-16: qty 250

## 2018-08-16 MED ORDER — ALBUMIN HUMAN 5 % IV SOLN
INTRAVENOUS | Status: DC | PRN
  Administered 2018-08-16: 14:00:00 via INTRAVENOUS

## 2018-08-16 MED ORDER — MIDODRINE HCL 5 MG PO TABS
10.0000 mg | ORAL_TABLET | Freq: Three times a day (TID) | ORAL | Status: DC
  Administered 2018-08-16 – 2018-08-25 (×26): 10 mg via ORAL
  Filled 2018-08-16 (×25): qty 2

## 2018-08-16 MED ORDER — OCTREOTIDE ACETATE 100 MCG/ML IJ SOLN: 50.0000 ug | INTRAMUSCULAR | Status: DC

## 2018-08-16 MED ORDER — PROPOFOL 500 MG/50ML IV EMUL
INTRAVENOUS | Status: DC | PRN
  Administered 2018-08-16: 50 ug/kg/min via INTRAVENOUS

## 2018-08-16 MED ORDER — VITAMIN K1 10 MG/ML IJ SOLN
5.0000 mg | Freq: Once | INTRAVENOUS | Status: AC
  Administered 2018-08-16: 17:00:00 5 mg via INTRAVENOUS
  Filled 2018-08-16: qty 0.5

## 2018-08-16 MED ORDER — PROPOFOL 10 MG/ML IV BOLUS
INTRAVENOUS | Status: DC | PRN
  Administered 2018-08-16 (×2): 20 mg via INTRAVENOUS
  Administered 2018-08-16: 40 mg via INTRAVENOUS
  Administered 2018-08-16 (×2): 20 mg via INTRAVENOUS

## 2018-08-16 MED ORDER — OCTREOTIDE ACETATE 100 MCG/ML IJ SOLN
100.0000 ug | Freq: Every day | INTRAMUSCULAR | Status: DC
  Filled 2018-08-16: qty 1

## 2018-08-16 MED ORDER — ALBUMIN HUMAN 25 % IV SOLN
12.5000 g | Freq: Three times a day (TID) | INTRAVENOUS | Status: DC
  Administered 2018-08-16 – 2018-08-20 (×13): 12.5 g via INTRAVENOUS
  Filled 2018-08-16 (×13): qty 50

## 2018-08-16 MED ORDER — IPRATROPIUM-ALBUTEROL 0.5-2.5 (3) MG/3ML IN SOLN
3.0000 mL | Freq: Two times a day (BID) | RESPIRATORY_TRACT | Status: DC
  Administered 2018-08-16: 3 mL via RESPIRATORY_TRACT
  Filled 2018-08-16 (×2): qty 3

## 2018-08-16 MED ORDER — SODIUM CHLORIDE 0.9% IV SOLUTION
Freq: Once | INTRAVENOUS | Status: AC
  Administered 2018-08-16: 12:00:00 via INTRAVENOUS

## 2018-08-16 MED ORDER — FENTANYL CITRATE (PF) 100 MCG/2ML IJ SOLN
25.0000 ug | INTRAMUSCULAR | Status: DC | PRN
  Administered 2018-08-16: 21:00:00 25 ug via INTRAVENOUS
  Administered 2018-08-17 – 2018-08-25 (×18): 50 ug via INTRAVENOUS
  Filled 2018-08-16 (×20): qty 2

## 2018-08-16 MED ORDER — SODIUM CHLORIDE 0.9 % IV SOLN
50.0000 ug/h | INTRAVENOUS | Status: DC
  Administered 2018-08-16 – 2018-08-17 (×2): 50 ug/h via INTRAVENOUS
  Filled 2018-08-16 (×4): qty 1

## 2018-08-16 SURGICAL SUPPLY — 15 items

## 2018-08-16 NOTE — Anesthesia Postprocedure Evaluation (Signed)
Anesthesia Post Note  Patient: Lillard Anes Nazari  Procedure(s) Performed: ESOPHAGOGASTRODUODENOSCOPY (EGD) WITH PROPOFOL (N/A ) ESOPHAGEAL BANDING     Patient location during evaluation: Endoscopy Anesthesia Type: MAC Level of consciousness: awake and alert Pain management: pain level controlled Vital Signs Assessment: post-procedure vital signs reviewed and stable Respiratory status: spontaneous breathing, nonlabored ventilation and respiratory function stable Cardiovascular status: stable and blood pressure returned to baseline Postop Assessment: no apparent nausea or vomiting Anesthetic complications: no    Last Vitals:  Vitals:   08/16/18 1348 08/16/18 1450  BP: (!) 102/43 (!) 95/25  Pulse: 81 83  Resp: 19 (!) 22  Temp: 36.9 C 37.2 C  SpO2: 98% 93%    Last Pain:  Vitals:   08/16/18 1450  TempSrc: Oral  PainSc: 0-No pain                 Lynda Rainwater

## 2018-08-16 NOTE — Progress Notes (Addendum)
[facility]                                                                              Patient Demographics  Joseph Valenzuela, is a 43 y.o. male, DOB - [birth year], ZOX:[id number]  Admit date - 08/15/2018   Admitting Physician Carron Curie, MD  Outpatient Primary MD for the patient is Kristian Covey, MD  Outpatient specialists:   LOS - 1  days   Medical records reviewed and are as summarized below:    Chief Complaint  Patient presents with  . Diarrhea  . Jaundice       Brief summary   Patient is a 43 year old male with hypertension, depression, Barrett's esophagus, alcoholism presented with 2-week history of nausea, vomiting, hematemesis, right upper quadrant abdominal pain.  Patient has been drinking a lot, fifth almost every day.   In ED, patient was noted to be jaundiced, sodium 120, potassium 5.2, creatinine 2.48, hemoglobin 10.8.  Elevated LFTs with total bili of 28.5. GI was consulted. COVID-19 test negative  Assessment & Plan    Principal problem Acute upper GI bleed, acute blood loss anemia -Patient has a history of GERD, Barrett's esophagus, possible EtOH gastritis or portal hypertensive gastritis.  FOBT positive. -GI consulted, continue IV PPI, plan for EGD today  ADDENDUM: 3:25PM Received message from Dr. 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 Recommended transfer to ICU for ongoing care.  CCM consulted, discussed with Dr. Craige Cotta  Active Problems: Acute alcoholic hepatitis, transaminitis with hyperbilirubinemia -Patient has been drinking heavily, states fifth of hard liquor almost every day -CT abdomen and pelvis showed cirrhosis, fatty infiltration of the liver, no focal hepatic lesions  or evidence of biliary obstruction, could be superimposed hepatitis.  Evidence of portal venous hypertension with portal venous collateral splenomegaly and ascites.  Distended gallbladder, suggests sludge, several gallstones. -Follow acute hepatitis panel -Follow right upper quadrant ultrasound, will plan paracentesis if ascites, r/o SBP -GI following, started on prednisolone -INR 2.5, recommended vitamin K, FFP   Acute kidney injury: ?  Hepatorenal syndrome -Creatinine 2.4 at the time of admission, in 01/2018, creatinine was 0.8.  Despite IV fluids, worsening. -Hold lisinopril HCTZ -Nephrology consulted, discussed with Dr. Darrick Penna  Hyponatremia with lactic acidosis Sodium 120 at the time of admission with hyperkalemia, patient was placed on IV fluids Sodium slightly better 123, alert and oriented.  History of alcohol abuse -Heavy drinker, placed on CIWA scale with Ativan, currently not in withdrawals -Continue thiamine, folate, MVI  Hypotension -BP soft,  on IV fluids -Hold lisinopril HCTZ -Will likely need midodrine  Code Status: Full CODE STATUS DVT Prophylaxis:   SCD's Family Communication: Discussed in detail with the patient, all imaging results, lab results explained to the patient    Disposition Plan: Continue stepdown status with high mortality, acute  alcoholic hepatitis, upper GI bleed, hypotensive, likely hepatorenal syndrome  Time Spent in minutes   45 minutes  Procedures:  None  Consultants:   Gastroenterology Nephrology  Antimicrobials:   Anti-infectives (From admission, onward)   None          Medications  Scheduled Meds: . sodium chloride   Intravenous Once  . folic acid  1 mg Oral Daily  . ipratropium-albuterol  3 mL Nebulization BID  . metoCLOPramide (REGLAN) injection  10 mg Intravenous Q6H  . multivitamin with minerals  1 tablet Oral Daily  . pantoprazole (PROTONIX) IV  40 mg Intravenous Q12H  . prednisoLONE  40 mg Oral Daily  .  thiamine  100 mg Oral Daily   Or  . thiamine  100 mg Intravenous Daily   Continuous Infusions: . sodium chloride 100 mL/hr at 08/16/18 0417  . phytonadione (VITAMIN K) IV     PRN Meds:.HYDROmorphone (DILAUDID) injection, LORazepam **OR** LORazepam, ondansetron **OR** ondansetron (ZOFRAN) IV      Subjective:   Joseph Valenzuela was seen and examined today.  Complaining of pain in the right upper quadrant, holding the side of his abdomen.  No nausea or vomiting, pain in the epigastric and right upper quadrant 8/10.  Patient denies any dizziness, chest pain, new weakness, numbess, tingling. No acute events overnight.  No fevers  Objective:   Vitals:   08/16/18 0438 08/16/18 0759 08/16/18 0851 08/16/18 0903  BP:   (!) 89/42   Pulse:   80   Resp:   (!) 22   Temp: 97.9 F (36.6 C)   (!) 97.3 F (36.3 C)  TempSrc: Oral   Oral  SpO2:  98% 94%   Weight:      Height:        Intake/Output Summary (Last 24 hours) at 08/16/2018 1146 Last data filed at 08/16/2018 0600 Gross per 24 hour  Intake 2223.09 ml  Output 200 ml  Net 2023.09 ml     Wt Readings from Last 3 Encounters:  08/15/18 130.5 kg  05/18/18 130.5 kg  02/26/18 127.9 kg     Exam  General: Alert and oriented x 3, NAD, jaundiced  Eyes: PERRLA, EOMI, icteric Sclera,  HEENT:    Cardiovascular: S1 S2 auscultated, Regular rate and rhythm.  Respiratory: Clear to auscultation bilaterally, no wheezing, rales or rhonchi  Gastrointestinal: Soft, RUQ TTP, distended, + bowel sounds  Ext: +1 pedal edema bilaterally  Neuro: No new deficits  Musculoskeletal: No digital cyanosis, clubbing  Skin: Petechiae on the lower legs  Psych: Normal affect and demeanor, alert and oriented x3    Data Reviewed:  I have personally reviewed following labs and imaging studies  Micro Results Recent Results (from the past 240 hour(s))  Novel Coronavirus,NAA,(SEND-OUT TO REF LAB - TAT 24-48 hrs); Hosp Order     Status: None    Collection Time: 08/15/18 10:31 AM   Specimen: Nasopharyngeal Swab; Respiratory  Result Value Ref Range Status   SARS-CoV-2, NAA NOT DETECTED NOT DETECTED Final    Comment: (NOTE) This test was developed and its performance characteristics determined by [facility]. This test has not been FDA cleared or approved. This test has been authorized by FDA under an Emergency Use Authorization (EUA). This test is only authorized for the duration of time the declaration that circumstances exist justifying the authorization of the emergency use of in vitro diagnostic tests for detection of SARS-CoV-2 virus and/or diagnosis of COVID-19 infection under section 564(b)(1) of the Act, 21 U.S.C. 086VHQ-4(O)(9360bbb-3(b)(1), unless the authorization is terminated or revoked sooner. When diagnostic testing is negative, the possibility of a false  negative result should be considered in the context of a patient's recent exposures and the presence of clinical signs and symptoms consistent with COVID-19. An individual without symptoms of COVID-19 and who is not shedding SARS-CoV-2 virus would expect to have a negative (not detected) result in this assay. Performed  At: [facility] [address] [place], [place] [id number] Jolene SchimkeNagendra Sanjai MD WU:[id number]:[phone]    Coronavirus Source NASOPHARYNGEAL  Final    Comment: Performed at [facility], [address] [place], [place] [address]  SARS Coronavirus 2     Status: None   Collection Time: 08/15/18  2:19 PM  Result Value Ref Range Status   SARS Coronavirus 2 NOT DETECTED NOT DETECTED Final    Comment: (NOTE) SARS-CoV-2 target nucleic acids are NOT DETECTED. The SARS-CoV-2 RNA is generally detectable in upper and lower respiratory specimens during the acute phase of infection.  Negative  results do not preclude SARS-CoV-2 infection, do not rule out co-infections with other pathogens, and should not be used as the sole basis for treatment or other  patient management decisions.  Negative results must be combined with clinical observations, patient history, and epidemiological information. The expected result is Not Detected. Fact Sheet for Patients: http://www.biofiredefense.com/wp-content/uploads/2020/03/BIOFIRE-COVID -19-patients.pdf Fact Sheet for Healthcare Providers: http://www.biofiredefense.com/wp-content/uploads/2020/03/BIOFIRE-COVID -19-hcp.pdf This test is not yet approved or cleared by the [place] FDA and  has been authorized for detection and/or diagnosis of SARS-CoV-2 by FDA under an Emergency Use Authorization (EUA).  This EUA will remain in effec t (meaning this test can be used) for the duration of  the COVID-19 declaration under Section 564(b)(1) of the Act, 21 U.S.C. section 360bbb-3(b)(1), unless the authorization is terminated or revoked sooner. Performed at [facility], [address] [place], [place] [address]     Radiology Reports Ct Abdomen Pelvis Wo Contrast  Result Date: 08/15/2018 CLINICAL DATA:  Painless jaundice, fatigue and abdominal swelling. EXAM: CT ABDOMEN AND PELVIS WITHOUT CONTRAST TECHNIQUE: Multidetector CT imaging of the abdomen and pelvis was performed following the standard protocol without IV contrast. COMPARISON:  06/18/2013 FINDINGS: Lower chest: Small left pleural effusion noted with overlying atelectasis. The right lung base is clear. The heart is normal in size. No pericardial effusion. Marked eventration of the right hemidiaphragm with overlying atelectasis. Hepatobiliary: Hepatomegaly. The liver contour is irregular and the caudate lobe is prominent. The hepatic fissures are also slightly prominent. Findings consistent with cirrhosis. There is also diffuse heterogeneous fatty infiltration and possible superimposed hepatitis. No obvious hepatic lesion. The gallbladder is distended and demonstrates high attenuation which could be sludge. There also several small calcified  gallstones. The gallbladder wall is thickened but this could be due to ascites and low albumin. No intra or extrahepatic biliary dilatation to suggest biliary obstruction. Pancreas: No mass, inflammation or ductal dilatation. Spleen: Splenomegaly. Spleen measures 16 x 16 x 12.5 cm. No worrisome lesions. Adrenals/Urinary Tract: Adrenal glands and kidneys are unremarkable. No renal, ureteral or bladder calculi or obvious mass without contrast. Stomach/Bowel: The stomach, duodenum, small bowel and colon are grossly normal without oral contrast. No acute inflammatory changes, mass lesions or obstructive findings. Vascular/Lymphatic: Scattered atherosclerotic calcifications involving the aorta but no aneurysm. There are numerous borderline upper abdominal lymph nodes typical with cirrhosis. Scattered retroperitoneal lymph nodes are also noted but no mass or overt adenopathy. Reproductive: The prostate gland and seminal vesicles are unremarkable. Other: Small volume abdominal/pelvic ascites. There is also diffuse mesenteric edema and diffuse body wall edema. Musculoskeletal: No significant  bony findings. IMPRESSION: 1. CT findings consistent with cirrhosis and fatty infiltration of the liver. No focal hepatic lesions or evidence of biliary obstruction. There could be superimposed hepatitis. 2. Evidence of portal venous hypertension with portal venous collaterals, splenomegaly and ascites. 3. Distended gallbladder with high attenuation material which could suggest sludge. Several gallstones are also noted. Gallbladder wall thickening likely due to ascites and low albumin. 4. Small left pleural effusion with overlying atelectasis. 5. Diffuse body wall edema. Electronically Signed   By: Rudie MeyerP.  Gallerani M.D.   On: 08/15/2018 11:53   Dg Chest Portable 1 View  Result Date: 08/15/2018 CLINICAL DATA:  Anemia.  Vomiting. EXAM: PORTABLE CHEST 1 VIEW COMPARISON:  January 01, 2018 FINDINGS: Minimal opacity in the lateral left lung  base. There is an elevated right hemidiaphragm. There is also opacity in the right lung base. The cardiomediastinal silhouette is normal. IMPRESSION: 1. The opacity in the right lung base may represent a pleural effusion and atelectasis. Infiltrate not excluded. 2. Mild opacity in the lateral left lung base favored represent atelectasis. Subtle infiltrate not excluded. Electronically Signed   By: Gerome Samavid  Williams III M.D   On: 08/15/2018 10:47    Lab Data:  CBC: Recent Labs  Lab 08/15/18 0915 08/15/18 2033 08/16/18 0324  WBC 8.2 6.4 5.9  NEUTROABS 6.1 4.9  --   HGB 10.8* 10.5* 10.1*  HCT 30.4* 28.7* 27.7*  MCV 88.6 87.8 88.2  PLT 170 133* 140*   Basic Metabolic Panel: Recent Labs  Lab 08/15/18 0915 08/15/18 0930 08/16/18 0324  NA 120*  --  123*  K 5.2*  --  5.1  CL 85*  --  90*  CO2 18*  --  20*  GLUCOSE 83  --  89  BUN 32*  --  37*  CREATININE 2.48*  --  2.78*  CALCIUM 8.4*  --  7.9*  MG  --  1.6*  --    GFR: Estimated Creatinine Clearance: 46.8 mL/min (A) (by C-G formula based on SCr of 2.78 mg/dL (H)). Liver Function Tests: Recent Labs  Lab 08/15/18 0915 08/16/18 0324  AST 226* 215*  ALT 63* 60*  ALKPHOS 189* 168*  BILITOT 28.5* 30.3*  PROT 6.4* 6.0*  ALBUMIN 2.4* 2.2*   Recent Labs  Lab 08/15/18 0915  LIPASE 102*   Recent Labs  Lab 08/15/18 0915  AMMONIA 49*   Coagulation Profile: Recent Labs  Lab 08/15/18 0915 08/16/18 0324  INR 2.2* 2.5*   Cardiac Enzymes: Recent Labs  Lab 08/15/18 0915  TROPONINI <0.03   BNP (last 3 results) No results for input(s): PROBNP in the last 8760 hours. HbA1C: No results for input(s): HGBA1C in the last 72 hours. CBG: No results for input(s): GLUCAP in the last 168 hours. Lipid Profile: No results for input(s): CHOL, HDL, LDLCALC, TRIG, CHOLHDL, LDLDIRECT in the last 72 hours. Thyroid Function Tests: No results for input(s): TSH, T4TOTAL, FREET4, T3FREE, THYROIDAB in the last 72 hours. Anemia Panel: No  results for input(s): VITAMINB12, FOLATE, FERRITIN, TIBC, IRON, RETICCTPCT in the last 72 hours. Urine analysis:    Component Value Date/Time   COLORURINE AMBER (A) 08/15/2018 2212   APPEARANCEUR CLOUDY (A) 08/15/2018 2212   LABSPEC 1.017 08/15/2018 2212   PHURINE 5.0 08/15/2018 2212   GLUCOSEU 50 (A) 08/15/2018 2212   HGBUR LARGE (A) 08/15/2018 2212   BILIRUBINUR MODERATE (A) 08/15/2018 2212   KETONESUR 5 (A) 08/15/2018 2212   PROTEINUR 100 (A) 08/15/2018 2212   UROBILINOGEN 0.2 11/29/2009  0506   NITRITE NEGATIVE 08/15/2018 2212   LEUKOCYTESUR NEGATIVE 08/15/2018 2212     Anthoney Sheppard M.D. [facility] 08/16/2018, 11:46 AM  Pager: [phone] Between 7am to 7pm - call Pager - [phone]  After 7pm go to [email or web address] - password [email or web address]  Call night coverage person covering after 7pm

## 2018-08-16 NOTE — Progress Notes (Signed)
Fuller Plan MD made aware of pt continued low BP, states that he will contact hospitalist. CRNA and anesthesiologist made aware and hanging drip for BP. Pt requesting ice chips, Stark MD advises this is fine, pt given ice chips. Will continue to monitor.

## 2018-08-16 NOTE — Op Note (Signed)
[facility] Patient Name: Joseph RileBobby Valenzuela Procedure Date : 08/16/2018 MRN: [id number] Attending MD: Meryl DareMalcolm T Henli Hey , MD Date of Birth: 06/04/1975 CSN: [id number] Age: 43 Admit Type: Inpatient Procedure:                Upper GI endoscopy Indications:              Hematemesis Providers:                Venita LickMalcolm T. Russella DarStark, MD, Margaree MackintoshHayleigh Westmoreland, RN,                            Brion AlimentShayla Proctor, Technician Referring MD:             [facility] Medicines:                Monitored Anesthesia Care Complications:            No immediate complications. Estimated Blood Loss:     Estimated blood loss was minimal. Procedure:                Pre-Anesthesia Assessment:                           - Prior to the procedure, a History and Physical                            was performed, and patient medications and                            allergies were reviewed. The patient's tolerance of                            previous anesthesia was also reviewed. The risks                            and benefits of the procedure and the sedation                            options and risks were discussed with the patient.                            All questions were answered, and informed consent                            was obtained. Prior Anticoagulants: The patient has                            taken no previous anticoagulant or antiplatelet                            agents. ASA Grade Assessment: III - A patient with                            severe systemic disease. After reviewing the risks  and benefits, the patient was deemed in                            satisfactory condition to undergo the procedure.                           After obtaining informed consent, the endoscope was                            passed under direct vision. Throughout the                            procedure, the patient's blood pressure, pulse, and   oxygen saturations were monitored continuously. The                            [id number]-[id number] ([id number] Olympus gastroscope was                            introduced through the mouth, and advanced to the                            second part of duodenum. The upper GI endoscopy was                            accomplished without difficulty. The patient                            tolerated the procedure well. Scope In: Scope Out: Findings:      Grade II varices were found in the distal esophagus with mild bleeding.       They were 6 mm in largest diameter. Five bands were successfully placed       with complete eradication, resulting in deflation of varices. Bleeding       had stopped at the end of the procedure.      Patchy, white plaques were found in the entire esophagus. Biopsies not       performed due to coagulopathy.      The exam of the esophagus was otherwise normal.      Severe portal hypertensive gastropathy was found in the entire examined       stomach.      The exam of the stomach was otherwise normal.      The duodenal bulb and second portion of the duodenum were normal. Impression:               - Grade II esophageal varices. Completely                            eradicated. Banded.                           - Esophageal plaques were found, consistent with                            candidiasis.                           -  Portal hypertensive gastropathy.                           - Normal duodenal bulb and second portion of the                            duodenum.                           - No specimens collected. Recommendation:           - Return patient to hospital ward for ongoing care.                           - Clear liquid diet today.                           - Continue present medications.                           - Octreotide IV.                           - Magic mouthwash 10 mL po swish and swallow qid                            for 7 days Procedure Code(s):         --- Professional ---                           [id number], Esophagogastroduodenoscopy, flexible,                            transoral; with band ligation of esophageal/gastric                            varices Diagnosis Code(s):        --- Professional ---                           I85.00, Esophageal varices without bleeding                           K22.9, Disease of esophagus, unspecified                           K76.6, Portal hypertension                           K31.89, Other diseases of stomach and duodenum                           K92.0, Hematemesis CPT copyright 2019 American Medical Association. All rights reserved. The codes documented in this report are preliminary and upon coder review may  be revised to meet current compliance requirements. Ladene Artist, MD 08/16/2018 2:54:50 PM This report has been signed electronically. Number of Addenda: 0

## 2018-08-16 NOTE — Anesthesia Preprocedure Evaluation (Signed)
Anesthesia Evaluation  Patient identified by MRN, date of birth, ID band Patient awake    Reviewed: Allergy & Precautions, NPO status , Patient's Chart, lab work & pertinent test results  Airway Mallampati: II  TM Distance: >3 FB Neck ROM: Full    Dental no notable dental hx.    Pulmonary asthma ,    Pulmonary exam normal breath sounds clear to auscultation       Cardiovascular hypertension, negative cardio ROS Normal cardiovascular exam Rhythm:Regular Rate:Normal     Neuro/Psych Anxiety Depression negative neurological ROS  negative psych ROS   GI/Hepatic GERD  ,(+) Cirrhosis     substance abuse  alcohol use,   Endo/Other  negative endocrine ROS  Renal/GU negative Renal ROS  negative genitourinary   Musculoskeletal  (+) Arthritis , Osteoarthritis,    Abdominal (+) + obese,   Peds negative pediatric ROS (+)  Hematology negative hematology ROS (+)   Anesthesia Other Findings   Reproductive/Obstetrics negative OB ROS                             Anesthesia Physical Anesthesia Plan  ASA: III  Anesthesia Plan: MAC   Post-op Pain Management:    Induction: Intravenous  PONV Risk Score and Plan: 1 and Ondansetron and Treatment may vary due to age or medical condition  Airway Management Planned: Nasal Cannula  Additional Equipment:   Intra-op Plan:   Post-operative Plan:   Informed Consent: I have reviewed the patients History and Physical, chart, labs and discussed the procedure including the risks, benefits and alternatives for the proposed anesthesia with the patient or authorized representative who has indicated his/her understanding and acceptance.     Dental advisory given  Plan Discussed with: CRNA  Anesthesia Plan Comments:         Anesthesia Quick Evaluation

## 2018-08-16 NOTE — Progress Notes (Addendum)
Daily Rounding Note  08/16/2018, 8:42 AM  LOS: 1 day   SUBJECTIVE:   Chief complaint: alcoholic hepatitis.  Limited hematemesis.      Generally feels bad  No further N/V.  Last BM was yest AM PTA.   Now with pain in RUQ, not triggered by movement of torso.  Not much urine output.    Contd soft BPs,in 80s.40s, receiving periodic NS bolus.  No tachycardia.      OBJECTIVE:         Vital signs in last 24 hours:    Temp:  [97.9 F (36.6 C)-98.3 F (36.8 C)] 97.9 F (36.6 C) (06/18 0438) Pulse Rate:  [61-83] 74 (06/18 0330) Resp:  [13-21] 19 (06/18 0330) BP: (82-114)/(38-89) 96/44 (06/18 0330) SpO2:  [93 %-100 %] 98 % (06/18 0759) Weight:  [130.5 kg] 130.5 kg (06/17 1237) Last BM Date: 08/15/18 Filed Weights   08/15/18 1237  Weight: 130.5 kg   General: Jaundiced, unwell, alert.     Heart: RRR Chest: clear bil.  No labored breathing Abdomen: obese, distended, moderately soft, RUQ tender without G/R.  Active BS  Extremities: + LE edema and petechail patterned redness in lower legs Neuro/Psych:  Oriented x 3.  No tremor or asterixis.  Speech and cognition still slow but improved from yesterday.    Intake/Output from previous day: 06/17 0701 - 06/18 0700 In: 3273.1 [P.O.:120; I.V.:1447; IV Piggyback:1706.1] Out: 200 [Urine:200]  Intake/Output this shift: No intake/output data recorded.  Lab Results: Recent Labs    08/15/18 0915 08/15/18 2033 08/16/18 0324  WBC 8.2 6.4 5.9  HGB 10.8* 10.5* 10.1*  HCT 30.4* 28.7* 27.7*  PLT 170 133* 140*   BMET Recent Labs    08/15/18 0915 08/16/18 0324  NA 120* 123*  K 5.2* 5.1  CL 85* 90*  CO2 18* 20*  GLUCOSE 83 89  BUN 32* 37*  CREATININE 2.48* 2.78*  CALCIUM 8.4* 7.9*   LFT Recent Labs    08/15/18 0915 08/16/18 0324  PROT 6.4* 6.0*  ALBUMIN 2.4* 2.2*  AST 226* 215*  ALT 63* 60*  ALKPHOS 189* 168*  BILITOT 28.5* 30.3*  BILIDIR  --  20.2*  IBILI  --   NOT CALCULATED   PT/INR Recent Labs    08/15/18 0915 08/16/18 0324  LABPROT 23.9* 26.5*  INR 2.2* 2.5*   Hepatitis Panel No results for input(s): HEPBSAG, HCVAB, HEPAIGM, HEPBIGM in the last 72 hours.  Studies/Results: Ct Abdomen Pelvis Wo Contrast  Result Date: 08/15/2018 CLINICAL DATA:  Painless jaundice, fatigue and abdominal swelling. EXAM: CT ABDOMEN AND PELVIS WITHOUT CONTRAST TECHNIQUE: Multidetector CT imaging of the abdomen and pelvis was performed following the standard protocol without IV contrast. COMPARISON:  06/18/2013 FINDINGS: Lower chest: Small left pleural effusion noted with overlying atelectasis. The right lung base is clear. The heart is normal in size. No pericardial effusion. Marked eventration of the right hemidiaphragm with overlying atelectasis. Hepatobiliary: Hepatomegaly. The liver contour is irregular and the caudate lobe is prominent. The hepatic fissures are also slightly prominent. Findings consistent with cirrhosis. There is also diffuse heterogeneous fatty infiltration and possible superimposed hepatitis. No obvious hepatic lesion. The gallbladder is distended and demonstrates high attenuation which could be sludge. There also several small calcified gallstones. The gallbladder wall is thickened but this could be due to ascites and low albumin. No intra or extrahepatic biliary dilatation to suggest biliary obstruction. Pancreas: No mass, inflammation or ductal dilatation. Spleen: Splenomegaly.  Spleen measures 16 x 16 x 12.5 cm. No worrisome lesions. Adrenals/Urinary Tract: Adrenal glands and kidneys are unremarkable. No renal, ureteral or bladder calculi or obvious mass without contrast. Stomach/Bowel: The stomach, duodenum, small bowel and colon are grossly normal without oral contrast. No acute inflammatory changes, mass lesions or obstructive findings. Vascular/Lymphatic: Scattered atherosclerotic calcifications involving the aorta but no aneurysm. There are  numerous borderline upper abdominal lymph nodes typical with cirrhosis. Scattered retroperitoneal lymph nodes are also noted but no mass or overt adenopathy. Reproductive: The prostate gland and seminal vesicles are unremarkable. Other: Small volume abdominal/pelvic ascites. There is also diffuse mesenteric edema and diffuse body wall edema. Musculoskeletal: No significant bony findings. IMPRESSION: 1. CT findings consistent with cirrhosis and fatty infiltration of the liver. No focal hepatic lesions or evidence of biliary obstruction. There could be superimposed hepatitis. 2. Evidence of portal venous hypertension with portal venous collaterals, splenomegaly and ascites. 3. Distended gallbladder with high attenuation material which could suggest sludge. Several gallstones are also noted. Gallbladder wall thickening likely due to ascites and low albumin. 4. Small left pleural effusion with overlying atelectasis. 5. Diffuse body wall edema. Electronically Signed   By: Marijo Sanes M.D.   On: 08/15/2018 11:53   Dg Chest Portable 1 View  Result Date: 08/15/2018 CLINICAL DATA:  Anemia.  Vomiting. EXAM: PORTABLE CHEST 1 VIEW COMPARISON:  January 01, 2018 FINDINGS: Minimal opacity in the lateral left lung base. There is an elevated right hemidiaphragm. There is also opacity in the right lung base. The cardiomediastinal silhouette is normal. IMPRESSION: 1. The opacity in the right lung base may represent a pleural effusion and atelectasis. Infiltrate not excluded. 2. Mild opacity in the lateral left lung base favored represent atelectasis. Subtle infiltrate not excluded. Electronically Signed   By: Dorise Bullion III M.D   On: 08/15/2018 10:47   Scheduled Meds:  folic acid  1 mg Oral Daily   ipratropium-albuterol  3 mL Nebulization Q6H   metoCLOPramide (REGLAN) injection  10 mg Intravenous Q6H   multivitamin with minerals  1 tablet Oral Daily   pantoprazole (PROTONIX) IV  40 mg Intravenous Q12H    thiamine  100 mg Oral Daily   Or   thiamine  100 mg Intravenous Daily   Continuous Infusions:  sodium chloride 100 mL/hr at 08/16/18 0417   PRN Meds:.LORazepam **OR** LORazepam, ondansetron **OR** ondansetron (ZOFRAN) IV   ASSESMENT:   *    Cirrhosis.  Alcoholic hepatitis with jaundice.  Discriminant fx score 69.   On CT has gallstones, no evidence biliary obstruction, cirrhosis.  T bili up, alk phos and transaminases improved.    *    Hematemesis.  Streaks and small clots of blood mixed in with otherwise clear emesis.  N/V for at least 2 weeks.  Hx Barrets esophagus.  Compliant with daily Dexilant but also drinking vodka 750 mL's/day.   Non melenic FOBT + stool on DRE 6/17. R/o etoh gastritis, bleeding from portal hypertensive gastritis.  Given emesis is clear with inclusions of blood and no lg volume hematemesis, less worried about bleeding from esophageal varices and MWT.     *  Chronic alcoholism.  *   Blood loss anemia, MCV WNL.  Hgb 10.8 >> 10.1  *   Thrombocytopenia, non-critical.  Splenomegaly.  *   Coagulopathy.  INR 2.2 >> 2.5 despite Vit K 5 mg IV x 1.    *   Portal venous hypertension per CTAP.    *   AKI.  Worse.  Oliguria per pt.  ? HRS, ? ATN.    *   Hyponatremia, slightly improved 120 >> 123  *   Hyperkalemia.  Resolved  *   Hematuria.  Confirmed with > 50 RBCs/HPF. Rare bacteria, 0-5 WBCs.    *   Depression, anxiety.    PLAN   *  EGD mid day.   Acute hepatitis panel ordered.  Repeat CBC, CMET, coags in AM.    *   FFP x 2 U.  Repeat IV Vit K. Start prednisolone 66m/day  *   Check urine sodium, ordered. Measure urine output (strict Is/Os).  Increase NS to 1541mhour.     *   ? Renal consult? Consider midodrine, octreotide, albumin if HRS confirmed.    *   Ultrasound abdomen, if ascites tapable: paracentesis with labs to r/o SBP.     SaAzucena Freed6/18/2020, 8:42 AM Phone [phone]     Attending Physician Note   I have  taken an interval history, reviewed the chart and examined the patient. I agree with the Advanced Practitioner's note, impression and recommendations.   Severe alcoholic hepatitis. Started prednisolone. Trend CBC, CMP, PT/INR daily. Hematemesis. EGD today AKI, ? HRS. Urine Na and consider renal consult Abd [place] paracentesis to R/O SBP if ascites present   MaLucio EdwardMD FAPristine Hospital Of [phone]

## 2018-08-16 NOTE — Consult Note (Signed)
NAME:  Joseph Valenzuela, MRN:  [id number], DOB:  [birth year], LOS: 1 ADMISSION DATE:  08/15/2018, CONSULTATION DATE:  08/16/2018 REFERRING MD:  Dr. Fuller Plan, CHIEF COMPLAINT:  GIB/ hypotension  Brief History   44 yoM with history of heavy ETOH abuse/ fatty liver presenting with hypotension, hematemesis, N/V, anemia, AKI and transaminitis admitted for suspected UGIB.  Admitted to [facility].  GI consulted.  Taken for upper endo 6/18 under MAC anesthesia found to have grade II varices s/p 5 bands, patchy white plaques in the entire esophagus and severe portal hypertensive gastropathy.  Additionally with worsening renal function and nephrology consulted with concern for hepatorenal syndrome.  Ongoing hypotension post endo requiring brief use of vasopressor and albumin.  PCCM asked to assess for ICU transfer.  Prior to transfer, BP responded and neo stopped.  Pt transported back to progressive floor.  History of present illness   43 year old male with history of obesity, asthma, ETOH abuse, HTN, fatty liver, enlarged spleen, GERD/ barrett esophagus, depression, hx of SI, and anxiety presenting after 2 week history of nausea, vomiting with streaks of blood, possibly bloody loose stools, and reported blood in his urine.  Found to be jaundiced, in which he had not realized.  Reportedly drinks significant amounts of vodka every day currently (reports up to 787m/day).  Found to have ongoing hypotension with SBP in the 80's with workup notable for Hgb 10.8, MCV 88, Platelets 170, Na 120, K 5.2, sCr 2.4, BUN 32, t. Bili 28.5, AST/ ALT 226/63, INR 2.2, urine > 50 RBC.  CT abd/ pelvis showed cirrhosis and fatty liver with portal venous hypertension with portal collaterals, splenomegaly, and ascites.  [facility].  Hgb has remained stable with no tranfusions needed thus far.  Noted for worsening renal function today with nephrology consulted and concern for hepatorenal syndrome.  Taken  for upper endoscopy performed under MAC anesthesia 6/18 found to have grade II esophageal varices, patchy white plaques in the entire esophagus and severe portal hypertensive gastropathy.  Underwent banding x 5.  Patient with ongoing hypotension post procedure intermittently requiring vasopressor and given albumin.  PCCM asked to see in consultation; however, prior to our assessment, BP had responded and neo had been discontinued.  Currently SBP 130's.  Past Medical History  Obesity, asthma, ETOH abuse, HTN, fatty liver, enlarged spleen, GERD/ barrett esophagus, depression, hx of SI, anxiety  Significant Hospital Events   6/17 Admitted 6/18 UGI endo  Consults:  GI Nephrology   Procedures:  6/18 upper GI >> Grade II varices were found in the distal esophagus with mild bleeding. They were 6 mm in largest diameter. Five bands were successfully placed with complete eradication, resulting in deflation of varices.  Bleeding had stopped at the end of the procedure.  Patchy, white plaques were found in the entire esophagus. Biopsies not performed due to coagulopathy.  The exam of the esophagus was otherwise normal. Severe portal hypertensive gastropathy was found in the entire examined stomach.  The exam of the stomach was otherwise normal.  The duodenal bulb and second portion of the duodenum were normal.  Significant Diagnostic Tests:  6/17 CXR >> 1. The opacity in the right lung base may represent a pleural effusion and atelectasis. Infiltrate not excluded. 2. Mild opacity in the lateral left lung base favored represent atelectasis. Subtle infiltrate not excluded.  6/17 CT abd/ pelvis >> 1. CT findings consistent with cirrhosis and fatty infiltration of the liver. No focal  hepatic lesions or evidence of biliary obstruction. There could be superimposed hepatitis. 2. Evidence of portal venous hypertension with portal venous collaterals, splenomegaly and ascites. 3. Distended gallbladder with high  attenuation material which could suggest sludge. Several gallstones are also noted. Gallbladder wall thickening likely due to ascites and low albumin. 4. Small left pleural effusion with overlying atelectasis. 5. Diffuse body wall edema.  6/18 limited abd [place] >> Small amount of ascites, primarily along the liver  Micro Data:  6/17 SARS coron2 >> negative x 2  Antimicrobials:  n/a  Interim history/subjective:  Comfortable, some mild pain at banding sites.  Otherwise, no complaints.  Objective   Blood pressure (!) 97/38, pulse 84, temperature 98.9 F (37.2 C), temperature source Oral, resp. rate (!) 21, height _0  (1.753 m), weight 130.5 kg, SpO2 95 %.        Intake/Output Summary (Last 24 hours) at 08/16/2018 1535 Last data filed at 08/16/2018 1452 Gross per 24 hour  Intake 2567.09 ml  Output 200 ml  Net 2367.09 ml   Filed Weights   08/15/18 1237  Weight: 130.5 kg    Examination: General: Adult male, sitting on edge of bed, in NAD. Neuro: A&O x 3, no deficits. HEENT: [place]/AT. Scleral icterus.  EOMI. Cardiovascular: RRR, no M/R/G.  Lungs: Respirations even and unlabored.  CTA bilaterally, No W/R/R. Abdomen: Obese, BS x 4, soft, NT/ND.  Musculoskeletal: No gross deformities, no edema.  Skin: Jaundiced, warm.   Assessment & Plan:   Hypotension - presumed due to underlying hepatorenal syndrome.  Now resolved. - Continue midodrine 50m TID. - Goal SBP > 90.  UGIB - due to grade II esophageal varices in distal esophagus, s/p banding x 6. - GI following / managing. - Continue octreotide, PPI.  Cirrhosis with portal hypertensive gastropathy - presumed alcoholic (per report, drinks ~7513mvodka per day).  MELD 39, DF 89. Transaminitis - due to above. - GI following / managing. - F/u on hepatitis panel, RUQ [place] - Continue prednisolone. - Trend LFT's. - EtOH cessation counseling.  Coagulopathy - due to above. - Trend coags.  Ascites. - F/u on RUQ [place]- Avoid  any large volume paracentesis.  AKI - presumed hepatorenal syndrome, urine studies pending. - Nephrology following. - Continue midodrine, octreotide and albumin. - Agree hold lisinopril, HCTZ.  Hyponatremia - presumed due to beer potomania. - Continue supportive care.  Candidiasis. - Continue magic mouthwash swish and swallow x 7 days.  At risk for EtOH withdrawal. - Continue ativan per CIWA protocol. - Continue thiamine, folate, MVI.  Hx barretts esophagus. - No immediate interventions required.   Nothing further to add.  PCCM will sign off.  Please do not hesitate to call [email or web address] if we can be of any further assistance.   Best practice:  Diet: Regular - per GI. Pain/Anxiety/Delirium protocol (if indicated): Low dose fentanyl for pain. VAP protocol (if indicated): N/A. DVT prophylaxis: SCD's. GI prophylaxis: PPI. Glucose control: N/A. Mobility: Bedrest. Code Status: Full. Family Communication: None available. Disposition: Progressive.  Labs   CBC: Recent Labs  Lab 08/15/18 0915 08/15/18 2033 08/16/18 0324  WBC 8.2 6.4 5.9  NEUTROABS 6.1 4.9  --   HGB 10.8* 10.5* 10.1*  HCT 30.4* 28.7* 27.7*  MCV 88.6 87.8 88.2  PLT 170 133* 140*    Basic Metabolic Panel: Recent Labs  Lab 08/15/18 0915 08/15/18 0930 08/16/18 0324  NA 120*  --  123*  K 5.2*  --  5.1  CL 85*  --  90*  CO2 18*  --  20*  GLUCOSE 83  --  89  BUN 32*  --  37*  CREATININE 2.48*  --  2.78*  CALCIUM 8.4*  --  7.9*  MG  --  1.6*  --    GFR: Estimated Creatinine Clearance: 46.8 mL/min (A) (by C-G formula based on SCr of 2.78 mg/dL (H)). Recent Labs  Lab 08/15/18 0915 08/15/18 1125 08/15/18 2033 08/16/18 0324  WBC 8.2  --  6.4 5.9  LATICACIDVEN 3.9* 3.9*  --   --     Liver Function Tests: Recent Labs  Lab 08/15/18 0915 08/16/18 0324  AST 226* 215*  ALT 63* 60*  ALKPHOS 189* 168*  BILITOT 28.5* 30.3*  PROT 6.4* 6.0*  ALBUMIN 2.4* 2.2*   Recent Labs  Lab  08/15/18 0915  LIPASE 102*   Recent Labs  Lab 08/15/18 0915  AMMONIA 49*    ABG    Component Value Date/Time   HCO3 29.8 (H) 03/25/2007 1915   TCO2 27 01/01/2018 1422     Coagulation Profile: Recent Labs  Lab 08/15/18 0915 08/16/18 0324  INR 2.2* 2.5*    Cardiac Enzymes: Recent Labs  Lab 08/15/18 0915  TROPONINI <0.03    HbA1C: Hgb A1c MFr Bld  Date/Time Value Ref Range Status  03/20/2017 12:49 PM 5.8 4.6 - 6.5 % Final    Comment:    Glycemic Control Guidelines for People with Diabetes:Non Diabetic:  <6%Goal of Therapy: <7%Additional Action Suggested:  >8%   09/12/2014 08:51 AM 5.3 4.6 - 6.5 % Final    Comment:    Glycemic Control Guidelines for People with Diabetes:Non Diabetic:  <6%Goal of Therapy: <7%Additional Action Suggested:  >8%     CBG: No results for input(s): GLUCAP in the last 168 hours.  Review of Systems:   All negative; except for those that are bolded, which indicate positives.  Constitutional: weight loss, weight gain, night sweats, fevers, chills, fatigue, weakness.  HEENT: headaches, sore throat, sneezing, nasal congestion, post nasal drip, difficulty swallowing, tooth/dental problems, visual complaints, visual changes, ear aches. Neuro: difficulty with speech, weakness, numbness, ataxia. CV:  chest pain, orthopnea, PND, swelling in lower extremities, dizziness, palpitations, syncope.  Resp: cough, hemoptysis, dyspnea, wheezing. GI: heartburn, indigestion, abdominal pain, nausea, vomiting, diarrhea, constipation, change in bowel habits, loss of appetite, hematemesis, melena, hematochezia.  GU: dysuria, change in color of urine, urgency or frequency, flank pain, hematuria. MSK: joint pain or swelling, decreased range of motion. Psych: change in mood or affect, depression, anxiety, suicidal ideations, homicidal ideations. Skin: rash, itching, bruising.   Past Medical History  He,  has a past medical history of Alcoholism ([place]), Anxiety,  Asthma, Back pain, chronic, Barrett esophagus, Benign positional vertigo, Candidiasis of mouth, Degenerative spinal arthritis, Depression with anxiety, Esophageal reflux, Fatty liver, Herniated disc, Hiatal hernia, Hypertension, Irritable bowel syndrome, Obesity, Spleen enlarged, Suicidal ideations, and Vitamin B12 deficiency.   Surgical History    Past Surgical History:  Procedure Laterality Date   TYMPANOSTOMY TUBE PLACEMENT     UPPER GASTROINTESTINAL ENDOSCOPY       Social History   reports that he has never smoked. He has never used smokeless tobacco. He reports current alcohol use. He reports that he does not use drugs.   Family History   His family history includes Alcohol abuse in his father; Brain cancer in his maternal grandfather; Depression in his mother; Diabetes in his maternal aunt and mother; Hypertension in his brother; Stomach cancer in his  maternal grandmother. There is no history of Colon cancer or Esophageal cancer.   Allergies No Known Allergies   Home Medications  Prior to Admission medications   Medication Sig Start Date End Date Taking? Authorizing Provider  clonazePAM (KLONOPIN) 1 MG tablet Take one by mouth bid Patient taking differently: Take 1 mg by mouth 2 (two) times daily. Take one by mouth bid 05/30/18  Yes Burchette, Alinda Sierras, MD  dexlansoprazole (DEXILANT) 60 MG capsule Take 60 mg by mouth daily.   Yes [provider]  lisinopril-hydrochlorothiazide (PRINZIDE,ZESTORETIC) 20-12.5 MG tablet Take 1 tablet by mouth daily.   Yes [provider]     Montey Hora, [place] Pager: [phone].  If no answer, [phone] 08/16/2018, 4:45 PM

## 2018-08-16 NOTE — Transfer of Care (Signed)
Immediate Anesthesia Transfer of Care Note  Patient: Joseph Valenzuela  Procedure(s) Performed: ESOPHAGOGASTRODUODENOSCOPY (EGD) WITH PROPOFOL (N/A ) ESOPHAGEAL BANDING  Patient Location: Endoscopy Unit  Anesthesia Type:MAC  Level of Consciousness: drowsy and patient cooperative  Airway & Oxygen Therapy: Patient Spontanous Breathing  Post-op Assessment: Report given to RN, Post -op Vital signs reviewed and stable and Patient moving all extremities X 4  Post vital signs: Reviewed and stable  Last Vitals:  Vitals Value Taken Time  BP 95/25 08/16/18 1451  Temp    Pulse 83 08/16/18 1451  Resp 28 08/16/18 1451  SpO2 93 % 08/16/18 1451  Vitals shown include unvalidated device data.  Last Pain:  Vitals:   08/16/18 1348  TempSrc: Temporal  PainSc: 1          Complications: No apparent anesthesia complications

## 2018-08-16 NOTE — Progress Notes (Signed)
CRNA Sam made aware of pt blood pressure, came to room and treated. Pt A&OX4, stark MD made aware, states that he will get hospitalist involved. Will continue to monitor.

## 2018-08-16 NOTE — Progress Notes (Signed)
CRNA and Anesthesiologist stopped BP drip and hung albumin, will reevaluate pt BP after albumin to determine if BP drip needed. 5W RN updated about pt status, will continue to monitor.

## 2018-08-16 NOTE — Consult Note (Signed)
Reason for Consult:AKI Referring Physician: Dr. Verlon Setting is an 43 y.o. male.  HPI: 43 yr male with hx HTN x several years, Barret's esophagus, fatty live, ??DM (stopped Metformen), splenomegaly, obesity, DDD, GERD, and alcoholism, still drinking.  Called EMS with weakness and dark urine.  Has not noticed yellow skin.  Has l loose stool off and on, and some vomiting with blood.  Notes significant ankle edema.  No known hx renal dz, and no FH renal dz. No hx stones, UTIs, or NSAID use.  Has been on Lisiniopril/HCTZ.  Admit Cr 2.48 yest, now 2.78. SNa 120 and now 123. K 5.2 and 5.1. Bicarb 18 and 20.  Bili 30, alb 2.2,  INR 2.5,HB 10.1.   Urine >50 RBC.  Constitutional: weak, fatigue Eyes: negative Ears, nose, mouth, throat, and face: dry mouth Respiratory: negative Cardiovascular: edema Gastrointestinal: as above Genitourinary:blood in urine Integument/breast: as above Hematologic/lymphatic: negative Musculoskeletal:back issues Endocrine: ?DM Allergic/Immunologic: negative   Past Medical History:  Diagnosis Date  . Alcoholism (HCC)   . Anxiety   . Asthma   . Back pain, chronic   . Barrett esophagus   . Benign positional vertigo   . Candidiasis of mouth   . Degenerative spinal arthritis   . Depression with anxiety   . Esophageal reflux   . Fatty liver   . Herniated disc   . Hiatal hernia   . Hypertension   . Irritable bowel syndrome   . Obesity   . Spleen enlarged   . Suicidal ideations   . Vitamin B12 deficiency     Past Surgical History:  Procedure Laterality Date  . TYMPANOSTOMY TUBE PLACEMENT    . UPPER GASTROINTESTINAL ENDOSCOPY      Family History  Problem Relation Age of Onset  . Alcohol abuse Father   . Depression Mother   . Diabetes Mother   . Hypertension Brother   . Diabetes Maternal Aunt   . Stomach cancer Maternal Grandmother        great  . Brain cancer Maternal Grandfather   . Colon cancer Neg Hx   . Esophageal cancer Neg Hx      Social History:  reports that he has never smoked. He has never used smokeless tobacco. He reports current alcohol use. He reports that he does not use drugs.  Allergies: No Known Allergies  Medications:  I have reviewed the patient's current medications. Prior to Admission:  Medications Prior to Admission  Medication Sig Dispense Refill Last Dose  . clonazePAM (KLONOPIN) 1 MG tablet Take one by mouth bid (Patient taking differently: Take 1 mg by mouth 2 (two) times daily. Take one by mouth bid) 60 tablet 5 08/14/2018 at Unknown time  . dexlansoprazole (DEXILANT) 60 MG capsule Take 60 mg by mouth daily.   08/13/2018  . lisinopril-hydrochlorothiazide (PRINZIDE,ZESTORETIC) 20-12.5 MG tablet Take 1 tablet by mouth daily.   08/11/2018    Results for orders placed or performed during the hospital encounter of 08/15/18 (from the past 48 hour(s))  CBC with Differential     Status: Abnormal   Collection Time: 08/15/18  9:15 AM  Result Value Ref Range   WBC 8.2 4.0 - 10.5 K/uL   RBC 3.43 (L) 4.22 - 5.81 MIL/uL   Hemoglobin 10.8 (L) 13.0 - 17.0 g/dL   HCT 16.1 (L) 09.6 - 04.5 %   MCV 88.6 80.0 - 100.0 fL   MCH 31.5 26.0 - 34.0 pg   MCHC 35.5 30.0 - 36.0  g/dL   RDW 18.7 (H) 11.5 - 15.5 %   Platelets 170 150 - 400 K/uL   nRBC 0.0 0.0 - 0.2 %   Neutrophils Relative % 74 %   Neutro Abs 6.1 1.7 - 7.7 K/uL   Lymphocytes Relative 13 %   Lymphs Abs 1.1 0.7 - 4.0 K/uL   Monocytes Relative 11 %   Monocytes Absolute 0.9 0.1 - 1.0 K/uL   Eosinophils Relative 1 %   Eosinophils Absolute 0.1 0.0 - 0.5 K/uL   Basophils Relative 0 %   Basophils Absolute 0.0 0.0 - 0.1 K/uL   Immature Granulocytes 1 %   Abs Immature Granulocytes 0.08 (H) 0.00 - 0.07 K/uL    Comment: Performed at [facility] [address], [place], [place] [address]  Comprehensive metabolic panel     Status: Abnormal   Collection Time: 08/15/18  9:15 AM  Result Value Ref Range   Sodium 120 (L) 135 - 145 mmol/L   Potassium  5.2 (H) 3.5 - 5.1 mmol/L   Chloride 85 (L) 98 - 111 mmol/L   CO2 18 (L) 22 - 32 mmol/L   Glucose, Bld 83 70 - 99 mg/dL   BUN 32 (H) 6 - 20 mg/dL   Creatinine, Ser 2.48 (H) 0.61 - 1.24 mg/dL   Calcium 8.4 (L) 8.9 - 10.3 mg/dL   Total Protein 6.4 (L) 6.5 - 8.1 g/dL   Albumin 2.4 (L) 3.5 - 5.0 g/dL   AST 226 (H) 15 - 41 U/L   ALT 63 (H) 0 - 44 U/L   Alkaline Phosphatase 189 (H) 38 - 126 U/L   Total Bilirubin 28.5 (HH) 0.3 - 1.2 mg/dL    Comment: CRITICAL RESULT CALLED TO, READ BACK BY AND VERIFIED WITH: H.HARDY,RN 1033 08/15/2018 CLARK,S    GFR calc non Af Amer 31 (L) >60 mL/min   GFR calc Af Amer 36 (L) >60 mL/min   Anion gap 17 (H) 5 - 15    Comment: Performed at [facility], [place] [address] [place], [place] [address]  Lipase, blood     Status: Abnormal   Collection Time: 08/15/18  9:15 AM  Result Value Ref Range   Lipase 102 (H) 11 - 51 U/L    Comment: Performed at [facility], [place] [address] [place], [place] [address]  Ammonia     Status: Abnormal   Collection Time: 08/15/18  9:15 AM  Result Value Ref Range   Ammonia 49 (H) 9 - 35 umol/L    Comment: Performed at [facility], [place] [address], [place], [place] [address]  Protime-INR     Status: Abnormal   Collection Time: 08/15/18  9:15 AM  Result Value Ref Range   Prothrombin Time 23.9 (H) 11.4 - 15.2 seconds   INR 2.2 (H) 0.8 - 1.2    Comment: (NOTE) INR goal varies based on device and disease states. Performed at [facility], [place] [address] [place], [place] [address]   Lactic acid, plasma     Status: Abnormal   Collection Time: 08/15/18  9:15 AM  Result Value Ref Range   Lactic Acid, Venous 3.9 (HH) 0.5 - 1.9 mmol/L    Comment: CRITICAL RESULT CALLED TO, READ BACK BY AND VERIFIED WITH: H.HARDEN,RN 1005 08/15/2018 CLARK,S Performed at [facility] [address] [place], [place] [address]   Troponin I - ONCE - STAT     Status: None   Collection  Time: 08/15/18  9:15 AM  Result Value  Ref Range   Troponin I <0.03 <0.03 ng/mL    Comment: Performed at [facility], [address] [place], [place] [address]  Ethanol     Status: Abnormal   Collection Time: 08/15/18  9:15 AM  Result Value Ref Range   Alcohol, Ethyl (B) 22 (H) <10 mg/dL    Comment: (NOTE) Lowest detectable limit for serum alcohol is 10 mg/dL. For medical purposes only. Performed at [facility], [address] [place], [place] [address]   Type and screen [facility]     Status: None   Collection Time: 08/15/18  9:16 AM  Result Value Ref Range   ABO/RH(D) A NEG    Antibody Screen NEG    Sample Expiration      08/18/2018,2359 Performed at [facility], [address] [place], [place] [address]   ABO/Rh     Status: None   Collection Time: 08/15/18  9:16 AM  Result Value Ref Range   ABO/RH(D)      A NEG Performed at [facility], [address], [place], [place] [address]   Magnesium     Status: Abnormal   Collection Time: 08/15/18  9:30 AM  Result Value Ref Range   Magnesium 1.6 (L) 1.7 - 2.4 mg/dL    Comment: Performed at [facility], [address] [place], [place] [address]  POC occult blood, ED Provider will collect     Status: Abnormal   Collection Time: 08/15/18  9:34 AM  Result Value Ref Range   Fecal Occult Bld POSITIVE (A) NEGATIVE  Novel Coronavirus,NAA,(SEND-OUT TO REF LAB - TAT 24-48 hrs); Hosp Order     Status: None   Collection Time: 08/15/18 10:31 AM   Specimen: Nasopharyngeal Swab; Respiratory  Result Value Ref Range   SARS-CoV-2, NAA NOT DETECTED NOT DETECTED    Comment: (NOTE) This test was developed and its performance characteristics determined by [employer]. This test has not been FDA cleared or approved. This test has been authorized by FDA under an Emergency Use Authorization (EUA). This test is only authorized for the duration of time the declaration that circumstances exist justifying the authorization of  the emergency use of in vitro diagnostic tests for detection of SARS-CoV-2 virus and/or diagnosis of COVID-19 infection under section 564(b)(1) of the Act, 21 U.S.C. 130QMV-7(Q)(4), unless the authorization is terminated or revoked sooner. When diagnostic testing is negative, the possibility of a false negative result should be considered in the context of a patient's recent exposures and the presence of clinical signs and symptoms consistent with COVID-19. An individual without symptoms of COVID-19 and who is not shedding SARS-CoV-2 virus would expect to have a negative (not detected) result in this assay. Performed  At: [facility] [address] [place], [place] [id number] Jolene Schimke MD XL:[id number]    Coronavirus Source NASOPHARYNGEAL     Comment: Performed at [facility], [address] [place], [place] [address]  Lactic acid, plasma     Status: Abnormal   Collection Time: 08/15/18 11:25 AM  Result Value Ref Range   Lactic Acid, Venous 3.9 (HH) 0.5 - 1.9 mmol/L    Comment: CRITICAL RESULT CALLED TO, READ BACK BY AND VERIFIED WITH: H.HARDY,RN 1210 08/15/2018 CLARK,S Performed at [facility], [address] [place], [place] [address]   HIV antibody (Routine Testing)     Status: None  Collection Time: 08/15/18  2:19 PM  Result Value Ref Range   HIV Screen 4th Generation wRfx Non Reactive Non Reactive    Comment: (NOTE) Performed At: [facility] [address] [place], [place] [id number] Jolene Schimke MD NW:[id number]   SARS Coronavirus 2     Status: None   Collection Time: 08/15/18  2:19 PM  Result Value Ref Range   SARS Coronavirus 2 NOT DETECTED NOT DETECTED    Comment: (NOTE) SARS-CoV-2 target nucleic acids are NOT DETECTED. The SARS-CoV-2 RNA is generally detectable in upper and lower respiratory specimens during the acute phase of infection.  Negative  results do not preclude SARS-CoV-2 infection, do not rule out co-infections  with other pathogens, and should not be used as the sole basis for treatment or other patient management decisions.  Negative results must be combined with clinical observations, patient history, and epidemiological information. The expected result is Not Detected. Fact Sheet for Patients: http://www.biofiredefense.com/wp-content/uploads/2020/03/BIOFIRE-COVID -19-patients.pdf Fact Sheet for Healthcare Providers: http://www.biofiredefense.com/wp-content/uploads/2020/03/BIOFIRE-COVID -19-hcp.pdf This test is not yet approved or cleared by the [place] and  has been authorized for detection and/or diagnosis of SARS-CoV-2 by FDA under an Emergency Use Authorization (EUA).  This EUA will remain in effec t (meaning this test can be used) for the duration of  the COVID-19 declaration under Section 564(b)(1) of the Act, 21 U.S.C. section 360bbb-3(b)(1), unless the authorization is terminated or revoked sooner. Performed at [facility], [address], [place], [place] [address]   CBC with Differential/Platelet     Status: Abnormal   Collection Time: 08/15/18  8:33 PM  Result Value Ref Range   WBC 6.4 4.0 - 10.5 K/uL   RBC 3.27 (L) 4.22 - 5.81 MIL/uL   Hemoglobin 10.5 (L) 13.0 - 17.0 g/dL   HCT 96.2 (L) 95.2 - 84.1 %   MCV 87.8 80.0 - 100.0 fL   MCH 32.1 26.0 - 34.0 pg   MCHC 36.6 (H) 30.0 - 36.0 g/dL   RDW 32.4 (H) 40.1 - 02.7 %   Platelets 133 (L) 150 - 400 K/uL   nRBC 0.0 0.0 - 0.2 %   Neutrophils Relative % 76 %   Neutro Abs 4.9 1.7 - 7.7 K/uL   Lymphocytes Relative 11 %   Lymphs Abs 0.7 0.7 - 4.0 K/uL   Monocytes Relative 10 %   Monocytes Absolute 0.6 0.1 - 1.0 K/uL   Eosinophils Relative 1 %   Eosinophils Absolute 0.0 0.0 - 0.5 K/uL   Basophils Relative 0 %   Basophils Absolute 0.0 0.0 - 0.1 K/uL   Immature Granulocytes 2 %   Abs Immature Granulocytes 0.10 (H) 0.00 - 0.07 K/uL    Comment: Performed at [facility], [address], [place], [place]  [address]  Urinalysis, Routine w reflex microscopic     Status: Abnormal   Collection Time: 08/15/18 10:12 PM  Result Value Ref Range   Color, Urine AMBER (A) YELLOW    Comment: BIOCHEMICALS MAY BE AFFECTED BY COLOR   APPearance CLOUDY (A) CLEAR   Specific Gravity, Urine 1.017 1.005 - 1.030   pH 5.0 5.0 - 8.0   Glucose, UA 50 (A) NEGATIVE mg/dL   Hgb urine dipstick LARGE (A) NEGATIVE   Bilirubin Urine MODERATE (A) NEGATIVE   Ketones, ur 5 (A) NEGATIVE mg/dL   Protein, ur 440 (A) NEGATIVE mg/dL   Nitrite NEGATIVE NEGATIVE   Leukocytes,Ua NEGATIVE NEGATIVE   RBC / HPF >50 (H) 0 - 5  RBC/hpf   WBC, UA 0-5 0 - 5 WBC/hpf   Bacteria, UA RARE (A) NONE SEEN   Mucus PRESENT    Hyaline Casts, UA PRESENT    Granular Casts, UA PRESENT     Comment: Performed at [facility], [address] [place], [place] [address]  CBC     Status: Abnormal   Collection Time: 08/16/18  3:24 AM  Result Value Ref Range   WBC 5.9 4.0 - 10.5 K/uL   RBC 3.14 (L) 4.22 - 5.81 MIL/uL   Hemoglobin 10.1 (L) 13.0 - 17.0 g/dL   HCT 84.127.7 (L) 32.439.0 - 40.152.0 %   MCV 88.2 80.0 - 100.0 fL   MCH 32.2 26.0 - 34.0 pg   MCHC 36.5 (H) 30.0 - 36.0 g/dL   RDW 02.718.9 (H) 25.311.5 - 66.415.5 %   Platelets 140 (L) 150 - 400 K/uL    Comment: REPEATED TO VERIFY Immature Platelet Fraction may be clinically indicated, consider ordering this additional test [id number]    nRBC 0.0 0.0 - 0.2 %    Comment: Performed at [facility], [address] [place], [place] [address]  Basic metabolic panel     Status: Abnormal   Collection Time: 08/16/18  3:24 AM  Result Value Ref Range   Sodium 123 (L) 135 - 145 mmol/L   Potassium 5.1 3.5 - 5.1 mmol/L   Chloride 90 (L) 98 - 111 mmol/L   CO2 20 (L) 22 - 32 mmol/L   Glucose, Bld 89 70 - 99 mg/dL   BUN 37 (H) 6 - 20 mg/dL   Creatinine, Ser 7.562.78 (H) 0.61 - 1.24 mg/dL   Calcium 7.9 (L) 8.9 - 10.3 mg/dL   GFR calc non Af Amer 27 (L) >60 mL/min   GFR calc Af Amer 31 (L) >60 mL/min   Anion gap 13  5 - 15    Comment: Performed at [facility], [address] [place], [place] [address]  Hepatic function panel     Status: Abnormal   Collection Time: 08/16/18  3:24 AM  Result Value Ref Range   Total Protein 6.0 (L) 6.5 - 8.1 g/dL   Albumin 2.2 (L) 3.5 - 5.0 g/dL   AST 518215 (H) 15 - 41 U/L   ALT 60 (H) 0 - 44 U/L   Alkaline Phosphatase 168 (H) 38 - 126 U/L   Total Bilirubin 30.3 (HH) 0.3 - 1.2 mg/dL    Comment: RESULTS CONFIRMED BY MANUAL DILUTION CRITICAL VALUE NOTED.  VALUE IS CONSISTENT WITH PREVIOUSLY REPORTED AND CALLED VALUE.    Bilirubin, Direct 20.2 (H) 0.0 - 0.2 mg/dL    Comment: RESULTS CONFIRMED BY MANUAL DILUTION   Indirect Bilirubin NOT CALCULATED 0.3 - 0.9 mg/dL    Comment: Performed at [facility], [address] [place], [place] [address]  Protime-INR     Status: Abnormal   Collection Time: 08/16/18  3:24 AM  Result Value Ref Range   Prothrombin Time 26.5 (H) 11.4 - 15.2 seconds   INR 2.5 (H) 0.8 - 1.2    Comment: (NOTE) INR goal varies based on device and disease states. Performed at [facility], [address] [place], [place] [address]   Prepare fresh frozen plasma     Status: None (Preliminary result)   Collection Time: 08/16/18  9:33 AM  Result Value Ref Range   Unit Number [id number]    Blood Component Type THAWED PLASMA    Unit  division 00    Status of Unit ISSUED    Transfusion Status      OK TO TRANSFUSE Performed at [facility], [address] [place], [place] [address]    Unit Number [id number]    Blood Component Type THAWED PLASMA    Unit division 00    Status of Unit ISSUED    Transfusion Status OK TO TRANSFUSE     Ct Abdomen Pelvis Wo Contrast  Result Date: 08/15/2018 CLINICAL DATA:  Painless jaundice, fatigue and abdominal swelling. EXAM: CT ABDOMEN AND PELVIS WITHOUT CONTRAST TECHNIQUE: Multidetector CT imaging of the abdomen and pelvis was performed following the standard protocol without IV  contrast. COMPARISON:  06/18/2013 FINDINGS: Lower chest: Small left pleural effusion noted with overlying atelectasis. The right lung base is clear. The heart is normal in size. No pericardial effusion. Marked eventration of the right hemidiaphragm with overlying atelectasis. Hepatobiliary: Hepatomegaly. The liver contour is irregular and the caudate lobe is prominent. The hepatic fissures are also slightly prominent. Findings consistent with cirrhosis. There is also diffuse heterogeneous fatty infiltration and possible superimposed hepatitis. No obvious hepatic lesion. The gallbladder is distended and demonstrates high attenuation which could be sludge. There also several small calcified gallstones. The gallbladder wall is thickened but this could be due to ascites and low albumin. No intra or extrahepatic biliary dilatation to suggest biliary obstruction. Pancreas: No mass, inflammation or ductal dilatation. Spleen: Splenomegaly. Spleen measures 16 x 16 x 12.5 cm. No worrisome lesions. Adrenals/Urinary Tract: Adrenal glands and kidneys are unremarkable. No renal, ureteral or bladder calculi or obvious mass without contrast. Stomach/Bowel: The stomach, duodenum, small bowel and colon are grossly normal without oral contrast. No acute inflammatory changes, mass lesions or obstructive findings. Vascular/Lymphatic: Scattered atherosclerotic calcifications involving the aorta but no aneurysm. There are numerous borderline upper abdominal lymph nodes typical with cirrhosis. Scattered retroperitoneal lymph nodes are also noted but no mass or overt adenopathy. Reproductive: The prostate gland and seminal vesicles are unremarkable. Other: Small volume abdominal/pelvic ascites. There is also diffuse mesenteric edema and diffuse body wall edema. Musculoskeletal: No significant bony findings. IMPRESSION: 1. CT findings consistent with cirrhosis and fatty infiltration of the liver. No focal hepatic lesions or evidence of  biliary obstruction. There could be superimposed hepatitis. 2. Evidence of portal venous hypertension with portal venous collaterals, splenomegaly and ascites. 3. Distended gallbladder with high attenuation material which could suggest sludge. Several gallstones are also noted. Gallbladder wall thickening likely due to ascites and low albumin. 4. Small left pleural effusion with overlying atelectasis. 5. Diffuse body wall edema. Electronically Signed   By: Rudie MeyerP.  Gallerani M.D.   On: 08/15/2018 11:53   [place] Abdomen Limited  Result Date: 08/16/2018 CLINICAL DATA:  43 year old male with abdominal distension. Evaluate for ascites. EXAM: LIMITED ABDOMEN ULTRASOUND FOR ASCITES TECHNIQUE: Limited ultrasound survey for ascites was performed in all four abdominal quadrants. COMPARISON:  None. FINDINGS: A small amount of ascites is noted adjacent to the liver. A tiny amount ascites is noted pelvis. IMPRESSION: Small amount of ascites, primarily along the liver. Electronically Signed   By: Harmon PierJeffrey  Hu M.D.   On: 08/16/2018 12:18   Dg Chest Portable 1 View  Result Date: 08/15/2018 CLINICAL DATA:  Anemia.  Vomiting. EXAM: PORTABLE CHEST 1 VIEW COMPARISON:  January 01, 2018 FINDINGS: Minimal opacity in the lateral left lung base. There is an elevated right hemidiaphragm. There is also opacity in the right lung base. The cardiomediastinal silhouette is normal. IMPRESSION: 1.  The opacity in the right lung base may represent a pleural effusion and atelectasis. Infiltrate not excluded. 2. Mild opacity in the lateral left lung base favored represent atelectasis. Subtle infiltrate not excluded. Electronically Signed   By: Gerome Samavid  Williams III M.D   On: 08/15/2018 10:47    ROS Blood pressure (!) 87/37, pulse 79, temperature 98.5 F (36.9 C), temperature source Oral, resp. rate (!) 22, height 5\' 9"  (1.753 m), weight 130.5 kg, SpO2 96 %. Physical Exam Physical Examination: General appearance - obese jaundiced Mental status -  slowed but oriented Eyes - icteric scleric, fundi ok , EOMs ok Mouth - reddened Neck - adenopathy noted PCL Lymphatics - posterior cervical nodes Chest - decreased air entry noted bilat Heart - S1 and S2 normal Abdomen - pos bs, distended, tender RUQ Extremities - pedal edema 3 +, intact peripheral pulses Skin - jaundiced  Assessment/Plan: 1 AKI with acidemia, ^ solute, low bps. In setting of liver failure, coagulopathy,synthetic defects.  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  The only varialbe is the Lisinopril  .  Will tx as HRs and get values.  2 Hepatic failure  ETOH, ? Any other causes 3 Hypertension: not an issue 4. Anemia follow 5. GIB 6 Fatty liver 7 Obesity P Mido, octreo, alb, vol expand, NO LARGE VOL PARACENTESIS  Almetta Liddicoat 08/16/2018, 1:20 PM

## 2018-08-16 NOTE — H&P (View-Only) (Signed)
Daily Rounding Note  08/16/2018, 8:42 AM  LOS: 1 day   SUBJECTIVE:   Chief complaint: alcoholic hepatitis.  Limited hematemesis.      Generally feels bad  No further N/V.  Last BM was yest AM PTA.   Now with pain in RUQ, not triggered by movement of torso.  Not much urine output.    Contd soft BPs,in 80s.40s, receiving periodic NS bolus.  No tachycardia.      OBJECTIVE:         Vital signs in last 24 hours:    Temp:  [97.9 F (36.6 C)-98.3 F (36.8 C)] 97.9 F (36.6 C) (06/18 0438) Pulse Rate:  [61-83] 74 (06/18 0330) Resp:  [13-21] 19 (06/18 0330) BP: (82-114)/(38-89) 96/44 (06/18 0330) SpO2:  [93 %-100 %] 98 % (06/18 0759) Weight:  [130.5 kg] 130.5 kg (06/17 1237) Last BM Date: 08/15/18 Filed Weights   08/15/18 1237  Weight: 130.5 kg   General: Jaundiced, unwell, alert.     Heart: RRR Chest: clear bil.  No labored breathing Abdomen: obese, distended, moderately soft, RUQ tender without G/R.  Active BS  Extremities: + LE edema and petechail patterned redness in lower legs Neuro/Psych:  Oriented x 3.  No tremor or asterixis.  Speech and cognition still slow but improved from yesterday.    Intake/Output from previous day: 06/17 0701 - 06/18 0700 In: 3273.1 [P.O.:120; I.V.:1447; IV Piggyback:1706.1] Out: 200 [Urine:200]  Intake/Output this shift: No intake/output data recorded.  Lab Results: Recent Labs    08/15/18 0915 08/15/18 2033 08/16/18 0324  WBC 8.2 6.4 5.9  HGB 10.8* 10.5* 10.1*  HCT 30.4* 28.7* 27.7*  PLT 170 133* 140*   BMET Recent Labs    08/15/18 0915 08/16/18 0324  NA 120* 123*  K 5.2* 5.1  CL 85* 90*  CO2 18* 20*  GLUCOSE 83 89  BUN 32* 37*  CREATININE 2.48* 2.78*  CALCIUM 8.4* 7.9*   LFT Recent Labs    08/15/18 0915 08/16/18 0324  PROT 6.4* 6.0*  ALBUMIN 2.4* 2.2*  AST 226* 215*  ALT 63* 60*  ALKPHOS 189* 168*  BILITOT 28.5* 30.3*  BILIDIR  --  20.2*  IBILI  --   NOT CALCULATED   PT/INR Recent Labs    08/15/18 0915 08/16/18 0324  LABPROT 23.9* 26.5*  INR 2.2* 2.5*   Hepatitis Panel No results for input(s): HEPBSAG, HCVAB, HEPAIGM, HEPBIGM in the last 72 hours.  Studies/Results: Ct Abdomen Pelvis Wo Contrast  Result Date: 08/15/2018 CLINICAL DATA:  Painless jaundice, fatigue and abdominal swelling. EXAM: CT ABDOMEN AND PELVIS WITHOUT CONTRAST TECHNIQUE: Multidetector CT imaging of the abdomen and pelvis was performed following the standard protocol without IV contrast. COMPARISON:  06/18/2013 FINDINGS: Lower chest: Small left pleural effusion noted with overlying atelectasis. The right lung base is clear. The heart is normal in size. No pericardial effusion. Marked eventration of the right hemidiaphragm with overlying atelectasis. Hepatobiliary: Hepatomegaly. The liver contour is irregular and the caudate lobe is prominent. The hepatic fissures are also slightly prominent. Findings consistent with cirrhosis. There is also diffuse heterogeneous fatty infiltration and possible superimposed hepatitis. No obvious hepatic lesion. The gallbladder is distended and demonstrates high attenuation which could be sludge. There also several small calcified gallstones. The gallbladder wall is thickened but this could be due to ascites and low albumin. No intra or extrahepatic biliary dilatation to suggest biliary obstruction. Pancreas: No mass, inflammation or ductal dilatation. Spleen: Splenomegaly.  Spleen measures 16 x 16 x 12.5 cm. No worrisome lesions. Adrenals/Urinary Tract: Adrenal glands and kidneys are unremarkable. No renal, ureteral or bladder calculi or obvious mass without contrast. Stomach/Bowel: The stomach, duodenum, small bowel and colon are grossly normal without oral contrast. No acute inflammatory changes, mass lesions or obstructive findings. Vascular/Lymphatic: Scattered atherosclerotic calcifications involving the aorta but no aneurysm. There are  numerous borderline upper abdominal lymph nodes typical with cirrhosis. Scattered retroperitoneal lymph nodes are also noted but no mass or overt adenopathy. Reproductive: The prostate gland and seminal vesicles are unremarkable. Other: Small volume abdominal/pelvic ascites. There is also diffuse mesenteric edema and diffuse body wall edema. Musculoskeletal: No significant bony findings. IMPRESSION: 1. CT findings consistent with cirrhosis and fatty infiltration of the liver. No focal hepatic lesions or evidence of biliary obstruction. There could be superimposed hepatitis. 2. Evidence of portal venous hypertension with portal venous collaterals, splenomegaly and ascites. 3. Distended gallbladder with high attenuation material which could suggest sludge. Several gallstones are also noted. Gallbladder wall thickening likely due to ascites and low albumin. 4. Small left pleural effusion with overlying atelectasis. 5. Diffuse body wall edema. Electronically Signed   By: P.  Gallerani M.D.   On: 08/15/2018 11:53  ° °Dg Chest Portable 1 View ° °Result Date: 08/15/2018 °CLINICAL DATA:  Anemia.  Vomiting. EXAM: PORTABLE CHEST 1 VIEW COMPARISON:  January 01, 2018 FINDINGS: Minimal opacity in the lateral left lung base. There is an elevated right hemidiaphragm. There is also opacity in the right lung base. The cardiomediastinal silhouette is normal. IMPRESSION: 1. The opacity in the right lung base may represent a pleural effusion and atelectasis. Infiltrate not excluded. 2. Mild opacity in the lateral left lung base favored represent atelectasis. Subtle infiltrate not excluded. Electronically Signed   By: David  Williams III M.D   On: 08/15/2018 10:47  ° °Scheduled Meds: °• folic acid  1 mg Oral Daily  °• ipratropium-albuterol  3 mL Nebulization Q6H  °• metoCLOPramide (REGLAN) injection  10 mg Intravenous Q6H  °• multivitamin with minerals  1 tablet Oral Daily  °• pantoprazole (PROTONIX) IV  40 mg Intravenous Q12H  °•  thiamine  100 mg Oral Daily  ° Or  °• thiamine  100 mg Intravenous Daily  ° °Continuous Infusions: °• sodium chloride 100 mL/hr at 08/16/18 0417  ° °PRN Meds:.LORazepam **OR** LORazepam, ondansetron **OR** ondansetron (ZOFRAN) IV ° ° °ASSESMENT:  ° °*    Cirrhosis.  Alcoholic hepatitis with jaundice.  Discriminant fx score 69.   °On CT has gallstones, no evidence biliary obstruction, cirrhosis.  °T bili up, alk phos and transaminases improved.   °  °*    Hematemesis.  Streaks and small clots of blood mixed in with otherwise clear emesis.  N/V for at least 2 weeks.  Hx Barrets esophagus.  Compliant with daily Dexilant but also drinking vodka 750 mL's/day.   °Non melenic FOBT + stool on DRE 6/17. °R/o etoh gastritis, bleeding from portal hypertensive gastritis.  Given emesis is clear with inclusions of blood and no lg volume hematemesis, less worried about bleeding from esophageal varices and MWT.    °  °*  Chronic alcoholism. °  °*   Blood loss anemia, MCV WNL.  Hgb 10.8 >> 10.1 ° °*   Thrombocytopenia, non-critical.  Splenomegaly. °  °*   Coagulopathy.  INR 2.2 >> 2.5 despite Vit K 5 mg IV x 1.   °  °*   Portal venous hypertension per CTAP.   °  °*   AKI.    Worse.  Oliguria per pt.  ? HRS, ? ATN.    *   Hyponatremia, slightly improved 120 >> 123  *   Hyperkalemia.  Resolved  *   Hematuria.  Confirmed with > 50 RBCs/HPF. Rare bacteria, 0-5 WBCs.    *   Depression, anxiety.    PLAN   *  EGD mid day.   Acute hepatitis panel ordered.  Repeat CBC, CMET, coags in AM.    *   FFP x 2 U.  Repeat IV Vit K. Start prednisolone 66m/day  *   Check urine sodium, ordered. Measure urine output (strict Is/Os).  Increase NS to 1541mhour.     *   ? Renal consult? Consider midodrine, octreotide, albumin if HRS confirmed.    *   Ultrasound abdomen, if ascites tapable: paracentesis with labs to r/o SBP.     SaAzucena Freed6/18/2020, 8:42 AM Phone [phone]     Attending Physician Note   I have  taken an interval history, reviewed the chart and examined the patient. I agree with the Advanced Practitioner's note, impression and recommendations.   Severe alcoholic hepatitis. Started prednisolone. Trend CBC, CMP, PT/INR daily. Hematemesis. EGD today AKI, ? HRS. Urine Na and consider renal consult Abd USKoreand paracentesis to R/O SBP if ascites present   MaLucio EdwardMD FAPristine Hospital Of [phone]

## 2018-08-16 NOTE — Progress Notes (Signed)
Gifford Shave MD (anesteseoligist) made aware of most recent BP of 119/54, states that if patient has similar repeat pressure pt can go back to 5W. Also spoke with Lelon Frohlich PA with GI who advises that pt. Can return to 5W. Report given to Sam RN and [employer].

## 2018-08-16 NOTE — Interval H&P Note (Signed)
History and Physical Interval Note:  08/16/2018 2:18 PM  Joseph Valenzuela  has presented today for surgery, with the diagnosis of Nausea, vomiting, anemia.  Sees streaks of blood in his vomit.  Call abuse..  The various methods of treatment have been discussed with the patient and family. After consideration of risks, benefits and other options for treatment, the patient has consented to  Procedure(s): ESOPHAGOGASTRODUODENOSCOPY (EGD) WITH PROPOFOL (N/A) as a surgical intervention.  The patient's history has been reviewed, patient examined, no change in status, stable for surgery.  I have reviewed the patient's chart and labs.  Questions were answered to the patient's satisfaction.     Pricilla Riffle. Fuller Plan

## 2018-08-17 DIAGNOSIS — K7290 Hepatic failure, unspecified without coma: Secondary | ICD-10-CM

## 2018-08-17 DIAGNOSIS — D689 Coagulation defect, unspecified: Secondary | ICD-10-CM

## 2018-08-17 DIAGNOSIS — D62 Acute posthemorrhagic anemia: Secondary | ICD-10-CM

## 2018-08-17 DIAGNOSIS — I8501 Esophageal varices with bleeding: Secondary | ICD-10-CM

## 2018-08-17 LAB — COMPREHENSIVE METABOLIC PANEL
ALT: 64 U/L — ABNORMAL HIGH (ref 0–44)
AST: 189 U/L — ABNORMAL HIGH (ref 15–41)
Albumin: 2.5 g/dL — ABNORMAL LOW (ref 3.5–5.0)
Alkaline Phosphatase: 158 U/L — ABNORMAL HIGH (ref 38–126)
Anion gap: 15 (ref 5–15)
BUN: 41 mg/dL — ABNORMAL HIGH (ref 6–20)
CO2: 27 mmol/L (ref 22–32)
Calcium: 7.5 mg/dL — ABNORMAL LOW (ref 8.9–10.3)
Chloride: 83 mmol/L — ABNORMAL LOW (ref 98–111)
Creatinine, Ser: 2.51 mg/dL — ABNORMAL HIGH (ref 0.61–1.24)
GFR calc Af Amer: 35 mL/min — ABNORMAL LOW (ref >60)
GFR calc non Af Amer: 31 mL/min — ABNORMAL LOW (ref >60)
Glucose, Bld: 78 mg/dL (ref 70–99)
Potassium: 4.2 mmol/L (ref 3.5–5.1)
Sodium: 125 mmol/L — ABNORMAL LOW (ref 135–145)
Total Bilirubin: 34.4 mg/dL (ref 0.3–1.2)
Total Protein: 6 g/dL — ABNORMAL LOW (ref 6.5–8.1)

## 2018-08-17 LAB — PREPARE FRESH FROZEN PLASMA
Unit division: 0
Unit division: 0

## 2018-08-17 LAB — BPAM FFP
Blood Product Expiration Date: 202006222359
Blood Product Expiration Date: 202006222359
ISSUE DATE / TIME: 202006181145
ISSUE DATE / TIME: 202006181310
Unit Type and Rh: 6200
Unit Type and Rh: 6200

## 2018-08-17 LAB — CBC
HCT: 22.1 % — ABNORMAL LOW (ref 39.0–52.0)
Hemoglobin: 8.1 g/dL — ABNORMAL LOW (ref 13.0–17.0)
MCH: 31.9 pg (ref 26.0–34.0)
MCHC: 36.7 g/dL — ABNORMAL HIGH (ref 30.0–36.0)
MCV: 87 fL (ref 80.0–100.0)
Platelets: 132 10*3/uL — ABNORMAL LOW (ref 150–400)
RBC: 2.54 MIL/uL — ABNORMAL LOW (ref 4.22–5.81)
RDW: 18.3 % — ABNORMAL HIGH (ref 11.5–15.5)
WBC: 5.3 10*3/uL (ref 4.0–10.5)
nRBC: 0.4 % — ABNORMAL HIGH (ref 0.0–0.2)

## 2018-08-17 LAB — HEPATITIS PANEL, ACUTE
HCV Ab: 0.1 s/co ratio (ref 0.0–0.9)
Hep A IgM: NEGATIVE
Hep B C IgM: NEGATIVE
Hepatitis B Surface Ag: NEGATIVE

## 2018-08-17 LAB — PROTIME-INR
INR: 2.4 — ABNORMAL HIGH (ref 0.8–1.2)
Prothrombin Time: 25.6 seconds — ABNORMAL HIGH (ref 11.4–15.2)

## 2018-08-17 MED ORDER — SODIUM CHLORIDE 0.9 % IV SOLN
2.0000 g | INTRAVENOUS | Status: AC
  Administered 2018-08-17 – 2018-08-21 (×5): 2 g via INTRAVENOUS
  Filled 2018-08-17 (×5): qty 20

## 2018-08-17 MED ORDER — LACTULOSE 10 GM/15ML PO SOLN: 20.0000 g | Freq: Every day | ORAL | Status: DC

## 2018-08-17 MED ORDER — IPRATROPIUM-ALBUTEROL 0.5-2.5 (3) MG/3ML IN SOLN: 3.0000 mL | Freq: Four times a day (QID) | RESPIRATORY_TRACT | Status: DC | PRN

## 2018-08-17 MED ORDER — LACTULOSE 10 GM/15ML PO SOLN
30.0000 g | Freq: Three times a day (TID) | ORAL | Status: DC
  Administered 2018-08-17 – 2018-08-18 (×4): 30 g via ORAL
  Filled 2018-08-17 (×3): qty 45

## 2018-08-17 NOTE — Progress Notes (Signed)
CRITICAL VALUE ALERT  Critical Value:  Bilirubin of 34.4  Date & Time Notied: 08/17/18 0312  Provider Notified: X blount  Orders Received/Actions taken:  No orders.

## 2018-08-17 NOTE — Progress Notes (Signed)
[facility]    NEPHROLOGY PROGRESS NOTE  SUBJECTIVE: Complains of fatigue.  Denies any headaches, fevers, chills, chest pain, shortness of breath, nausea, vomiting, diarrhea or dysuria.  All other review of systems are negative.    OBJECTIVE:  Vitals:   08/17/18 0022 08/17/18 0403  BP: (!) 91/44 (!) 106/91  Pulse: 79 83  Resp: (!) 21 16  Temp: 97.6 F (36.4 C) 98 F (36.7 C)  SpO2: 97% 93%    Intake/Output Summary (Last 24 hours) at 08/17/2018 1544 Last data filed at 08/17/2018 0900 Gross per 24 hour  Intake 1728.03 ml  Output -  Net 1728.03 ml      General: Alert to self, no acute distress HEENT: MMM [place] AT anicteric sclera  neck:  No JVD, no adenopathy CV:  Heart RRR  Lungs:  L/S CTA bilaterally Abd: Distended, nontender with normal BS GU:  Bladder non-palpable Extremities: +3 bilateral lower extremity edema  skin:  No skin rash, positive jaundice  MEDICATIONS:  . folic acid  1 mg Oral Daily  . lactulose  30 g Oral TID  . magic mouthwash  10 mL Oral QID  . midodrine  10 mg Oral TID WC  . multivitamin with minerals  1 tablet Oral Daily  . pantoprazole (PROTONIX) IV  40 mg Intravenous Q12H  . prednisoLONE  40 mg Oral Daily  . thiamine  100 mg Oral Daily   Or  . thiamine  100 mg Intravenous Daily       LABS:   CBC Latest Ref Rng & Units 08/17/2018 08/16/2018 08/15/2018  WBC 4.0 - 10.5 K/uL 5.3 5.9 6.4  Hemoglobin 13.0 - 17.0 g/dL 8.1(L) 10.1(L) 10.5(L)  Hematocrit 39.0 - 52.0 % 22.1(L) 27.7(L) 28.7(L)  Platelets 150 - 400 K/uL 132(L) 140(L) 133(L)    CMP Latest Ref Rng & Units 08/17/2018 08/16/2018 08/15/2018  Glucose 70 - 99 mg/dL 78 89 83  BUN 6 - 20 mg/dL 41(H) 37(H) 32(H)  Creatinine 0.61 - 1.24 mg/dL 2.51(H) 2.78(H) 2.48(H)  Sodium 135 - 145 mmol/L 125(L) 123(L) 120(L)  Potassium 3.5 - 5.1 mmol/L 4.2 5.1 5.2(H)  Chloride 98 - 111 mmol/L 83(L) 90(L) 85(L)  CO2 22 - 32 mmol/L 27 20(L) 18(L)  Calcium 8.9 - 10.3 mg/dL 7.5(L) 7.9(L) 8.4(L)   Total Protein 6.5 - 8.1 g/dL 6.0(L) 6.0(L) 6.4(L)  Total Bilirubin 0.3 - 1.2 mg/dL 34.4(HH) 30.3(HH) 28.5(HH)  Alkaline Phos 38 - 126 U/L 158(H) 168(H) 189(H)  AST 15 - 41 U/L 189(H) 215(H) 226(H)  ALT 0 - 44 U/L 64(H) 60(H) 63(H)    Lab Results  Component Value Date   CALCIUM 7.5 (L) 08/17/2018   CAION 1.11 (L) 01/01/2018       Component Value Date/Time   COLORURINE AMBER (A) 08/15/2018 2212   APPEARANCEUR CLOUDY (A) 08/15/2018 2212   LABSPEC 1.017 08/15/2018 2212   PHURINE 5.0 08/15/2018 2212   GLUCOSEU 50 (A) 08/15/2018 2212   HGBUR LARGE (A) 08/15/2018 2212   BILIRUBINUR MODERATE (A) 08/15/2018 2212   KETONESUR 5 (A) 08/15/2018 2212   PROTEINUR 100 (A) 08/15/2018 2212   UROBILINOGEN 0.2 11/29/2009 0506   NITRITE NEGATIVE 08/15/2018 2212   LEUKOCYTESUR NEGATIVE 08/15/2018 2212      Component Value Date/Time   HCO3 29.8 (H) 03/25/2007 1915   TCO2 27 01/01/2018 1422       Component Value Date/Time   IRON 119 07/05/2011 1144   FERRITIN 39.7 07/05/2011 1144   IRONPCTSAT 22.6 07/05/2011 1144  ASSESSMENT/PLAN:    43 year old male patient with a past medical history significant for hypertension, depression, Barrett's esophagus, and heavy alcohol use who presented with a two-week history of nausea, vomiting, hematemesis, and right upper quadrant pain.  He was noted to have markedly elevated LFTs and a total bili of 28.5.  1.  Baseline serum creatinine 0.8 from 02/26/2018.  2.  Alcoholic hepatitis.  Day 2 of prednisolone.  3.  Acute kidney injury.  Likely secondary to hepatorenal syndrome.  Urine electrolytes are pending.  Patient appears markedly fluid overloaded.  Will discontinue IV fluids.  Continue octreotide, albumin, and midodrine.  4.  Acute liver failure.  Overall poor prognosis.    [address] Dunbar DriveNancy BoykinsFinnigan, DO, TennesseeFACP

## 2018-08-17 NOTE — Progress Notes (Addendum)
[facility]                                                                              Patient Demographics  Joseph Valenzuela, is a 43 y.o. male, DOB - August 10, 1975, AVW:[id number]  Admit date - 08/15/2018   Admitting Physician Carron Curie, MD  Outpatient Primary MD for the patient is Kristian Covey, MD  Outpatient specialists:   LOS - 2  days   Medical records reviewed and are as summarized below:    Chief Complaint  Patient presents with  . Diarrhea  . Jaundice       Brief summary   Patient is a 43 year old male with hypertension, depression, Barrett's esophagus, alcoholism presented with 2-week history of nausea, vomiting, hematemesis, right upper quadrant abdominal pain.  Patient has been drinking a lot, fifth almost every day.   In ED, patient was noted to be jaundiced, sodium 120, potassium 5.2, creatinine 2.48, hemoglobin 10.8.  Elevated LFTs with total bili of 28.5. GI was consulted. COVID-19 test negative  Assessment & Plan    Principal problem Acute upper GI bleed, acute blood loss anemia -Patient has a history of GERD, Barrett's esophagus, possible EtOH gastritis or portal hypertensive gastritis.  FOBT positive. 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  Evidence of portal venous hypertension with portal venous collateral splenomegaly and ascites.  Distended gallbladder, suggests sludge,  several gallstones. -GI following closely, started on IV ceftriaxone for 5 days, minimal ascites on the ultrasound however high risk of SBP and abdominal tenderness and acute alcoholic hepatitis -Continue lactulose, prednisolone  Coagulopathy: Secondary to liver cirrhosis -Received vitamin K, FFP, INR 2.4  Acute kidney injury:   Hepatorenal syndrome -Creatinine 2.4 at the time of admission, in 01/2018, creatinine was 0.8.  Despite IV fluids, worsening. -Hold lisinopril HCTZ -Nephrology following, creatinine slightly improving 2.5 today from 2.7 yesterday -Continue octreotide, midodrine  Hyponatremia with lactic acidosis Sodium 120 at the time of admission with hyperkalemia, patient was placed on IV fluids Sodium slightly better 125 today  History of alcohol abuse -Heavy drinker, placed on CIWA scale with Ativan, high risk of withdrawals -Continue thiamine, folate, MVI  Hypotension -BP still soft but more stable than yesterday.  Continue octreotide, midodrine  Code Status: Full CODE STATUS DVT Prophylaxis:   SCD's Family Communication: Discussed in detail with the patient, all imaging results, lab results explained to the patient    Disposition Plan: Continue stepdown status with high mortality, acute  alcoholic hepatitis, upper GI bleed, hypotensive, hepatorenal syndrome  Time Spent in minutes 35 minutes   Procedures:  EGD  Consultants:   Gastroenterology Nephrology CCM  Antimicrobials:   Anti-infectives (From admission, onward)  Start     Dose/Rate Route Frequency Ordered Stop   08/17/18 1200  cefTRIAXone (ROCEPHIN) 2 g in sodium chloride 0.9 % 100 mL IVPB     2 g 200 mL/hr over 30 Minutes Intravenous Every 24 hours 08/17/18 1145 08/22/18 1144         Medications  Scheduled Meds: . folic acid  1 mg Oral Daily  . ipratropium-albuterol  3 mL Nebulization BID  . lactulose  30 g Oral TID  . magic mouthwash  10 mL Oral QID  . midodrine  10 mg Oral TID WC  .  multivitamin with minerals  1 tablet Oral Daily  . pantoprazole (PROTONIX) IV  40 mg Intravenous Q12H  . prednisoLONE  40 mg Oral Daily  . thiamine  100 mg Oral Daily   Or  . thiamine  100 mg Intravenous Daily   Continuous Infusions: . sodium chloride Stopped (08/16/18 2145)  . albumin human 12.5 g (08/17/18 1031)  . cefTRIAXone (ROCEPHIN)  IV 2 g (08/17/18 1229)  . octreotide  (SANDOSTATIN)    IV infusion 50 mcg/hr (08/17/18 1227)  .  sodium bicarbonate (isotonic) infusion in sterile water 125 mL/hr at 08/17/18 0720   PRN Meds:.fentaNYL (SUBLIMAZE) injection, LORazepam **OR** LORazepam, ondansetron **OR** ondansetron (ZOFRAN) IV      Subjective:   Gilford RileBobby Shambley was seen and examined today.  But confused and lethargic today, no fevers or chills.  Difficult to obtain review of system from the patient today.  Still having pain in the right upper quadrant.  No nausea or vomiting.  Objective:   Vitals:   08/16/18 2114 08/16/18 2341 08/17/18 0022 08/17/18 0403  BP:   (!) 91/44 (!) 106/91  Pulse: 80  79 83  Resp: (!) 26  (!) 21 16  Temp:   97.6 F (36.4 C) 98 F (36.7 C)  TempSrc:   Oral Oral  SpO2: 94% 100% 97% 93%  Weight:      Height:        Intake/Output Summary (Last 24 hours) at 08/17/2018 1232 Last data filed at 08/17/2018 0900 Gross per 24 hour  Intake 2172.03 ml  Output -  Net 2172.03 ml     Wt Readings from Last 3 Encounters:  08/15/18 130.5 kg  05/18/18 130.5 kg  02/26/18 127.9 kg   Physical Exam  General: Somewhat lethargic but easily arousable, jaundiced, looks ill  Eyes: PERRLA, EOMI, icteric Sclera,  HEENT:  Atraumatic, normocephalic  Cardiovascular: S1 S2 clear, RRR. 2+  pedal edema b/l  Respiratory: Decreased breath sound at the bases  Gastrointestinal: Soft, diffuse TTP, worse in the right upper quadrant, NBS  Ext: 2+ pedal edema bilaterally  Neuro: no new deficits  Musculoskeletal: No cyanosis, clubbing  Skin: No rashes  Psych:  Flat affect   Data Reviewed:  I have personally reviewed following labs and imaging studies  Micro Results Recent Results (from the past 240 hour(s))  Novel Coronavirus,NAA,(SEND-OUT TO REF LAB - TAT 24-48 hrs); Hosp Order     Status: None   Collection Time: 08/15/18 10:31 AM   Specimen: Nasopharyngeal Swab; Respiratory  Result Value Ref Range Status   SARS-CoV-2, NAA NOT DETECTED NOT DETECTED Final    Comment: (NOTE) This test was developed and its performance characteristics determined by [facility]. This test has not been FDA cleared or approved. This test has been authorized by FDA under an Emergency Use Authorization (EUA). This test is only authorized for the duration of time the declaration that circumstances  exist justifying the authorization of the emergency use of in vitro diagnostic tests for detection of SARS-CoV-2 virus and/or diagnosis of COVID-19 infection under section 564(b)(1) of the Act, 21 U.S.C. 045WUJ-8(J)(1360bbb-3(b)(1), unless the authorization is terminated or revoked sooner. When diagnostic testing is negative, the possibility of a false negative result should be considered in the context of a patient's recent exposures and the presence of clinical signs and symptoms consistent with COVID-19. An individual without symptoms of COVID-19 and who is not shedding SARS-CoV-2 virus would expect to have a negative (not detected) result in this assay. Performed  At: [facility] [address] [place], [place] [id number] Jolene SchimkeNagendra Sanjai MD OZ:[id number]:[phone]    Coronavirus Source NASOPHARYNGEAL  Final    Comment: Performed at [facility], [address] [place], [place] [address]  SARS Coronavirus 2     Status: None   Collection Time: 08/15/18  2:19 PM  Result Value Ref Range Status   SARS Coronavirus 2 NOT DETECTED NOT DETECTED Final    Comment: (NOTE) SARS-CoV-2 target nucleic acids are NOT DETECTED. The SARS-CoV-2 RNA is generally detectable in  upper and lower respiratory specimens during the acute phase of infection.  Negative  results do not preclude SARS-CoV-2 infection, do not rule out co-infections with other pathogens, and should not be used as the sole basis for treatment or other patient management decisions.  Negative results must be combined with clinical observations, patient history, and epidemiological information. The expected result is Not Detected. Fact Sheet for Patients: http://www.biofiredefense.com/wp-content/uploads/2020/03/BIOFIRE-COVID -19-patients.pdf Fact Sheet for Healthcare Providers: http://www.biofiredefense.com/wp-content/uploads/2020/03/BIOFIRE-COVID -19-hcp.pdf This test is not yet approved or cleared by the [place] FDA and  has been authorized for detection and/or diagnosis of SARS-CoV-2 by FDA under an Emergency Use Authorization (EUA).  This EUA will remain in effec t (meaning this test can be used) for the duration of  the COVID-19 declaration under Section 564(b)(1) of the Act, 21 U.S.C. section 360bbb-3(b)(1), unless the authorization is terminated or revoked sooner. Performed at [facility], [address] [place], [place] [address]     Radiology Reports Ct Abdomen Pelvis Wo Contrast  Result Date: 08/15/2018 CLINICAL DATA:  Painless jaundice, fatigue and abdominal swelling. EXAM: CT ABDOMEN AND PELVIS WITHOUT CONTRAST TECHNIQUE: Multidetector CT imaging of the abdomen and pelvis was performed following the standard protocol without IV contrast. COMPARISON:  06/18/2013 FINDINGS: Lower chest: Small left pleural effusion noted with overlying atelectasis. The right lung base is clear. The heart is normal in size. No pericardial effusion. Marked eventration of the right hemidiaphragm with overlying atelectasis. Hepatobiliary: Hepatomegaly. The liver contour is irregular and the caudate lobe is prominent. The hepatic fissures are also slightly prominent. Findings consistent with  cirrhosis. There is also diffuse heterogeneous fatty infiltration and possible superimposed hepatitis. No obvious hepatic lesion. The gallbladder is distended and demonstrates high attenuation which could be sludge. There also several small calcified gallstones. The gallbladder wall is thickened but this could be due to ascites and low albumin. No intra or extrahepatic biliary dilatation to suggest biliary obstruction. Pancreas: No mass, inflammation or ductal dilatation. Spleen: Splenomegaly. Spleen measures 16 x 16 x 12.5 cm. No worrisome lesions. Adrenals/Urinary Tract: Adrenal glands and kidneys are unremarkable. No renal, ureteral or bladder calculi or obvious mass without contrast. Stomach/Bowel: The stomach, duodenum, small bowel and colon are grossly normal without oral contrast. No acute inflammatory changes, mass lesions or obstructive findings. Vascular/Lymphatic: Scattered atherosclerotic calcifications involving the aorta but no aneurysm. There are  numerous borderline upper abdominal lymph nodes typical with cirrhosis. Scattered retroperitoneal lymph nodes are also noted but no mass or overt adenopathy. Reproductive: The prostate gland and seminal vesicles are unremarkable. Other: Small volume abdominal/pelvic ascites. There is also diffuse mesenteric edema and diffuse body wall edema. Musculoskeletal: No significant bony findings. IMPRESSION: 1. CT findings consistent with cirrhosis and fatty infiltration of the liver. No focal hepatic lesions or evidence of biliary obstruction. There could be superimposed hepatitis. 2. Evidence of portal venous hypertension with portal venous collaterals, splenomegaly and ascites. 3. Distended gallbladder with high attenuation material which could suggest sludge. Several gallstones are also noted. Gallbladder wall thickening likely due to ascites and low albumin. 4. Small left pleural effusion with overlying atelectasis. 5. Diffuse body wall edema. Electronically  Signed   By: Rudie MeyerP.  Gallerani M.D.   On: 08/15/2018 11:53   [place] Renal  Result Date: 08/16/2018 CLINICAL DATA:  Initial evaluation for ascites. EXAM: RENAL / URINARY TRACT ULTRASOUND COMPLETE COMPARISON:  None available FINDINGS: Right Kidney: Renal measurements: 12.5 x 5.5 x 6.1 cm = volume: 216.9 mL . Echogenicity within normal limits. No mass or hydronephrosis visualized. Left Kidney: Renal measurements: 13.6 x 6.2 x 7.1 cm = volume: 309.8 mL. Echogenicity within normal limits. No mass or hydronephrosis visualized. Bladder: Appears normal for degree of bladder distention. IMPRESSION: Normal renal ultrasound. No evidence for hydronephrosis or other acute finding. Electronically Signed   By: Rise MuBenjamin  McClintock M.D.   On: 08/16/2018 20:11   [place] Abdomen Limited  Result Date: 08/16/2018 CLINICAL DATA:  43 year old male with abdominal distension. Evaluate for ascites. EXAM: LIMITED ABDOMEN ULTRASOUND FOR ASCITES TECHNIQUE: Limited ultrasound survey for ascites was performed in all four abdominal quadrants. COMPARISON:  None. FINDINGS: A small amount of ascites is noted adjacent to the liver. A tiny amount ascites is noted pelvis. IMPRESSION: Small amount of ascites, primarily along the liver. Electronically Signed   By: Harmon PierJeffrey  Hu M.D.   On: 08/16/2018 12:18   Dg Chest Portable 1 View  Result Date: 08/15/2018 CLINICAL DATA:  Anemia.  Vomiting. EXAM: PORTABLE CHEST 1 VIEW COMPARISON:  January 01, 2018 FINDINGS: Minimal opacity in the lateral left lung base. There is an elevated right hemidiaphragm. There is also opacity in the right lung base. The cardiomediastinal silhouette is normal. IMPRESSION: 1. The opacity in the right lung base may represent a pleural effusion and atelectasis. Infiltrate not excluded. 2. Mild opacity in the lateral left lung base favored represent atelectasis. Subtle infiltrate not excluded. Electronically Signed   By: Gerome Samavid  Williams III M.D   On: 08/15/2018 10:47    Lab Data:   CBC: Recent Labs  Lab 08/15/18 0915 08/15/18 2033 08/16/18 0324 08/17/18 0357  WBC 8.2 6.4 5.9 5.3  NEUTROABS 6.1 4.9  --   --   HGB 10.8* 10.5* 10.1* 8.1*  HCT 30.4* 28.7* 27.7* 22.1*  MCV 88.6 87.8 88.2 87.0  PLT 170 133* 140* 132*   Basic Metabolic Panel: Recent Labs  Lab 08/15/18 0915 08/15/18 0930 08/16/18 0324 08/17/18 0357  NA 120*  --  123* 125*  K 5.2*  --  5.1 4.2  CL 85*  --  90* 83*  CO2 18*  --  20* 27  GLUCOSE 83  --  89 78  BUN 32*  --  37* 41*  CREATININE 2.48*  --  2.78* 2.51*  CALCIUM 8.4*  --  7.9* 7.5*  MG  --  1.6*  --   --    GFR:  Estimated Creatinine Clearance: 51.8 mL/min (A) (by C-G formula based on SCr of 2.51 mg/dL (H)). Liver Function Tests: Recent Labs  Lab 08/15/18 0915 08/16/18 0324 08/17/18 0357  AST 226* 215* 189*  ALT 63* 60* 64*  ALKPHOS 189* 168* 158*  BILITOT 28.5* 30.3* 34.4*  PROT 6.4* 6.0* 6.0*  ALBUMIN 2.4* 2.2* 2.5*   Recent Labs  Lab 08/15/18 0915  LIPASE 102*   Recent Labs  Lab 08/15/18 0915  AMMONIA 49*   Coagulation Profile: Recent Labs  Lab 08/15/18 0915 08/16/18 0324 08/17/18 0357  INR 2.2* 2.5* 2.4*   Cardiac Enzymes: Recent Labs  Lab 08/15/18 0915  TROPONINI <0.03   BNP (last 3 results) No results for input(s): PROBNP in the last 8760 hours. HbA1C: No results for input(s): HGBA1C in the last 72 hours. CBG: No results for input(s): GLUCAP in the last 168 hours. Lipid Profile: No results for input(s): CHOL, HDL, LDLCALC, TRIG, CHOLHDL, LDLDIRECT in the last 72 hours. Thyroid Function Tests: No results for input(s): TSH, T4TOTAL, FREET4, T3FREE, THYROIDAB in the last 72 hours. Anemia Panel: No results for input(s): VITAMINB12, FOLATE, FERRITIN, TIBC, IRON, RETICCTPCT in the last 72 hours. Urine analysis:    Component Value Date/Time   COLORURINE AMBER (A) 08/15/2018 2212   APPEARANCEUR CLOUDY (A) 08/15/2018 2212   LABSPEC 1.017 08/15/2018 2212   PHURINE 5.0 08/15/2018 2212    GLUCOSEU 50 (A) 08/15/2018 2212   HGBUR LARGE (A) 08/15/2018 2212   BILIRUBINUR MODERATE (A) 08/15/2018 2212   KETONESUR 5 (A) 08/15/2018 2212   PROTEINUR 100 (A) 08/15/2018 2212   UROBILINOGEN 0.2 11/29/2009 0506   NITRITE NEGATIVE 08/15/2018 2212   LEUKOCYTESUR NEGATIVE 08/15/2018 2212     Ripudeep Rai M.D. [facility] 08/17/2018, 12:32 PM  Pager: [phone] Between 7am to 7pm - call Pager - [phone]  After 7pm go to [email or web address] - password [email or web address]  Call night coverage person covering after 7pm

## 2018-08-17 NOTE — Progress Notes (Addendum)
Daily Rounding Note  08/17/2018, 8:30 AM  LOS: 2 days   SUBJECTIVE:   Chief complaint: decompensated ETOH cirrhosis and ETOH hepatitis.   Hematemesis.    Feels awful, has pain in the right upper quadrant.  No appetite but no nausea or vomiting.  No BM since PTA.  Still seeing blood in his urine.  Urine output recorded at 200 ml yesterday but he tells me he had accident and peed on floor which was not recorded.  No output recorded yet today.   T bili to 34.4 Hypotension persists, though his blood pressure reading was 106/91.  Generally has been in the 90s/40s.  No tachycardia..     ROS:  Patient cannot give add'l ROS due to altered mental status.  OBJECTIVE:         Vital signs in last 24 hours:    Temp:  [97.3 F (36.3 C)-98.9 F (37.2 C)] 98 F (36.7 C) (06/19 0403) Pulse Rate:  [74-87] 83 (06/19 0403) Resp:  [14-26] 16 (06/19 0403) BP: (74-126)/(25-91) 106/91 (06/19 0403) SpO2:  [93 %-100 %] 93 % (06/19 0403) Last BM Date: 08/17/18 Filed Weights   08/15/18 1237  Weight: 130.5 kg   General: Jaundiced, looks acutely ill. lethargic Heart: RRR. Chest: Clear bilaterally.  No dyspnea but takes shallow breaths.  No cough. Abdomen: Obese.  Scattered abdominal tenderness without guarding or rebound.  Bowel sounds active. GU: visible, small amount of red blood at glans penis.   Extremities: 2 -3 + pitting edema in the lower legs and feet.  Mottled erythema in the lower legs. Neuro/Psych: Oriented x3.  Appropriate.  Mentation very slow.  No asterixis or myoclonus.  Intake/Output from previous day: 06/18 0701 - 06/19 0700 In: 2370.9 [I.V.:1960; Blood:94; IV Piggyback:316.9] Out: -   Intake/Output this shift: No intake/output data recorded.  Lab Results: Recent Labs    08/15/18 2033 08/16/18 0324 08/17/18 0357  WBC 6.4 5.9 5.3  HGB 10.5* 10.1* 8.1*  HCT 28.7* 27.7* 22.1*  PLT 133* 140* 132*   BMET Recent Labs     08/15/18 0915 08/16/18 0324 08/17/18 0357  NA 120* 123* 125*  K 5.2* 5.1 4.2  CL 85* 90* 83*  CO2 18* 20* 27  GLUCOSE 83 89 78  BUN 32* 37* 41*  CREATININE 2.48* 2.78* 2.51*  CALCIUM 8.4* 7.9* 7.5*   LFT Recent Labs    08/15/18 0915 08/16/18 0324 08/17/18 0357  PROT 6.4* 6.0* 6.0*  ALBUMIN 2.4* 2.2* 2.5*  AST 226* 215* 189*  ALT 63* 60* 64*  ALKPHOS 189* 168* 158*  BILITOT 28.5* 30.3* 34.4*  BILIDIR  --  20.2*  --   IBILI  --  NOT CALCULATED  --    PT/INR Recent Labs    08/16/18 0324 08/17/18 0357  LABPROT 26.5* 25.6*  INR 2.5* 2.4*   Hepatitis Panel No results for input(s): HEPBSAG, HCVAB, HEPAIGM, HEPBIGM in the last 72 hours.  Studies/Results: Ct Abdomen Pelvis Wo Contrast  Result Date: 08/15/2018 CLINICAL DATA:  Painless jaundice, fatigue and abdominal swelling. EXAM: CT ABDOMEN AND PELVIS WITHOUT CONTRAST TECHNIQUE: Multidetector CT imaging of the abdomen and pelvis was performed following the standard protocol without IV contrast. COMPARISON:  06/18/2013 FINDINGS: Lower chest: Small left pleural effusion noted with overlying atelectasis. The right lung base is clear. The heart is normal in size. No pericardial effusion. Marked eventration of the right hemidiaphragm with overlying atelectasis. Hepatobiliary: Hepatomegaly. The liver contour is irregular  and the caudate lobe is prominent. The hepatic fissures are also slightly prominent. Findings consistent with cirrhosis. There is also diffuse heterogeneous fatty infiltration and possible superimposed hepatitis. No obvious hepatic lesion. The gallbladder is distended and demonstrates high attenuation which could be sludge. There also several small calcified gallstones. The gallbladder wall is thickened but this could be due to ascites and low albumin. No intra or extrahepatic biliary dilatation to suggest biliary obstruction. Pancreas: No mass, inflammation or ductal dilatation. Spleen: Splenomegaly. Spleen measures  16 x 16 x 12.5 cm. No worrisome lesions. Adrenals/Urinary Tract: Adrenal glands and kidneys are unremarkable. No renal, ureteral or bladder calculi or obvious mass without contrast. Stomach/Bowel: The stomach, duodenum, small bowel and colon are grossly normal without oral contrast. No acute inflammatory changes, mass lesions or obstructive findings. Vascular/Lymphatic: Scattered atherosclerotic calcifications involving the aorta but no aneurysm. There are numerous borderline upper abdominal lymph nodes typical with cirrhosis. Scattered retroperitoneal lymph nodes are also noted but no mass or overt adenopathy. Reproductive: The prostate gland and seminal vesicles are unremarkable. Other: Small volume abdominal/pelvic ascites. There is also diffuse mesenteric edema and diffuse body wall edema. Musculoskeletal: No significant bony findings. IMPRESSION: 1. CT findings consistent with cirrhosis and fatty infiltration of the liver. No focal hepatic lesions or evidence of biliary obstruction. There could be superimposed hepatitis. 2. Evidence of portal venous hypertension with portal venous collaterals, splenomegaly and ascites. 3. Distended gallbladder with high attenuation material which could suggest sludge. Several gallstones are also noted. Gallbladder wall thickening likely due to ascites and low albumin. 4. Small left pleural effusion with overlying atelectasis. 5. Diffuse body wall edema. Electronically Signed   By: Rudie MeyerP.  Gallerani M.D.   On: 08/15/2018 11:53   [place] Renal  Result Date: 08/16/2018 CLINICAL DATA:  Initial evaluation for ascites. EXAM: RENAL / URINARY TRACT ULTRASOUND COMPLETE COMPARISON:  None available FINDINGS: Right Kidney: Renal measurements: 12.5 x 5.5 x 6.1 cm = volume: 216.9 mL . Echogenicity within normal limits. No mass or hydronephrosis visualized. Left Kidney: Renal measurements: 13.6 x 6.2 x 7.1 cm = volume: 309.8 mL. Echogenicity within normal limits. No mass or hydronephrosis  visualized. Bladder: Appears normal for degree of bladder distention. IMPRESSION: Normal renal ultrasound. No evidence for hydronephrosis or other acute finding. Electronically Signed   By: Rise MuBenjamin  McClintock M.D.   On: 08/16/2018 20:11   [place] Abdomen Limited  Result Date: 08/16/2018 CLINICAL DATA:  43 year old male with abdominal distension. Evaluate for ascites. EXAM: LIMITED ABDOMEN ULTRASOUND FOR ASCITES TECHNIQUE: Limited ultrasound survey for ascites was performed in all four abdominal quadrants. COMPARISON:  None. FINDINGS: A small amount of ascites is noted adjacent to the liver. A tiny amount ascites is noted pelvis. IMPRESSION: Small amount of ascites, primarily along the liver. Electronically Signed   By: Harmon PierJeffrey  Hu M.D.   On: 08/16/2018 12:18   Dg Chest Portable 1 View  Result Date: 08/15/2018 CLINICAL DATA:  Anemia.  Vomiting. EXAM: PORTABLE CHEST 1 VIEW COMPARISON:  January 01, 2018 FINDINGS: Minimal opacity in the lateral left lung base. There is an elevated right hemidiaphragm. There is also opacity in the right lung base. The cardiomediastinal silhouette is normal. IMPRESSION: 1. The opacity in the right lung base may represent a pleural effusion and atelectasis. Infiltrate not excluded. 2. Mild opacity in the lateral left lung base favored represent atelectasis. Subtle infiltrate not excluded. Electronically Signed   By: Gerome Samavid  Williams III M.D   On: 08/15/2018 10:47   Scheduled Meds:  folic  acid  1 mg Oral Daily   ipratropium-albuterol  3 mL Nebulization BID   magic mouthwash  10 mL Oral QID   midodrine  10 mg Oral TID WC   multivitamin with minerals  1 tablet Oral Daily   pantoprazole (PROTONIX) IV  40 mg Intravenous Q12H   prednisoLONE  40 mg Oral Daily   thiamine  100 mg Oral Daily   Or   thiamine  100 mg Intravenous Daily   Continuous Infusions:  sodium chloride Stopped (08/16/18 2145)   albumin human 60 mL/hr at 08/16/18 2325   octreotide  (SANDOSTATIN)     IV infusion 50 mcg/hr (08/17/18 0242)    sodium bicarbonate (isotonic) infusion in sterile water 125 mL/hr at 08/17/18 0720   PRN Meds:.fentaNYL (SUBLIMAZE) injection, LORazepam **OR** LORazepam, ondansetron **OR** ondansetron (ZOFRAN) IV   ASSESMENT:   *   Hematemesis. Resolved.    6/18 EGD: banding with eradication of grade 2, mildly bleeding esoph varices.  White plaques in esophagus, likley candidiasis, not bx'd due to coagulopathy.  Severe portal hypertensive gastropathy.   First 24 of 72 hours octreotide drip in place.  Day 3 Protonix 40 IV BID.   Day 2 magic mouthwash.    *   Cirrhosis of liver.  ETOH hepatitis. Disc fx of 69 at admission.  Day 2 oral Prednisolone.  T bili worse, other LFTs improved.    *   Coagulopathy, no signif response to vit K IV past 2 days.  S/p FFP x 2 6/18.    *   Blood loss anemia.  Hgb 10.1 >> 8.1 in last 24 hours.    *   Gross hematuria.    *   Thrombocytopenia, splenomegaly.    *   Hyponatremia.    *   AKI.  Improved in last 24 hours.  On Midodrine, octreotide, TID Albumin infusion.  Urine Na, creat, repeat U/A not yet collected.     *   Hematuria.    *   Alcoholism.  Ciwa in place.      PLAN   *   Adding Lactulose for impending HE.   *   Continue all current measures.  Complete 72 hours of Octreotide drip, and further continue if Renal recommends continuing.    *   Soft diet.    *  Pt confirms that his family contact go to is brother Priscille HeidelbergZach Jones.  He says he does not want family to know he has been drinking ETOH, he thinks they believe he has been sober.  I encouraged him to be honest and share ETOH abuse hx with family, as it will be necessary to inform them of this if pt unable to make medical decisions for himself.      Jennye MoccasinSarah Gribbin  08/17/2018, 8:30 AM Phone [phone]  I have discussed the case with the PA, and that is the plan I formulated. I personally interviewed and examined the patient. Esophageal variceal hemorrhage,  treated endoscopically Acute blood loss anemia Acute alcoholic hepatitis on top of alcoholic cirrhosis with mild ascites. Jaundice Hepatic encephalopathy Coagulopathy  This patient is very ill, and at high risk of further decompensation.  His creatinine is still elevated, though fortunately stable from yesterday on the current regimen of IV albumin, octreotide and midodrine.  Assistance from the nephrology consultant is greatly appreciated. While his INR is elevated, it is at least stable from yesterday.  Unfortunately, his bilirubin continues to rise.  He is also clinically  encephalopathic today.  This patient has severe acute alcoholic hepatitis on top of alcohol-related cirrhosis.  SBP seems unlikely given the reported minimal ascites seen on ultrasound.  However, given his abdominal pain and tenderness and severe decompensation, I have elected to put him on 2 g of ceftriaxone daily for 5 days.  Lactulose was ordered earlier, but I have increased it to 30 g by mouth 3 times daily.  He is at risk of deteriorating into hepatic coma unless this is treated aggressively.  We have coordinated this with the nursing to give first dose now.  Fortunately, he does not appear to have ongoing variceal bleeding since the banding yesterday.  Treatment course with octreotide and PPI were previously outlined and ongoing.  Dr. Fuller Plan will follow this patient through the weekend.  Total time 35 minutes, extensive chart review and care coordination required.  Nelida Meuse III Office: [phone]

## 2018-08-18 LAB — CBC
HCT: 25.6 % — ABNORMAL LOW (ref 39.0–52.0)
Hemoglobin: 9.4 g/dL — ABNORMAL LOW (ref 13.0–17.0)
MCH: 32.5 pg (ref 26.0–34.0)
MCHC: 36.7 g/dL — ABNORMAL HIGH (ref 30.0–36.0)
MCV: 88.6 fL (ref 80.0–100.0)
Platelets: 123 10*3/uL — ABNORMAL LOW (ref 150–400)
RBC: 2.89 MIL/uL — ABNORMAL LOW (ref 4.22–5.81)
RDW: 19.1 % — ABNORMAL HIGH (ref 11.5–15.5)
WBC: 5.4 10*3/uL (ref 4.0–10.5)
nRBC: 0 % (ref 0.0–0.2)

## 2018-08-18 LAB — COMPREHENSIVE METABOLIC PANEL
ALT: 73 U/L — ABNORMAL HIGH (ref 0–44)
AST: 161 U/L — ABNORMAL HIGH (ref 15–41)
Albumin: 2.6 g/dL — ABNORMAL LOW (ref 3.5–5.0)
Alkaline Phosphatase: 161 U/L — ABNORMAL HIGH (ref 38–126)
Anion gap: 13 (ref 5–15)
BUN: 48 mg/dL — ABNORMAL HIGH (ref 6–20)
CO2: 23 mmol/L (ref 22–32)
Calcium: 8.4 mg/dL — ABNORMAL LOW (ref 8.9–10.3)
Chloride: 89 mmol/L — ABNORMAL LOW (ref 98–111)
Creatinine, Ser: 2.25 mg/dL — ABNORMAL HIGH (ref 0.61–1.24)
GFR calc Af Amer: 40 mL/min — ABNORMAL LOW (ref >60)
GFR calc non Af Amer: 35 mL/min — ABNORMAL LOW (ref >60)
Glucose, Bld: 155 mg/dL — ABNORMAL HIGH (ref 70–99)
Potassium: 4.1 mmol/L (ref 3.5–5.1)
Sodium: 125 mmol/L — ABNORMAL LOW (ref 135–145)
Total Bilirubin: 39.2 mg/dL (ref 0.3–1.2)
Total Protein: 6.5 g/dL (ref 6.5–8.1)

## 2018-08-18 LAB — GLUCOSE, CAPILLARY: Glucose-Capillary: 139 mg/dL — ABNORMAL HIGH (ref 70–99)

## 2018-08-18 LAB — CREATININE, URINE, RANDOM: Creatinine, Urine: 137.95 mg/dL

## 2018-08-18 LAB — PROTIME-INR
INR: 2 — ABNORMAL HIGH (ref 0.8–1.2)
Prothrombin Time: 22.6 seconds — ABNORMAL HIGH (ref 11.4–15.2)

## 2018-08-18 LAB — SODIUM, URINE, RANDOM: Sodium, Ur: <10 mmol/L

## 2018-08-18 LAB — AMMONIA: Ammonia: 71 umol/L — ABNORMAL HIGH (ref 9–35)

## 2018-08-18 LAB — CHLORIDE, URINE, RANDOM: Chloride Urine: <15 mmol/L

## 2018-08-18 LAB — BILIRUBIN, DIRECT: Bilirubin, Direct: 25.7 mg/dL — ABNORMAL HIGH (ref 0.0–0.2)

## 2018-08-18 MED ORDER — LACTULOSE 10 GM/15ML PO SOLN
20.0000 g | Freq: Three times a day (TID) | ORAL | Status: DC
  Administered 2018-08-18: 10 g via ORAL
  Administered 2018-08-18 – 2018-08-20 (×5): 20 g via ORAL
  Filled 2018-08-18 (×5): qty 30

## 2018-08-18 NOTE — Progress Notes (Signed)
[facility]    NEPHROLOGY PROGRESS NOTE  SUBJECTIVE: Seen and examined.  Complains of fatigue and dizziness.  Denies any headaches, fevers, chills, chest pain, shortness of breath, nausea, vomiting, diarrhea or dysuria.  All other review of systems are negative.    OBJECTIVE:  Vitals:   08/17/18 2133 08/18/18 0440  BP:    Pulse:    Resp:    Temp: 97.8 F (36.6 C) 98 F (36.7 C)  SpO2:      Intake/Output Summary (Last 24 hours) at 08/18/2018 1238 Last data filed at 08/17/2018 1726 Gross per 24 hour  Intake 1428.45 ml  Output 450 ml  Net 978.45 ml      General: Alert to self, no acute distress HEENT: MMM [place] AT anicteric sclera  neck:  No JVD, no adenopathy CV:  Heart RRR  Lungs:  L/S CTA bilaterally Abd: Distended, nontender with normal BS GU:  Bladder non-palpable Extremities: +3 bilateral lower extremity edema  skin:  No skin rash, positive jaundice  MEDICATIONS:  . folic acid  1 mg Oral Daily  . lactulose  20 g Oral TID  . magic mouthwash  10 mL Oral QID  . midodrine  10 mg Oral TID WC  . multivitamin with minerals  1 tablet Oral Daily  . pantoprazole (PROTONIX) IV  40 mg Intravenous Q12H  . prednisoLONE  40 mg Oral Daily  . thiamine  100 mg Oral Daily   Or  . thiamine  100 mg Intravenous Daily       LABS:   CBC Latest Ref Rng & Units 08/18/2018 08/17/2018 08/16/2018  WBC 4.0 - 10.5 K/uL 5.4 5.3 5.9  Hemoglobin 13.0 - 17.0 g/dL 1.6(X9.4(L) 8.1(L) 10.1(L)  Hematocrit 39.0 - 52.0 % 25.6(L) 22.1(L) 27.7(L)  Platelets 150 - 400 K/uL 123(L) 132(L) 140(L)    CMP Latest Ref Rng & Units 08/18/2018 08/17/2018 08/16/2018  Glucose 70 - 99 mg/dL 096(E155(H) 78 89  BUN 6 - 20 mg/dL 45(W48(H) 09(W41(H) 11(B37(H)  Creatinine 0.61 - 1.24 mg/dL 1.47(W2.25(H) 2.95(A2.51(H) 2.13(Y2.78(H)  Sodium 135 - 145 mmol/L 125(L) 125(L) 123(L)  Potassium 3.5 - 5.1 mmol/L 4.1 4.2 5.1  Chloride 98 - 111 mmol/L 89(L) 83(L) 90(L)  CO2 22 - 32 mmol/L 23 27 20(L)  Calcium 8.9 - 10.3 mg/dL 8.6(V8.4(L) 7.5(L) 7.9(L)   Total Protein 6.5 - 8.1 g/dL 6.5 6.0(L) 6.0(L)  Total Bilirubin 0.3 - 1.2 mg/dL 39.2(HH) 34.4(HH) 30.3(HH)  Alkaline Phos 38 - 126 U/L 161(H) 158(H) 168(H)  AST 15 - 41 U/L 161(H) 189(H) 215(H)  ALT 0 - 44 U/L 73(H) 64(H) 60(H)    Lab Results  Component Value Date   CALCIUM 8.4 (L) 08/18/2018   CAION 1.11 (L) 01/01/2018       Component Value Date/Time   COLORURINE AMBER (A) 08/15/2018 2212   APPEARANCEUR CLOUDY (A) 08/15/2018 2212   LABSPEC 1.017 08/15/2018 2212   PHURINE 5.0 08/15/2018 2212   GLUCOSEU 50 (A) 08/15/2018 2212   HGBUR LARGE (A) 08/15/2018 2212   BILIRUBINUR MODERATE (A) 08/15/2018 2212   KETONESUR 5 (A) 08/15/2018 2212   PROTEINUR 100 (A) 08/15/2018 2212   UROBILINOGEN 0.2 11/29/2009 0506   NITRITE NEGATIVE 08/15/2018 2212   LEUKOCYTESUR NEGATIVE 08/15/2018 2212      Component Value Date/Time   HCO3 29.8 (H) 03/25/2007 1915   TCO2 27 01/01/2018 1422       Component Value Date/Time   IRON 119 07/05/2011 1144   FERRITIN 39.7 07/05/2011 1144   IRONPCTSAT 22.6  07/05/2011 1144       ASSESSMENT/PLAN:    43 year old male patient with a past medical history significant for hypertension, depression, Barrett's esophagus, and heavy alcohol use who presented with a two-week history of nausea, vomiting, hematemesis, and right upper quadrant pain.  He was noted to have markedly elevated LFTs and a total bili of 28.5.  1.  Baseline serum creatinine 0.8 from 16/60/6301.  2.  Alcoholic hepatitis.  Day 2 of prednisolone.  3.  Acute kidney injury.  Likely secondary to hepatorenal syndrome with markedly low urine sodium.   Patient appears markedly fluid overloaded.  Will maintain off of IV fluids.  Creatinine is improving.  Continue octreotide, albumin, and midodrine.  4.  Acute liver failure.  Overall poor prognosis.  Bilirubin continues to increase.    [place], DO, [place]

## 2018-08-18 NOTE — Progress Notes (Signed)
[facility]                                                                              Patient Demographics  Joseph Valenzuela, is a 43 y.o. male, DOB - 05/18/1975, UEA:[id number]  Admit date - 08/15/2018   Admitting Physician Carron CurieAli Hijazi, MD  Outpatient Primary MD for the patient is Kristian CoveyBurchette, Bruce W, MD  Outpatient specialists:   LOS - 3  days   Medical records reviewed and are as summarized below:    Chief Complaint  Patient presents with  . Diarrhea  . Jaundice       Brief summary   Patient is a 43 year old male with hypertension, depression, Barrett's esophagus, alcoholism presented with 2-week history of nausea, vomiting, hematemesis, right upper quadrant abdominal pain.  Patient has been drinking a lot, fifth almost every day.   In ED, patient was noted to be jaundiced, sodium 120, potassium 5.2, creatinine 2.48, hemoglobin 10.8.  Elevated LFTs with total bili of 28.5. GI was consulted. COVID-19 test negative  Assessment & Plan    Principal problem Acute upper GI bleed, acute blood loss anemia -Patient has a history of GERD, Barrett's esophagus, possible EtOH gastritis or portal hypertensive gastritis.  FOBT positive. 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  Evidence of portal venous hypertension with portal venous collateral splenomegaly and ascites.  Distended  gallbladder, suggests sludge, several gallstones. -GI following closely, started on IV ceftriaxone for 5 days, minimal ascites on the ultrasound however high risk of SBP and abdominal tenderness and acute alcoholic hepatitis -Continue prednisolone -Now having diarrhea, reduce lactulose dose  Coagulopathy: Secondary to liver cirrhosis -Received vitamin K, FFP, INR 0.0  Acute kidney injury:   Hepatorenal syndrome -Creatinine 2.4 at the time of admission, in 01/2018, creatinine was 0.8.  Despite IV fluids, worsening.  Creatinine improving -Continue to hold lisinopril, HCTZ -Nephrology following -Continue octreotide, midodrine  Hyponatremia with lactic acidosis Sodium 120 at the time of admission with hyperkalemia, patient was placed on IV fluids Sodium stable at 125  History of alcohol abuse -Heavy drinker, placed on CIWA scale with Ativan, high risk of withdrawals -Continue thiamine, folate, MVI  Hypotension -BP soft, continue octreotide, midodrine Code Status: Full CODE STATUS DVT Prophylaxis:   SCD's Family Communication: Discussed in detail with the patient, all imaging results, lab results explained to the patient    Disposition Plan: Continue stepdown status with high mortality, acute  alcoholic hepatitis, upper GI bleed, hypotensive, hepatorenal syndrome  Time Spent in minutes 25 minutes  Procedures:  EGD  Consultants:   Gastroenterology Nephrology CCM  Antimicrobials:   Anti-infectives (From admission, onward)   Start  Dose/Rate Route Frequency Ordered Stop   08/17/18 1200  cefTRIAXone (ROCEPHIN) 2 g in sodium chloride 0.9 % 100 mL IVPB     2 g 200 mL/hr over 30 Minutes Intravenous Every 24 hours 08/17/18 1145 08/22/18 1144         Medications  Scheduled Meds: . folic acid  1 mg Oral Daily  . lactulose  20 g Oral TID  . magic mouthwash  10 mL Oral QID  . midodrine  10 mg Oral TID WC  . multivitamin with minerals  1 tablet Oral Daily  .  pantoprazole (PROTONIX) IV  40 mg Intravenous Q12H  . prednisoLONE  40 mg Oral Daily  . thiamine  100 mg Oral Daily   Or  . thiamine  100 mg Intravenous Daily   Continuous Infusions: . albumin human 12.5 g (08/18/18 0935)  . cefTRIAXone (ROCEPHIN)  IV 2 g (08/18/18 1328)  . octreotide  (SANDOSTATIN)    IV infusion 50 mcg/hr (08/17/18 1227)   PRN Meds:.fentaNYL (SUBLIMAZE) injection, ipratropium-albuterol, ondansetron **OR** ondansetron (ZOFRAN) IV      Subjective:   Joseph Valenzuela was seen and examined today.  Feels miserable due to diarrhea, otherwise alert and oriented.  No fevers or chills.  Pain in the right upper quadrant 4/5, no radiation.  No nausea or vomiting.  Objective:   Vitals:   08/17/18 0403 08/17/18 2133 08/18/18 0440 08/18/18 1334  BP: (!) 106/91     Pulse: 83   70  Resp: 16     Temp: 98 F (36.7 C) 97.8 F (36.6 C) 98 F (36.7 C) 98.9 F (37.2 C)  TempSrc: Oral Oral Oral Oral  SpO2: 93%   98%  Weight:      Height:        Intake/Output Summary (Last 24 hours) at 08/18/2018 1417 Last data filed at 08/17/2018 1726 Gross per 24 hour  Intake 1308.45 ml  Output -  Net 1308.45 ml     Wt Readings from Last 3 Encounters:  08/15/18 130.5 kg  05/18/18 130.5 kg  02/26/18 127.9 kg   Physical Exam  General: Alert and oriented x 3, NAD, jaundice  Eyes: icteric Sclera,  HEENT:  Atraumatic, normocephalic  Cardiovascular: S1-S2 clear, RRR  Respiratory: Decreased breath sound at the bases  Gastrointestinal: Soft, mild diffuse TTP, nondistended, NBS  Ext: 2+ pedal edema bilaterally  Neuro: no new deficits  Musculoskeletal: No cyanosis, clubbing  Skin: No rashes  Psych: Normal affect and demeanor, alert and oriented x3    Data Reviewed:  I have personally reviewed following labs and imaging studies  Micro Results Recent Results (from the past 240 hour(s))  Novel Coronavirus,NAA,(SEND-OUT TO REF LAB - TAT 24-48 hrs); Hosp Order     Status:  None   Collection Time: 08/15/18 10:31 AM   Specimen: Nasopharyngeal Swab; Respiratory  Result Value Ref Range Status   SARS-CoV-2, NAA NOT DETECTED NOT DETECTED Final    Comment: (NOTE) This test was developed and its performance characteristics determined by [facility]. This test has not been FDA cleared or approved. This test has been authorized by FDA under an Emergency Use Authorization (EUA). This test is only authorized for the duration of time the declaration that circumstances exist justifying the authorization of the emergency use of in vitro diagnostic tests for detection of SARS-CoV-2 virus and/or diagnosis of COVID-19 infection under section 564(b)(1) of the Act, 21 U.S.C. 161WRU-0(A)(5360bbb-3(b)(1), unless the authorization is terminated or revoked sooner. When diagnostic testing is negative,  the possibility of a false negative result should be considered in the context of a patient's recent exposures and the presence of clinical signs and symptoms consistent with COVID-19. An individual without symptoms of COVID-19 and who is not shedding SARS-CoV-2 virus would expect to have a negative (not detected) result in this assay. Performed  At: [facility] [address] [place], [place] [id number] Rush Farmer MD NW:[id number]    [place]  Final    Comment: Performed at [facility], [place] [address], [place], [place] [address]  SARS Coronavirus 2     Status: None   Collection Time: 08/15/18  2:19 PM  Result Value Ref Range Status   SARS Coronavirus 2 NOT DETECTED NOT DETECTED Final    Comment: (NOTE) SARS-CoV-2 target nucleic acids are NOT DETECTED. The SARS-CoV-2 RNA is generally detectable in upper and lower respiratory specimens during the acute phase of infection.  Negative  results do not preclude SARS-CoV-2 infection, do not rule out co-infections with other pathogens, and should not be used as the sole basis for treatment or  other patient management decisions.  Negative results must be combined with clinical observations, patient history, and epidemiological information. The expected result is Not Detected. Fact Sheet for Patients: http://www.biofiredefense.com/wp-content/uploads/2020/03/BIOFIRE-COVID -19-patients.pdf Fact Sheet for Healthcare Providers: http://www.biofiredefense.com/wp-content/uploads/2020/03/BIOFIRE-COVID -19-hcp.pdf This test is not yet approved or cleared by the [place] and  has been authorized for detection and/or diagnosis of SARS-CoV-2 by FDA under an Emergency Use Authorization (EUA).  This EUA will remain in effec t (meaning this test can be used) for the duration of  the COVID-19 declaration under Section 564(b)(1) of the Act, 21 U.S.C. section 360bbb-3(b)(1), unless the authorization is terminated or revoked sooner. Performed at [facility], [place] [address], [place], [place] [address]     Radiology Reports Ct Abdomen Pelvis Wo Contrast  Result Date: 08/15/2018 CLINICAL DATA:  Painless jaundice, fatigue and abdominal swelling. EXAM: CT ABDOMEN AND PELVIS WITHOUT CONTRAST TECHNIQUE: Multidetector CT imaging of the abdomen and pelvis was performed following the standard protocol without IV contrast. COMPARISON:  06/18/2013 FINDINGS: Lower chest: Small left pleural effusion noted with overlying atelectasis. The right lung base is clear. The heart is normal in size. No pericardial effusion. Marked eventration of the right hemidiaphragm with overlying atelectasis. Hepatobiliary: Hepatomegaly. The liver contour is irregular and the caudate lobe is prominent. The hepatic fissures are also slightly prominent. Findings consistent with cirrhosis. There is also diffuse heterogeneous fatty infiltration and possible superimposed hepatitis. No obvious hepatic lesion. The gallbladder is distended and demonstrates high attenuation which could be sludge. There also several small  calcified gallstones. The gallbladder wall is thickened but this could be due to ascites and low albumin. No intra or extrahepatic biliary dilatation to suggest biliary obstruction. Pancreas: No mass, inflammation or ductal dilatation. Spleen: Splenomegaly. Spleen measures 16 x 16 x 12.5 cm. No worrisome lesions. Adrenals/Urinary Tract: Adrenal glands and kidneys are unremarkable. No renal, ureteral or bladder calculi or obvious mass without contrast. Stomach/Bowel: The stomach, duodenum, small bowel and colon are grossly normal without oral contrast. No acute inflammatory changes, mass lesions or obstructive findings. Vascular/Lymphatic: Scattered atherosclerotic calcifications involving the aorta but no aneurysm. There are numerous borderline upper abdominal lymph nodes typical with cirrhosis. Scattered retroperitoneal lymph nodes are also noted but no mass or overt adenopathy. Reproductive: The prostate gland and seminal vesicles are unremarkable. Other: Small volume abdominal/pelvic ascites. There is also diffuse mesenteric edema and diffuse body  wall edema. Musculoskeletal: No significant bony findings. IMPRESSION: 1. CT findings consistent with cirrhosis and fatty infiltration of the liver. No focal hepatic lesions or evidence of biliary obstruction. There could be superimposed hepatitis. 2. Evidence of portal venous hypertension with portal venous collaterals, splenomegaly and ascites. 3. Distended gallbladder with high attenuation material which could suggest sludge. Several gallstones are also noted. Gallbladder wall thickening likely due to ascites and low albumin. 4. Small left pleural effusion with overlying atelectasis. 5. Diffuse body wall edema. Electronically Signed   By: Rudie Meyer M.D.   On: 08/15/2018 11:53   US Renal  Result Date: 08/16/2018 CLINICAL DATA:  Initial evaluation for ascites. EXAM: RENAL / URINARY TRACT ULTRASOUND COMPLETE COMPARISON:  None available FINDINGS: Right Kidney:  Renal measurements: 12.5 x 5.5 x 6.1 cm = volume: 216.9 mL . Echogenicity within normal limits. No mass or hydronephrosis visualized. Left Kidney: Renal measurements: 13.6 x 6.2 x 7.1 cm = volume: 309.8 mL. Echogenicity within normal limits. No mass or hydronephrosis visualized. Bladder: Appears normal for degree of bladder distention. IMPRESSION: Normal renal ultrasound. No evidence for hydronephrosis or other acute finding. Electronically Signed   By: Rise Mu M.D.   On: 08/16/2018 20:11   US Abdomen Limited  Result Date: 08/16/2018 CLINICAL DATA:  43 year old male with abdominal distension. Evaluate for ascites. EXAM: LIMITED ABDOMEN ULTRASOUND FOR ASCITES TECHNIQUE: Limited ultrasound survey for ascites was performed in all four abdominal quadrants. COMPARISON:  None. FINDINGS: A small amount of ascites is noted adjacent to the liver. A tiny amount ascites is noted pelvis. IMPRESSION: Small amount of ascites, primarily along the liver. Electronically Signed   By: Harmon Pier M.D.   On: 08/16/2018 12:18   Dg Chest Portable 1 View  Result Date: 08/15/2018 CLINICAL DATA:  Anemia.  Vomiting. EXAM: PORTABLE CHEST 1 VIEW COMPARISON:  January 01, 2018 FINDINGS: Minimal opacity in the lateral left lung base. There is an elevated right hemidiaphragm. There is also opacity in the right lung base. The cardiomediastinal silhouette is normal. IMPRESSION: 1. The opacity in the right lung base may represent a pleural effusion and atelectasis. Infiltrate not excluded. 2. Mild opacity in the lateral left lung base favored represent atelectasis. Subtle infiltrate not excluded. Electronically Signed   By: Gerome Sam III M.D   On: 08/15/2018 10:47    Lab Data:  CBC: Recent Labs  Lab 08/15/18 0915 08/15/18 2033 08/16/18 0324 08/17/18 0357 08/18/18 0457  WBC 8.2 6.4 5.9 5.3 5.4  NEUTROABS 6.1 4.9  --   --   --   HGB 10.8* 10.5* 10.1* 8.1* 9.4*  HCT 30.4* 28.7* 27.7* 22.1* 25.6*  MCV 88.6 87.8  88.2 87.0 88.6  PLT 170 133* 140* 132* 123*   Basic Metabolic Panel: Recent Labs  Lab 08/15/18 0915 08/15/18 0930 08/16/18 0324 08/17/18 0357 08/18/18 0457  NA 120*  --  123* 125* 125*  K 5.2*  --  5.1 4.2 4.1  CL 85*  --  90* 83* 89*  CO2 18*  --  20* 27 23  GLUCOSE 83  --  89 78 155*  BUN 32*  --  37* 41* 48*  CREATININE 2.48*  --  2.78* 2.51* 2.25*  CALCIUM 8.4*  --  7.9* 7.5* 8.4*  MG  --  1.6*  --   --   --    GFR: Estimated Creatinine Clearance: 57.8 mL/min (A) (by C-G formula based on SCr of 2.25 mg/dL (H)). Liver Function Tests: Recent Labs  Lab 08/15/18 0915 08/16/18 0324 08/17/18 0357 08/18/18 0457  AST 226* 215* 189* 161*  ALT 63* 60* 64* 73*  ALKPHOS 189* 168* 158* 161*  BILITOT 28.5* 30.3* 34.4* 39.2*  PROT 6.4* 6.0* 6.0* 6.5  ALBUMIN 2.4* 2.2* 2.5* 2.6*   Recent Labs  Lab 08/15/18 0915  LIPASE 102*   Recent Labs  Lab 08/15/18 0915 08/18/18 0457  AMMONIA 49* 71*   Coagulation Profile: Recent Labs  Lab 08/15/18 0915 08/16/18 0324 08/17/18 0357 08/18/18 0457  INR 2.2* 2.5* 2.4* 2.0*   Cardiac Enzymes: Recent Labs  Lab 08/15/18 0915  TROPONINI <0.03   BNP (last 3 results) No results for input(s): PROBNP in the last 8760 hours. HbA1C: No results for input(s): HGBA1C in the last 72 hours. CBG: No results for input(s): GLUCAP in the last 168 hours. Lipid Profile: No results for input(s): CHOL, HDL, LDLCALC, TRIG, CHOLHDL, LDLDIRECT in the last 72 hours. Thyroid Function Tests: No results for input(s): TSH, T4TOTAL, FREET4, T3FREE, THYROIDAB in the last 72 hours. Anemia Panel: No results for input(s): VITAMINB12, FOLATE, FERRITIN, TIBC, IRON, RETICCTPCT in the last 72 hours. Urine analysis:    Component Value Date/Time   COLORURINE AMBER (A) 08/15/2018 2212   APPEARANCEUR CLOUDY (A) 08/15/2018 2212   LABSPEC 1.017 08/15/2018 2212   PHURINE 5.0 08/15/2018 2212   GLUCOSEU 50 (A) 08/15/2018 2212   HGBUR LARGE (A) 08/15/2018 2212    BILIRUBINUR MODERATE (A) 08/15/2018 2212   KETONESUR 5 (A) 08/15/2018 2212   PROTEINUR 100 (A) 08/15/2018 2212   UROBILINOGEN 0.2 11/29/2009 0506   NITRITE NEGATIVE 08/15/2018 2212   LEUKOCYTESUR NEGATIVE 08/15/2018 2212     Ripudeep Rai M.D. [facility] 08/18/2018, 2:17 PM  Pager: [phone] Between 7am to 7pm - call Pager - [phone]  After 7pm go to [email or web address] - password [email or web address]  Call night coverage person covering after 7pm

## 2018-08-18 NOTE — Progress Notes (Addendum)
Progress Note   Subjective  Complains of severe diarrhea likely from lactulose    Objective  Vital signs in last 24 hours: Temp:  [97.8 F (36.6 C)-98 F (36.7 C)] 98 F (36.7 C) (06/20 0440) Last BM Date: 08/17/18  General: Deeply jaundiced, acutely ill appearing Heart:  Regular rate and rhythm; no murmurs Chest: Clear to ascultation bilaterally Abdomen:  Soft, large, RUQ/epigastric tenderness and nondistended. Normal bowel sounds, without guarding, and without rebound.   Extremities:  2-3 + pitting edema in BLE. Neurologic:  Alert and oriented x 3; slow mentation. Psych:  Alert and cooperative. Appropriate   Intake/Output from previous day: 06/19 0701 - 06/20 0700 In: 1428.5 [P.O.:120; I.V.:1172.3; IV Piggyback:136.2] Out: 450 [Urine:450] Intake/Output this shift: No intake/output data recorded.  Lab Results: Recent Labs    08/16/18 0324 08/17/18 0357 08/18/18 0457  WBC 5.9 5.3 5.4  HGB 10.1* 8.1* 9.4*  HCT 27.7* 22.1* 25.6*  PLT 140* 132* 123*   BMET Recent Labs    08/16/18 0324 08/17/18 0357 08/18/18 0457  NA 123* 125* 125*  K 5.1 4.2 4.1  CL 90* 83* 89*  CO2 20* 27 23  GLUCOSE 89 78 155*  BUN 37* 41* 48*  CREATININE 2.78* 2.51* 2.25*  CALCIUM 7.9* 7.5* 8.4*   LFT Recent Labs    08/16/18 0324  08/18/18 0457 08/18/18 0724  PROT 6.0*   < > 6.5  --   ALBUMIN 2.2*   < > 2.6*  --   AST 215*   < > 161*  --   ALT 60*   < > 73*  --   ALKPHOS 168*   < > 161*  --   BILITOT 30.3*   < > 39.2*  --   BILIDIR 20.2*  --   --  25.7*  IBILI NOT CALCULATED  --   --   --    < > = values in this interval not displayed.   PT/INR Recent Labs    08/17/18 0357 08/18/18 0457  LABPROT 25.6* 22.6*  INR 2.4* 2.0*   Hepatitis Panel Recent Labs    08/15/18 2033  HEPBSAG Negative  HCVAB 0.1  HEPAIGM Negative  HEPBIGM Negative    Studies/Results: US Renal  Result Date: 08/16/2018 CLINICAL DATA:  Initial evaluation for ascites. EXAM: RENAL / URINARY  TRACT ULTRASOUND COMPLETE COMPARISON:  None available FINDINGS: Right Kidney: Renal measurements: 12.5 x 5.5 x 6.1 cm = volume: 216.9 mL . Echogenicity within normal limits. No mass or hydronephrosis visualized. Left Kidney: Renal measurements: 13.6 x 6.2 x 7.1 cm = volume: 309.8 mL. Echogenicity within normal limits. No mass or hydronephrosis visualized. Bladder: Appears normal for degree of bladder distention. IMPRESSION: Normal renal ultrasound. No evidence for hydronephrosis or other acute finding. Electronically Signed   By: Jeannine Boga M.D.   On: 08/16/2018 20:11   US Abdomen Limited  Result Date: 08/16/2018 CLINICAL DATA:  43 year old male with abdominal distension. Evaluate for ascites. EXAM: LIMITED ABDOMEN ULTRASOUND FOR ASCITES TECHNIQUE: Limited ultrasound survey for ascites was performed in all four abdominal quadrants. COMPARISON:  None. FINDINGS: A small amount of ascites is noted adjacent to the liver. A tiny amount ascites is noted pelvis. IMPRESSION: Small amount of ascites, primarily along the liver. Electronically Signed   By: Margarette Canada M.D.   On: 08/16/2018 12:18      Assessment & Recommendations   1. Severe alcoholic hepatitis with underlying alcoholic cirrhosis, small volume ascites, bleeding esophageal varices and HE.  Day 3 Prednisolone. T bili increased to 39 and ammonia increased to 71. INR improved to 2.0 and Cr is improved. Adjust lactulose to reduce diarrhea. He is at high risk for further decompensation and death. He has not shared the details of his illness with family including his brother, Consuello BossierZack Jones. Encouraged to honestly and openly discuss his condition with his family.   2. Bleeding esophageal varices, banded on 6/18. Severe portal gastropathy. Octreotide infusion for 72 hours and then discontinue for bleeding related to portal hypertension however renal may want to continue. Continue PPI bid IV. Continue Rocephin IV. Pending course repeat EGD with possible  additional banding in several weeks with Dr. Leone PayorGessner.   3. ABL anemia. Hb stable.   4. AKI, suspected HRS. Cr has slightly improved. IV albumin, midodrine, IV octreotide per renal service.   5. Candida esophagitis. Complete course of magic mouthwash.   6. History of Barrett's esophagus. Maintain PPI at least qd long term.     LOS: 3 days   Judie PetitMalcolm T. Russella DarStark MD 08/18/2018, 10:22 AM

## 2018-08-19 LAB — COMPREHENSIVE METABOLIC PANEL
ALT: 75 U/L — ABNORMAL HIGH (ref 0–44)
AST: 139 U/L — ABNORMAL HIGH (ref 15–41)
Albumin: 2.7 g/dL — ABNORMAL LOW (ref 3.5–5.0)
Alkaline Phosphatase: 143 U/L — ABNORMAL HIGH (ref 38–126)
Anion gap: 14 (ref 5–15)
BUN: 49 mg/dL — ABNORMAL HIGH (ref 6–20)
CO2: 21 mmol/L — ABNORMAL LOW (ref 22–32)
Calcium: 8.7 mg/dL — ABNORMAL LOW (ref 8.9–10.3)
Chloride: 92 mmol/L — ABNORMAL LOW (ref 98–111)
Creatinine, Ser: 1.73 mg/dL — ABNORMAL HIGH (ref 0.61–1.24)
GFR calc Af Amer: 56 mL/min — ABNORMAL LOW (ref >60)
GFR calc non Af Amer: 48 mL/min — ABNORMAL LOW (ref >60)
Glucose, Bld: 130 mg/dL — ABNORMAL HIGH (ref 70–99)
Potassium: 3.4 mmol/L — ABNORMAL LOW (ref 3.5–5.1)
Sodium: 127 mmol/L — ABNORMAL LOW (ref 135–145)
Total Bilirubin: 39.7 mg/dL (ref 0.3–1.2)
Total Protein: 6.4 g/dL — ABNORMAL LOW (ref 6.5–8.1)

## 2018-08-19 LAB — CBC
HCT: 25.2 % — ABNORMAL LOW (ref 39.0–52.0)
Hemoglobin: 9.3 g/dL — ABNORMAL LOW (ref 13.0–17.0)
MCH: 32.5 pg (ref 26.0–34.0)
MCHC: 36.9 g/dL — ABNORMAL HIGH (ref 30.0–36.0)
MCV: 88.1 fL (ref 80.0–100.0)
Platelets: 119 10*3/uL — ABNORMAL LOW (ref 150–400)
RBC: 2.86 MIL/uL — ABNORMAL LOW (ref 4.22–5.81)
RDW: 18.6 % — ABNORMAL HIGH (ref 11.5–15.5)
WBC: 6 10*3/uL (ref 4.0–10.5)
nRBC: 0 % (ref 0.0–0.2)

## 2018-08-19 LAB — PROTIME-INR
INR: 2.1 — ABNORMAL HIGH (ref 0.8–1.2)
Prothrombin Time: 23.2 seconds — ABNORMAL HIGH (ref 11.4–15.2)

## 2018-08-19 MED ORDER — FUROSEMIDE 10 MG/ML IJ SOLN
20.0000 mg | Freq: Two times a day (BID) | INTRAMUSCULAR | Status: AC
  Administered 2018-08-19 – 2018-08-20 (×2): 20 mg via INTRAVENOUS
  Filled 2018-08-19 (×2): qty 2

## 2018-08-19 MED ORDER — POTASSIUM CHLORIDE CRYS ER 20 MEQ PO TBCR
40.0000 meq | EXTENDED_RELEASE_TABLET | Freq: Once | ORAL | Status: AC
  Administered 2018-08-19: 40 meq via ORAL
  Filled 2018-08-19: qty 2

## 2018-08-19 MED ORDER — FUROSEMIDE 10 MG/ML IJ SOLN
40.0000 mg | Freq: Once | INTRAMUSCULAR | Status: AC
  Administered 2018-08-19: 40 mg via INTRAVENOUS
  Filled 2018-08-19: qty 4

## 2018-08-19 NOTE — Progress Notes (Signed)
[facility]    NEPHROLOGY PROGRESS NOTE  SUBJECTIVE: Seen and examined.  Complains of fatigue and edema.  Denies any headaches, fevers, chills, chest pain, shortness of breath, nausea, vomiting, diarrhea or dysuria.  All other review of systems are negative.    OBJECTIVE:  Vitals:   08/18/18 2340 08/19/18 0446  BP: 106/72 115/66  Pulse: 66 69  Resp: 18 15  Temp: 98 F (36.7 C) 97.8 F (36.6 C)  SpO2: 94% 96%    Intake/Output Summary (Last 24 hours) at 08/19/2018 1458 Last data filed at 08/18/2018 1622 Gross per 24 hour  Intake 200 ml  Output -  Net 200 ml      General: Alert to self, no acute distress HEENT: MMM [place] AT anicteric sclera  neck:  No JVD, no adenopathy CV:  Heart RRR  Lungs:  L/S CTA bilaterally Abd: Distended, nontender with normal BS GU:  Bladder non-palpable Extremities: +3 bilateral lower extremity edema  skin:  No skin rash, positive jaundice  MEDICATIONS:  . folic acid  1 mg Oral Daily  . lactulose  20 g Oral TID  . magic mouthwash  10 mL Oral QID  . midodrine  10 mg Oral TID WC  . multivitamin with minerals  1 tablet Oral Daily  . pantoprazole (PROTONIX) IV  40 mg Intravenous Q12H  . prednisoLONE  40 mg Oral Daily  . thiamine  100 mg Oral Daily   Or  . thiamine  100 mg Intravenous Daily       LABS:   CBC Latest Ref Rng & Units 08/19/2018 08/18/2018 08/17/2018  WBC 4.0 - 10.5 K/uL 6.0 5.4 5.3  Hemoglobin 13.0 - 17.0 g/dL 9.3(L) 9.4(L) 8.1(L)  Hematocrit 39.0 - 52.0 % 25.2(L) 25.6(L) 22.1(L)  Platelets 150 - 400 K/uL 119(L) 123(L) 132(L)    CMP Latest Ref Rng & Units 08/19/2018 08/18/2018 08/17/2018  Glucose 70 - 99 mg/dL 130(H) 155(H) 78  BUN 6 - 20 mg/dL 49(H) 48(H) 41(H)  Creatinine 0.61 - 1.24 mg/dL 1.73(H) 2.25(H) 2.51(H)  Sodium 135 - 145 mmol/L 127(L) 125(L) 125(L)  Potassium 3.5 - 5.1 mmol/L 3.4(L) 4.1 4.2  Chloride 98 - 111 mmol/L 92(L) 89(L) 83(L)  CO2 22 - 32 mmol/L 21(L) 23 27  Calcium 8.9 - 10.3 mg/dL 8.7(L)  8.4(L) 7.5(L)  Total Protein 6.5 - 8.1 g/dL 6.4(L) 6.5 6.0(L)  Total Bilirubin 0.3 - 1.2 mg/dL 39.7(HH) 39.2(HH) 34.4(HH)  Alkaline Phos 38 - 126 U/L 143(H) 161(H) 158(H)  AST 15 - 41 U/L 139(H) 161(H) 189(H)  ALT 0 - 44 U/L 75(H) 73(H) 64(H)    Lab Results  Component Value Date   CALCIUM 8.7 (L) 08/19/2018   CAION 1.11 (L) 01/01/2018       Component Value Date/Time   COLORURINE AMBER (A) 08/15/2018 2212   APPEARANCEUR CLOUDY (A) 08/15/2018 2212   LABSPEC 1.017 08/15/2018 2212   PHURINE 5.0 08/15/2018 2212   GLUCOSEU 50 (A) 08/15/2018 2212   HGBUR LARGE (A) 08/15/2018 2212   BILIRUBINUR MODERATE (A) 08/15/2018 2212   KETONESUR 5 (A) 08/15/2018 2212   PROTEINUR 100 (A) 08/15/2018 2212   UROBILINOGEN 0.2 11/29/2009 0506   NITRITE NEGATIVE 08/15/2018 2212   LEUKOCYTESUR NEGATIVE 08/15/2018 2212      Component Value Date/Time   HCO3 29.8 (H) 03/25/2007 1915   TCO2 27 01/01/2018 1422       Component Value Date/Time   IRON 119 07/05/2011 1144   FERRITIN 39.7 07/05/2011 1144   IRONPCTSAT 22.6 07/05/2011  1144       ASSESSMENT/PLAN:    43 year old male patient with a past medical history significant for hypertension, depression, Barrett's esophagus, and heavy alcohol use who presented with a two-week history of nausea, vomiting, hematemesis, and right upper quadrant pain.  He was noted to have markedly elevated LFTs and a total bili of 28.5.  1.  Baseline serum creatinine 0.8 from 02/26/2018.  2.  Alcoholic hepatitis.  Continue prednisolone.  3.  Acute kidney injury.  Likely secondary to hepatorenal syndrome with markedly low urine sodium.   Patient appears markedly fluid overloaded.  Will maintain off of IV fluids.  Creatinine is improving.  Continue octreotide and Midrin.  Will discontinue albumin for market fluid overload.  Will administer furosemide to try to diurese.  4.  Acute liver failure.  Overall poor prognosis.  Bilirubin continues to increase.    [address] DriveNancy  GarnerFinnigan, DO, [place]FACP

## 2018-08-19 NOTE — Progress Notes (Addendum)
    Progress Note   Subjective  No changes. Diarrhea improved   Objective  Vital signs in last 24 hours: Temp:  [97.8 F (36.6 C)-98.9 F (37.2 C)] 97.8 F (36.6 C) (06/21 0446) Pulse Rate:  [66-78] 69 (06/21 0446) Resp:  [15-18] 15 (06/21 0446) BP: (106-121)/(66-72) 115/66 (06/21 0446) SpO2:  [94 %-98 %] 96 % (06/21 0446) Last BM Date: 08/17/18  General: Deeply jaundice, acutely ill appearing Heart:  Regular rate and rhythm; no murmurs Chest: Clear to ascultation bilaterally Abdomen:  Soft, large, RUQ tender and nondistended. Normal bowel sounds, without guarding, and without rebound.   Extremities:  2-3 + pitting edema in BLE Neurologic:  Alert and  oriented x 3; slow mentation  Psych:  Alert and cooperative. Appropriate  Intake/Output from previous day: 06/20 0701 - 06/21 0700 In: 200 [IV Piggyback:200] Out: -  Intake/Output this shift: No intake/output data recorded.  Lab Results: Recent Labs    08/17/18 0357 08/18/18 0457 08/19/18 0344  WBC 5.3 5.4 6.0  HGB 8.1* 9.4* 9.3*  HCT 22.1* 25.6* 25.2*  PLT 132* 123* 119*   BMET Recent Labs    08/17/18 0357 08/18/18 0457 08/19/18 0344  NA 125* 125* 127*  K 4.2 4.1 3.4*  CL 83* 89* 92*  CO2 27 23 21*  GLUCOSE 78 155* 130*  BUN 41* 48* 49*  CREATININE 2.51* 2.25* 1.73*  CALCIUM 7.5* 8.4* 8.7*   LFT Recent Labs    08/18/18 0724 08/19/18 0344  PROT  --  6.4*  ALBUMIN  --  2.7*  AST  --  139*  ALT  --  75*  ALKPHOS  --  143*  BILITOT  --  39.7*  BILIDIR 25.7*  --    PT/INR Recent Labs    08/18/18 0457 08/19/18 0344  LABPROT 22.6* 23.2*  INR 2.0* 2.1*   Hepatitis Panel No results for input(s): HEPBSAG, HCVAB, HEPAIGM, HEPBIGM in the last 72 hours.  Studies/Results: No results found.    Assessment & Recommendations   1. Severe alcoholic hepatitis with underlying alcoholic cirrhosis, small volume ascites, bleeding esophageal varices and HE. Day 3 Prednisolone. T bili is stable at 39. INR  is stable at 2.1. Cr has improved to 1.73. He is at high risk for further decompensation and death.   2. Bleeding esophageal varices, banded on 6/18. Severe portal gastropathy. Octreotide infusion to finish today for bleeding related to portal hypertension however renal may want to continue. Continue PPI bid IV. Continue Rocephin IV. Pending course repeat EGD with possible additional banding in several weeks with Dr. Carlean Purl.   3. ABL anemia. Hb stable.   4. AKI, suspected HRS. Cr has improved. Per renal service.   5. Candida esophagitis. Complete course of magic mouthwash.   6. History of Barrett's esophagus. Maintain PPI at least qd long term.  Dr. Lyndel Safe is covering [facility] service starting on Monday.    LOS: 4 days   Joseph Valenzuela Plan MD 08/19/2018, 9:47 AM

## 2018-08-19 NOTE — Progress Notes (Signed)
[facility]                                                                              Patient Demographics  Joseph Valenzuela, is a 43 y.o. male, DOB - 03/19/1975, ZOX:[id number]  Admit date - 08/15/2018   Admitting Physician Carron Curie, MD  Outpatient Primary MD for the patient is Kristian Covey, MD  Outpatient specialists:   LOS - 4  days   Medical records reviewed and are as summarized below:    Chief Complaint  Patient presents with   Diarrhea   Jaundice       Brief summary   Patient is a 43 year old male with hypertension, depression, Barrett's esophagus, alcoholism presented with 2-week history of nausea, vomiting, hematemesis, right upper quadrant abdominal pain.  Patient has been drinking a lot, fifth almost every day.   In ED, patient was noted to be jaundiced, sodium 120, potassium 5.2, creatinine 2.48, hemoglobin 10.8.  Elevated LFTs with total bili of 28.5. GI was consulted. COVID-19 test negative  Assessment & Plan    Principal problem Acute upper GI bleed, acute blood loss anemia -Patient has a history of GERD, Barrett's esophagus, possible EtOH gastritis or portal hypertensive gastritis.  FOBT positive. 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  Continue IV PPI 40 mg every 12 hours, octreotide drip.   -H&H stable  Active Problems: Acute alcoholic hepatitis, transaminitis with hyperbilirubinemia with underlying cirrhosis, jaundice -Patient has been drinking heavily, states fifth of hard liquor almost every day -CT abdomen and pelvis showed cirrhosis, fatty infiltration of the liver, no focal hepatic lesions or evidence of biliary obstruction, could be superimposed hepatitis.  Evidence of portal venous hypertension with portal venous collateral splenomegaly and ascites.   Distended gallbladder, suggests sludge, several gallstones. -We will Rocephin for 5 days for SBP prophylaxis -Continue prednisolone, lactulose   Coagulopathy: Secondary to liver cirrhosis -Received vitamin K, FFP, INR 2.1  Acute kidney injury:   Hepatorenal syndrome, marked volume overload  -Creatinine 2.4 at the time of admission, in 01/2018, creatinine was 0.8.   - Cr 1.7, improving -Continue to hold lisinopril, HCTZ -Continue octreotide, midodrine - DC albumin, start Lasix 40 mg IV x1, then 20 mg every 12 hours  Hyponatremia with lactic acidosis Sodium 120 at the time of admission with hyperkalemia, patient was placed on IV fluids Sodium stable, follow sodium and creatinine with diuresis.  History of alcohol abuse -Heavy drinker, placed on CIWA scale with Ativan, high risk of withdrawals -Continue thiamine, folate, MVI  Hypotension -BP currently stable, continue octreotide, midodrine  Code Status: Full CODE STATUS DVT Prophylaxis:   SCD's Family Communication: Discussed in detail with the patient, all imaging results, lab results explained to the patient    Disposition Plan: Continue stepdown status with high mortality, acute  alcoholic hepatitis, upper GI bleed, hypotensive, hepatorenal syndrome  Time Spent in minutes 25 minutes  Procedures:  EGD  Consultants:   Gastroenterology Nephrology CCM  Antimicrobials:   Anti-infectives (From admission, onward)  Start     Dose/Rate Route Frequency Ordered Stop   08/17/18 1200  cefTRIAXone (ROCEPHIN) 2 g in sodium chloride 0.9 % 100 mL IVPB     2 g 200 mL/hr over 30 Minutes Intravenous Every 24 hours 08/17/18 1145 08/22/18 1144         Medications  Scheduled Meds:  folic acid  1 mg Oral Daily   furosemide  20 mg Intravenous Q12H   lactulose  20 g Oral TID   magic mouthwash  10 mL Oral QID   midodrine  10 mg Oral TID WC   multivitamin with minerals  1 tablet Oral Daily   pantoprazole (PROTONIX) IV  40  mg Intravenous Q12H   prednisoLONE  40 mg Oral Daily   thiamine  100 mg Oral Daily   Or   thiamine  100 mg Intravenous Daily   Continuous Infusions:  albumin human 12.5 g (08/19/18 0909)   cefTRIAXone (ROCEPHIN)  IV 2 g (08/19/18 1113)   octreotide  (SANDOSTATIN)    IV infusion Stopped (08/18/18 1434)   PRN Meds:.fentaNYL (SUBLIMAZE) injection, ipratropium-albuterol, ondansetron **OR** ondansetron (ZOFRAN) IV      Subjective:   Gilford RileBobby Hanke was seen and examined today.  Feels miserable due to volume overload, marked edema to thighs.  No fevers or chills.  No vomiting or any significant abdominal pain.  Poor appetite.  Objective:   Vitals:   08/18/18 1334 08/18/18 1956 08/18/18 2340 08/19/18 0446  BP:  121/72 106/72 115/66  Pulse: 70 78 66 69  Resp:  16 18 15   Temp: 98.9 F (37.2 C) 98.4 F (36.9 C) 98 F (36.7 C) 97.8 F (36.6 C)  TempSrc: Oral Oral Oral Oral  SpO2: 98% 94% 94% 96%  Weight:      Height:        Intake/Output Summary (Last 24 hours) at 08/19/2018 1509 Last data filed at 08/18/2018 1622 Gross per 24 hour  Intake 200 ml  Output --  Net 200 ml     Wt Readings from Last 3 Encounters:  08/15/18 130.5 kg  05/18/18 130.5 kg  02/26/18 127.9 kg   Physical Exam  General: Alert and oriented x 3, NAD, jaundice  Eyes: PERRLA, EOMI, icteric Sclera,  HEENT:  Atraumatic, normocephalic  Cardiovascular: S1 S2 clear, no murmurs, RRR.  Respiratory: Decreased breath sound at the bases  Gastrointestinal: Soft, nontender, distended, NBS  Ext: Marked volume overload, 3+ pitting edema up to thighs  Neuro: no new deficits  Musculoskeletal: No cyanosis, clubbing  Skin: No rashes  Psych: Normal affect and demeanor, alert and oriented x3     Data Reviewed:  I have personally reviewed following labs and imaging studies  Micro Results Recent Results (from the past 240 hour(s))  Novel Coronavirus,NAA,(SEND-OUT TO REF LAB - TAT 24-48 hrs); Hosp  Order     Status: None   Collection Time: 08/15/18 10:31 AM   Specimen: Nasopharyngeal Swab; Respiratory  Result Value Ref Range Status   SARS-CoV-2, NAA NOT DETECTED NOT DETECTED Final    Comment: (NOTE) This test was developed and its performance characteristics determined by [facility]. This test has not been FDA cleared or approved. This test has been authorized by FDA under an Emergency Use Authorization (EUA). This test is only authorized for the duration of time the declaration that circumstances exist justifying the authorization of the emergency use of in vitro diagnostic tests for detection of SARS-CoV-2 virus and/or diagnosis of COVID-19 infection under section 564(b)(1) of the  Act, 21 U.S.C. 161WRU-0(A)(5360bbb-3(b)(1), unless the authorization is terminated or revoked sooner. When diagnostic testing is negative, the possibility of a false negative result should be considered in the context of a patient's recent exposures and the presence of clinical signs and symptoms consistent with COVID-19. An individual without symptoms of COVID-19 and who is not shedding SARS-CoV-2 virus would expect to have a negative (not detected) result in this assay. Performed  At: [facility] [address] [place], [place] [id number] Jolene SchimkeNagendra Sanjai MD NW:[id number]:[phone]    Coronavirus Source NASOPHARYNGEAL  Final    Comment: Performed at [facility], [address] [place], [place] [address]  SARS Coronavirus 2     Status: None   Collection Time: 08/15/18  2:19 PM  Result Value Ref Range Status   SARS Coronavirus 2 NOT DETECTED NOT DETECTED Final    Comment: (NOTE) SARS-CoV-2 target nucleic acids are NOT DETECTED. The SARS-CoV-2 RNA is generally detectable in upper and lower respiratory specimens during the acute phase of infection.  Negative  results do not preclude SARS-CoV-2 infection, do not rule out co-infections with other pathogens, and should not be used as the sole  basis for treatment or other patient management decisions.  Negative results must be combined with clinical observations, patient history, and epidemiological information. The expected result is Not Detected. Fact Sheet for Patients: http://www.biofiredefense.com/wp-content/uploads/2020/03/BIOFIRE-COVID -19-patients.pdf Fact Sheet for Healthcare Providers: http://www.biofiredefense.com/wp-content/uploads/2020/03/BIOFIRE-COVID -19-hcp.pdf This test is not yet approved or cleared by the [place] FDA and  has been authorized for detection and/or diagnosis of SARS-CoV-2 by FDA under an Emergency Use Authorization (EUA).  This EUA will remain in effec t (meaning this test can be used) for the duration of  the COVID-19 declaration under Section 564(b)(1) of the Act, 21 U.S.C. section 360bbb-3(b)(1), unless the authorization is terminated or revoked sooner. Performed at [facility], [address] [place], [place] [address]     Radiology Reports Ct Abdomen Pelvis Wo Contrast  Result Date: 08/15/2018 CLINICAL DATA:  Painless jaundice, fatigue and abdominal swelling. EXAM: CT ABDOMEN AND PELVIS WITHOUT CONTRAST TECHNIQUE: Multidetector CT imaging of the abdomen and pelvis was performed following the standard protocol without IV contrast. COMPARISON:  06/18/2013 FINDINGS: Lower chest: Small left pleural effusion noted with overlying atelectasis. The right lung base is clear. The heart is normal in size. No pericardial effusion. Marked eventration of the right hemidiaphragm with overlying atelectasis. Hepatobiliary: Hepatomegaly. The liver contour is irregular and the caudate lobe is prominent. The hepatic fissures are also slightly prominent. Findings consistent with cirrhosis. There is also diffuse heterogeneous fatty infiltration and possible superimposed hepatitis. No obvious hepatic lesion. The gallbladder is distended and demonstrates high attenuation which could be sludge. There  also several small calcified gallstones. The gallbladder wall is thickened but this could be due to ascites and low albumin. No intra or extrahepatic biliary dilatation to suggest biliary obstruction. Pancreas: No mass, inflammation or ductal dilatation. Spleen: Splenomegaly. Spleen measures 16 x 16 x 12.5 cm. No worrisome lesions. Adrenals/Urinary Tract: Adrenal glands and kidneys are unremarkable. No renal, ureteral or bladder calculi or obvious mass without contrast. Stomach/Bowel: The stomach, duodenum, small bowel and colon are grossly normal without oral contrast. No acute inflammatory changes, mass lesions or obstructive findings. Vascular/Lymphatic: Scattered atherosclerotic calcifications involving the aorta but no aneurysm. There are numerous borderline upper abdominal lymph nodes typical with cirrhosis. Scattered retroperitoneal lymph nodes are also noted but no mass or overt adenopathy. Reproductive: The prostate gland and seminal  vesicles are unremarkable. Other: Small volume abdominal/pelvic ascites. There is also diffuse mesenteric edema and diffuse body wall edema. Musculoskeletal: No significant bony findings. IMPRESSION: 1. CT findings consistent with cirrhosis and fatty infiltration of the liver. No focal hepatic lesions or evidence of biliary obstruction. There could be superimposed hepatitis. 2. Evidence of portal venous hypertension with portal venous collaterals, splenomegaly and ascites. 3. Distended gallbladder with high attenuation material which could suggest sludge. Several gallstones are also noted. Gallbladder wall thickening likely due to ascites and low albumin. 4. Small left pleural effusion with overlying atelectasis. 5. Diffuse body wall edema. Electronically Signed   By: Marijo Sanes M.D.   On: 08/15/2018 11:53   US Renal  Result Date: 08/16/2018 CLINICAL DATA:  Initial evaluation for ascites. EXAM: RENAL / URINARY TRACT ULTRASOUND COMPLETE COMPARISON:  None available  FINDINGS: Right Kidney: Renal measurements: 12.5 x 5.5 x 6.1 cm = volume: 216.9 mL . Echogenicity within normal limits. No mass or hydronephrosis visualized. Left Kidney: Renal measurements: 13.6 x 6.2 x 7.1 cm = volume: 309.8 mL. Echogenicity within normal limits. No mass or hydronephrosis visualized. Bladder: Appears normal for degree of bladder distention. IMPRESSION: Normal renal ultrasound. No evidence for hydronephrosis or other acute finding. Electronically Signed   By: Jeannine Boga M.D.   On: 08/16/2018 20:11   US Abdomen Limited  Result Date: 08/16/2018 CLINICAL DATA:  43 year old male with abdominal distension. Evaluate for ascites. EXAM: LIMITED ABDOMEN ULTRASOUND FOR ASCITES TECHNIQUE: Limited ultrasound survey for ascites was performed in all four abdominal quadrants. COMPARISON:  None. FINDINGS: A small amount of ascites is noted adjacent to the liver. A tiny amount ascites is noted pelvis. IMPRESSION: Small amount of ascites, primarily along the liver. Electronically Signed   By: Margarette Canada M.D.   On: 08/16/2018 12:18   Dg Chest Portable 1 View  Result Date: 08/15/2018 CLINICAL DATA:  Anemia.  Vomiting. EXAM: PORTABLE CHEST 1 VIEW COMPARISON:  January 01, 2018 FINDINGS: Minimal opacity in the lateral left lung base. There is an elevated right hemidiaphragm. There is also opacity in the right lung base. The cardiomediastinal silhouette is normal. IMPRESSION: 1. The opacity in the right lung base may represent a pleural effusion and atelectasis. Infiltrate not excluded. 2. Mild opacity in the lateral left lung base favored represent atelectasis. Subtle infiltrate not excluded. Electronically Signed   By: Dorise Bullion III M.D   On: 08/15/2018 10:47    Lab Data:  CBC: Recent Labs  Lab 08/15/18 0915 08/15/18 2033 08/16/18 0324 08/17/18 0357 08/18/18 0457 08/19/18 0344  WBC 8.2 6.4 5.9 5.3 5.4 6.0  NEUTROABS 6.1 4.9  --   --   --   --   HGB 10.8* 10.5* 10.1* 8.1* 9.4*  9.3*  HCT 30.4* 28.7* 27.7* 22.1* 25.6* 25.2*  MCV 88.6 87.8 88.2 87.0 88.6 88.1  PLT 170 133* 140* 132* 123* 983*   Basic Metabolic Panel: Recent Labs  Lab 08/15/18 0915 08/15/18 0930 08/16/18 0324 08/17/18 0357 08/18/18 0457 08/19/18 0344  NA 120*  --  123* 125* 125* 127*  K 5.2*  --  5.1 4.2 4.1 3.4*  CL 85*  --  90* 83* 89* 92*  CO2 18*  --  20* 27 23 21*  GLUCOSE 83  --  89 78 155* 130*  BUN 32*  --  37* 41* 48* 49*  CREATININE 2.48*  --  2.78* 2.51* 2.25* 1.73*  CALCIUM 8.4*  --  7.9* 7.5* 8.4* 8.7*  MG  --  1.6*  --   --   --   --    GFR: Estimated Creatinine Clearance: 75.2 mL/min (A) (by C-G formula based on SCr of 1.73 mg/dL (H)). Liver Function Tests: Recent Labs  Lab 08/15/18 0915 08/16/18 0324 08/17/18 0357 08/18/18 0457 08/19/18 0344  AST 226* 215* 189* 161* 139*  ALT 63* 60* 64* 73* 75*  ALKPHOS 189* 168* 158* 161* 143*  BILITOT 28.5* 30.3* 34.4* 39.2* 39.7*  PROT 6.4* 6.0* 6.0* 6.5 6.4*  ALBUMIN 2.4* 2.2* 2.5* 2.6* 2.7*   Recent Labs  Lab 08/15/18 0915  LIPASE 102*   Recent Labs  Lab 08/15/18 0915 08/18/18 0457  AMMONIA 49* 71*   Coagulation Profile: Recent Labs  Lab 08/15/18 0915 08/16/18 0324 08/17/18 0357 08/18/18 0457 08/19/18 0344  INR 2.2* 2.5* 2.4* 2.0* 2.1*   Cardiac Enzymes: Recent Labs  Lab 08/15/18 0915  TROPONINI <0.03   BNP (last 3 results) No results for input(s): PROBNP in the last 8760 hours. HbA1C: No results for input(s): HGBA1C in the last 72 hours. CBG: Recent Labs  Lab 08/18/18 1957  GLUCAP 139*   Lipid Profile: No results for input(s): CHOL, HDL, LDLCALC, TRIG, CHOLHDL, LDLDIRECT in the last 72 hours. Thyroid Function Tests: No results for input(s): TSH, T4TOTAL, FREET4, T3FREE, THYROIDAB in the last 72 hours. Anemia Panel: No results for input(s): VITAMINB12, FOLATE, FERRITIN, TIBC, IRON, RETICCTPCT in the last 72 hours. Urine analysis:    Component Value Date/Time   COLORURINE AMBER (A)  08/15/2018 2212   APPEARANCEUR CLOUDY (A) 08/15/2018 2212   LABSPEC 1.017 08/15/2018 2212   PHURINE 5.0 08/15/2018 2212   GLUCOSEU 50 (A) 08/15/2018 2212   HGBUR LARGE (A) 08/15/2018 2212   BILIRUBINUR MODERATE (A) 08/15/2018 2212   KETONESUR 5 (A) 08/15/2018 2212   PROTEINUR 100 (A) 08/15/2018 2212   UROBILINOGEN 0.2 11/29/2009 0506   NITRITE NEGATIVE 08/15/2018 2212   LEUKOCYTESUR NEGATIVE 08/15/2018 2212     Medea Deines M.D. [facility] 08/19/2018, 3:09 PM  Pager: [phone] Between 7am to 7pm - call Pager - [phone]  After 7pm go to [email or web address] - password [email or web address]  Call night coverage person covering after 7pm

## 2018-08-20 DIAGNOSIS — R17 Unspecified jaundice: Secondary | ICD-10-CM

## 2018-08-20 DIAGNOSIS — F1010 Alcohol abuse, uncomplicated: Secondary | ICD-10-CM

## 2018-08-20 DIAGNOSIS — K7200 Acute and subacute hepatic failure without coma: Secondary | ICD-10-CM

## 2018-08-20 DIAGNOSIS — I8511 Secondary esophageal varices with bleeding: Secondary | ICD-10-CM

## 2018-08-20 DIAGNOSIS — K7031 Alcoholic cirrhosis of liver with ascites: Secondary | ICD-10-CM

## 2018-08-20 LAB — COMPREHENSIVE METABOLIC PANEL
ALT: 84 U/L — ABNORMAL HIGH (ref 0–44)
AST: 139 U/L — ABNORMAL HIGH (ref 15–41)
Albumin: 2.8 g/dL — ABNORMAL LOW (ref 3.5–5.0)
Alkaline Phosphatase: 126 U/L (ref 38–126)
Anion gap: 15 (ref 5–15)
BUN: 51 mg/dL — ABNORMAL HIGH (ref 6–20)
CO2: 20 mmol/L — ABNORMAL LOW (ref 22–32)
Calcium: 9.3 mg/dL (ref 8.9–10.3)
Chloride: 95 mmol/L — ABNORMAL LOW (ref 98–111)
Creatinine, Ser: 1.44 mg/dL — ABNORMAL HIGH (ref 0.61–1.24)
GFR calc Af Amer: >60 mL/min (ref >60)
GFR calc non Af Amer: 60 mL/min — ABNORMAL LOW (ref >60)
Glucose, Bld: 114 mg/dL — ABNORMAL HIGH (ref 70–99)
Potassium: 3.3 mmol/L — ABNORMAL LOW (ref 3.5–5.1)
Sodium: 130 mmol/L — ABNORMAL LOW (ref 135–145)
Total Bilirubin: 40.2 mg/dL (ref 0.3–1.2)
Total Protein: 6.3 g/dL — ABNORMAL LOW (ref 6.5–8.1)

## 2018-08-20 LAB — PROTIME-INR
INR: 2.1 — ABNORMAL HIGH (ref 0.8–1.2)
Prothrombin Time: 23.4 seconds — ABNORMAL HIGH (ref 11.4–15.2)

## 2018-08-20 LAB — CBC
HCT: 25 % — ABNORMAL LOW (ref 39.0–52.0)
Hemoglobin: 9.1 g/dL — ABNORMAL LOW (ref 13.0–17.0)
MCH: 32.6 pg (ref 26.0–34.0)
MCHC: 36.4 g/dL — ABNORMAL HIGH (ref 30.0–36.0)
MCV: 89.6 fL (ref 80.0–100.0)
Platelets: 109 10*3/uL — ABNORMAL LOW (ref 150–400)
RBC: 2.79 MIL/uL — ABNORMAL LOW (ref 4.22–5.81)
RDW: 19.4 % — ABNORMAL HIGH (ref 11.5–15.5)
WBC: 6.8 10*3/uL (ref 4.0–10.5)
nRBC: 0.3 % — ABNORMAL HIGH (ref 0.0–0.2)

## 2018-08-20 MED ORDER — POTASSIUM CHLORIDE CRYS ER 20 MEQ PO TBCR
40.0000 meq | EXTENDED_RELEASE_TABLET | Freq: Once | ORAL | Status: AC
  Administered 2018-08-20: 40 meq via ORAL
  Filled 2018-08-20: qty 2

## 2018-08-20 MED ORDER — LACTULOSE 10 GM/15ML PO SOLN
10.0000 g | Freq: Two times a day (BID) | ORAL | Status: DC
  Administered 2018-08-20 – 2018-08-21 (×2): 10 g via ORAL
  Filled 2018-08-20 (×2): qty 15

## 2018-08-20 MED ORDER — FUROSEMIDE 10 MG/ML IJ SOLN
40.0000 mg | Freq: Once | INTRAMUSCULAR | Status: AC
  Administered 2018-08-20: 40 mg via INTRAVENOUS
  Filled 2018-08-20: qty 4

## 2018-08-20 NOTE — Progress Notes (Signed)
[facility]                                                                              Patient Demographics  Joseph Valenzuela, is a 43 y.o. male, DOB - 19-Oct-1975, UJW:[id number]  Admit date - 08/15/2018   Admitting Physician Carron Curie, MD  Outpatient Primary MD for the patient is Kristian Covey, MD  Outpatient specialists:   LOS - 5  days   Medical records reviewed and are as summarized below:    Chief Complaint  Patient presents with   Diarrhea   Jaundice       Brief summary   Patient is a 43 year old male with hypertension, depression, Barrett's esophagus, alcoholism presented with 2-week history of nausea, vomiting, hematemesis, right upper quadrant abdominal pain.  Patient has been drinking a lot, fifth almost every day.   In ED, patient was noted to be jaundiced, sodium 120, potassium 5.2, creatinine 2.48, hemoglobin 10.8.  Elevated LFTs with total bili of 28.5. GI was consulted. COVID-19 test negative  Assessment & Plan    Principal problem Acute upper GI bleed, acute blood loss anemia -Patient has a history of GERD, Barrett's esophagus, possible EtOH gastritis or portal hypertensive gastritis.  FOBT positive. 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  Continue PPI 40 mg every 12 hours.  H&H stable  Active Problems: Acute alcoholic hepatitis, transaminitis with hyperbilirubinemia with underlying cirrhosis, jaundice -Patient has been drinking heavily, states fifth of hard liquor almost every day -CT abdomen and pelvis showed cirrhosis, fatty infiltration of the liver, no focal hepatic lesions or evidence of biliary obstruction, could be superimposed hepatitis.  Evidence of portal venous hypertension with portal venous collateral  splenomegaly and ascites.  Distended gallbladder, suggests sludge, several gallstones. -Continue IV Rocephin for 5 days for SBP prophylaxis  -Continue prednisolone, lactulose -Total bili continues to rise 40.2, high risk of decompensation and poor prognosis  Coagulopathy: Secondary to liver cirrhosis -Received vitamin K, FFP, INR 2.1  Acute kidney injury:   Hepatorenal syndrome, marked volume overload  -Creatinine 2.4 at the time of admission, in 01/2018, creatinine was 0.8.   -Nine 1.4 improving, continue to hold lisinopril, HCTZ -Continue midodrine, completed octreotide, albumin IV discontinued -Continue Lasix 40 mg IV x1, 7.0 L positive balance  Hypokalemia -Replaced oral  Hyponatremia with lactic acidosis Sodium 120 at the time of admission with hyperkalemia, patient was placed on IV fluids Sodium stable, follow sodium and creatinine with diuresis.  History of alcohol abuse -Heavy drinker, placed on CIWA scale with Ativan, high risk of withdrawals -Continue thiamine, folate, MVI  Hypotension -BP currently stable, continue midodrine  Code Status: Full CODE STATUS DVT Prophylaxis:   SCD's Family Communication: Discussed in detail with the patient, all imaging results, lab results explained to the patient.  Patient does not want his family members to be called   Disposition Plan: Continue stepdown status with high mortality, acute  alcoholic hepatitis, upper GI bleed,  hypotensive, hepatorenal syndrome.  Patient requested case management consult for financial help  Time Spent in minutes 25 minutes  Procedures:  EGD  Consultants:   Gastroenterology Nephrology CCM  Antimicrobials:   Anti-infectives (From admission, onward)   Start     Dose/Rate Route Frequency Ordered Stop   08/17/18 1200  cefTRIAXone (ROCEPHIN) 2 g in sodium chloride 0.9 % 100 mL IVPB     2 g 200 mL/hr over 30 Minutes Intravenous Every 24 hours 08/17/18 1145 08/22/18 1144          Medications  Scheduled Meds:  folic acid  1 mg Oral Daily   lactulose  20 g Oral TID   magic mouthwash  10 mL Oral QID   midodrine  10 mg Oral TID WC   multivitamin with minerals  1 tablet Oral Daily   pantoprazole (PROTONIX) IV  40 mg Intravenous Q12H   prednisoLONE  40 mg Oral Daily   thiamine  100 mg Oral Daily   Or   thiamine  100 mg Intravenous Daily   Continuous Infusions:  albumin human 12.5 g (08/20/18 1024)   cefTRIAXone (ROCEPHIN)  IV 2 g (08/20/18 1148)   octreotide  (SANDOSTATIN)    IV infusion Stopped (08/18/18 1434)   PRN Meds:.fentaNYL (SUBLIMAZE) injection, ipratropium-albuterol, ondansetron **OR** ondansetron (ZOFRAN) IV      Subjective:   Joseph Valenzuela was seen and examined today.  Frustrated and upset about diarrhea and increased urinary frequency.  Although also feels miserable due to marked edema up to the thighs.  No fevers or chills.  No vomiting or any significant abdominal pain. Objective:   Vitals:   08/20/18 0009 08/20/18 0433 08/20/18 0747 08/20/18 1136  BP: 115/67 101/64 115/65 119/73  Pulse: 63 70 74 71  Resp: 16 17 14 16   Temp: 97.8 F (36.6 C) 98.1 F (36.7 C) 98.1 F (36.7 C) 97.7 F (36.5 C)  TempSrc: Oral Oral Oral Oral  SpO2: 94% 97% 97% 98%  Weight:      Height:        Intake/Output Summary (Last 24 hours) at 08/20/2018 1218 Last data filed at 08/20/2018 0854 Gross per 24 hour  Intake 440 ml  Output --  Net 440 ml     Wt Readings from Last 3 Encounters:  08/15/18 130.5 kg  05/18/18 130.5 kg  02/26/18 127.9 kg    Physical Exam  General: Alert and oriented x 3, NAD, jaundiced  Eyes: icteric Sclera,  HEENT:  Atraumatic, normocephalic  Cardiovascular: S1 S2 clear, RRR. 2+ pedal edema b/l  Respiratory: CTAB, no wheezing, rales or rhonchi  Gastrointestinal: obese, Soft, nontender, distended, NBS  Ext:2+ pedal edema bilaterally  Neuro: no new deficits  Musculoskeletal: No cyanosis,  clubbing  Skin: No rashes  Psych: Normal affect and demeanor, alert and oriented x3   Data Reviewed:  I have personally reviewed following labs and imaging studies  Micro Results Recent Results (from the past 240 hour(s))  Novel Coronavirus,NAA,(SEND-OUT TO REF LAB - TAT 24-48 hrs); Hosp Order     Status: None   Collection Time: 08/15/18 10:31 AM   Specimen: Nasopharyngeal Swab; Respiratory  Result Value Ref Range Status   SARS-CoV-2, NAA NOT DETECTED NOT DETECTED Final    Comment: (NOTE) This test was developed and its performance characteristics determined by [employer]. This test has not been FDA cleared or approved. This test has been authorized by FDA under an Emergency Use Authorization (EUA). This test is only authorized for the duration  of time the declaration that circumstances exist justifying the authorization of the emergency use of in vitro diagnostic tests for detection of SARS-CoV-2 virus and/or diagnosis of COVID-19 infection under section 564(b)(1) of the Act, 21 U.S.C. 960AVW-0(J)(8360bbb-3(b)(1), unless the authorization is terminated or revoked sooner. When diagnostic testing is negative, the possibility of a false negative result should be considered in the context of a patient's recent exposures and the presence of clinical signs and symptoms consistent with COVID-19. An individual without symptoms of COVID-19 and who is not shedding SARS-CoV-2 virus would expect to have a negative (not detected) result in this assay. Performed  At: [facility] [address] [place], [place] [id number] Jolene SchimkeNagendra Sanjai MD FA:[id number]    Coronavirus Source NASOPHARYNGEAL  Final    Comment: Performed at [facility], [address] [place], [place] [address]  SARS Coronavirus 2     Status: None   Collection Time: 08/15/18  2:19 PM  Result Value Ref Range Status   SARS Coronavirus 2 NOT DETECTED NOT DETECTED Final    Comment: (NOTE) SARS-CoV-2 target nucleic  acids are NOT DETECTED. The SARS-CoV-2 RNA is generally detectable in upper and lower respiratory specimens during the acute phase of infection.  Negative  results do not preclude SARS-CoV-2 infection, do not rule out co-infections with other pathogens, and should not be used as the sole basis for treatment or other patient management decisions.  Negative results must be combined with clinical observations, patient history, and epidemiological information. The expected result is Not Detected. Fact Sheet for Patients: http://www.biofiredefense.com/wp-content/uploads/2020/03/BIOFIRE-COVID -19-patients.pdf Fact Sheet for Healthcare Providers: http://www.biofiredefense.com/wp-content/uploads/2020/03/BIOFIRE-COVID -19-hcp.pdf This test is not yet approved or cleared by the [place] FDA and  has been authorized for detection and/or diagnosis of SARS-CoV-2 by FDA under an Emergency Use Authorization (EUA).  This EUA will remain in effec t (meaning this test can be used) for the duration of  the COVID-19 declaration under Section 564(b)(1) of the Act, 21 U.S.C. section 360bbb-3(b)(1), unless the authorization is terminated or revoked sooner. Performed at [facility], [address] [place], [place] [address]     Radiology Reports Ct Abdomen Pelvis Wo Contrast  Result Date: 08/15/2018 CLINICAL DATA:  Painless jaundice, fatigue and abdominal swelling. EXAM: CT ABDOMEN AND PELVIS WITHOUT CONTRAST TECHNIQUE: Multidetector CT imaging of the abdomen and pelvis was performed following the standard protocol without IV contrast. COMPARISON:  06/18/2013 FINDINGS: Lower chest: Small left pleural effusion noted with overlying atelectasis. The right lung base is clear. The heart is normal in size. No pericardial effusion. Marked eventration of the right hemidiaphragm with overlying atelectasis. Hepatobiliary: Hepatomegaly. The liver contour is irregular and the caudate lobe is prominent. The  hepatic fissures are also slightly prominent. Findings consistent with cirrhosis. There is also diffuse heterogeneous fatty infiltration and possible superimposed hepatitis. No obvious hepatic lesion. The gallbladder is distended and demonstrates high attenuation which could be sludge. There also several small calcified gallstones. The gallbladder wall is thickened but this could be due to ascites and low albumin. No intra or extrahepatic biliary dilatation to suggest biliary obstruction. Pancreas: No mass, inflammation or ductal dilatation. Spleen: Splenomegaly. Spleen measures 16 x 16 x 12.5 cm. No worrisome lesions. Adrenals/Urinary Tract: Adrenal glands and kidneys are unremarkable. No renal, ureteral or bladder calculi or obvious mass without contrast. Stomach/Bowel: The stomach, duodenum, small bowel and colon are grossly normal without oral contrast. No acute inflammatory changes, mass lesions or obstructive findings. Vascular/Lymphatic: Scattered atherosclerotic calcifications involving the  aorta but no aneurysm. There are numerous borderline upper abdominal lymph nodes typical with cirrhosis. Scattered retroperitoneal lymph nodes are also noted but no mass or overt adenopathy. Reproductive: The prostate gland and seminal vesicles are unremarkable. Other: Small volume abdominal/pelvic ascites. There is also diffuse mesenteric edema and diffuse body wall edema. Musculoskeletal: No significant bony findings. IMPRESSION: 1. CT findings consistent with cirrhosis and fatty infiltration of the liver. No focal hepatic lesions or evidence of biliary obstruction. There could be superimposed hepatitis. 2. Evidence of portal venous hypertension with portal venous collaterals, splenomegaly and ascites. 3. Distended gallbladder with high attenuation material which could suggest sludge. Several gallstones are also noted. Gallbladder wall thickening likely due to ascites and low albumin. 4. Small left pleural effusion with  overlying atelectasis. 5. Diffuse body wall edema. Electronically Signed   By: Rudie MeyerP.  Gallerani M.D.   On: 08/15/2018 11:53   [place] Renal  Result Date: 08/16/2018 CLINICAL DATA:  Initial evaluation for ascites. EXAM: RENAL / URINARY TRACT ULTRASOUND COMPLETE COMPARISON:  None available FINDINGS: Right Kidney: Renal measurements: 12.5 x 5.5 x 6.1 cm = volume: 216.9 mL . Echogenicity within normal limits. No mass or hydronephrosis visualized. Left Kidney: Renal measurements: 13.6 x 6.2 x 7.1 cm = volume: 309.8 mL. Echogenicity within normal limits. No mass or hydronephrosis visualized. Bladder: Appears normal for degree of bladder distention. IMPRESSION: Normal renal ultrasound. No evidence for hydronephrosis or other acute finding. Electronically Signed   By: Rise MuBenjamin  McClintock M.D.   On: 08/16/2018 20:11   [place] Abdomen Limited  Result Date: 08/16/2018 CLINICAL DATA:  43 year old male with abdominal distension. Evaluate for ascites. EXAM: LIMITED ABDOMEN ULTRASOUND FOR ASCITES TECHNIQUE: Limited ultrasound survey for ascites was performed in all four abdominal quadrants. COMPARISON:  None. FINDINGS: A small amount of ascites is noted adjacent to the liver. A tiny amount ascites is noted pelvis. IMPRESSION: Small amount of ascites, primarily along the liver. Electronically Signed   By: Harmon PierJeffrey  Hu M.D.   On: 08/16/2018 12:18   Dg Chest Portable 1 View  Result Date: 08/15/2018 CLINICAL DATA:  Anemia.  Vomiting. EXAM: PORTABLE CHEST 1 VIEW COMPARISON:  January 01, 2018 FINDINGS: Minimal opacity in the lateral left lung base. There is an elevated right hemidiaphragm. There is also opacity in the right lung base. The cardiomediastinal silhouette is normal. IMPRESSION: 1. The opacity in the right lung base may represent a pleural effusion and atelectasis. Infiltrate not excluded. 2. Mild opacity in the lateral left lung base favored represent atelectasis. Subtle infiltrate not excluded. Electronically Signed   By:  Gerome Samavid  Williams III M.D   On: 08/15/2018 10:47    Lab Data:  CBC: Recent Labs  Lab 08/15/18 0915 08/15/18 2033 08/16/18 0324 08/17/18 0357 08/18/18 0457 08/19/18 0344 08/20/18 0343  WBC 8.2 6.4 5.9 5.3 5.4 6.0 6.8  NEUTROABS 6.1 4.9  --   --   --   --   --   HGB 10.8* 10.5* 10.1* 8.1* 9.4* 9.3* 9.1*  HCT 30.4* 28.7* 27.7* 22.1* 25.6* 25.2* 25.0*  MCV 88.6 87.8 88.2 87.0 88.6 88.1 89.6  PLT 170 133* 140* 132* 123* 119* 109*   Basic Metabolic Panel: Recent Labs  Lab 08/15/18 0930 08/16/18 0324 08/17/18 0357 08/18/18 0457 08/19/18 0344 08/20/18 0343  NA  --  123* 125* 125* 127* 130*  K  --  5.1 4.2 4.1 3.4* 3.3*  CL  --  90* 83* 89* 92* 95*  CO2  --  20* 27 23 21*  20*  GLUCOSE  --  89 78 155* 130* 114*  BUN  --  37* 41* 48* 49* 51*  CREATININE  --  2.78* 2.51* 2.25* 1.73* 1.44*  CALCIUM  --  7.9* 7.5* 8.4* 8.7* 9.3  MG 1.6*  --   --   --   --   --    GFR: Estimated Creatinine Clearance: 90.3 mL/min (A) (by C-G formula based on SCr of 1.44 mg/dL (H)). Liver Function Tests: Recent Labs  Lab 08/16/18 0324 08/17/18 0357 08/18/18 0457 08/19/18 0344 08/20/18 0343  AST 215* 189* 161* 139* 139*  ALT 60* 64* 73* 75* 84*  ALKPHOS 168* 158* 161* 143* 126  BILITOT 30.3* 34.4* 39.2* 39.7* 40.2*  PROT 6.0* 6.0* 6.5 6.4* 6.3*  ALBUMIN 2.2* 2.5* 2.6* 2.7* 2.8*   Recent Labs  Lab 08/15/18 0915  LIPASE 102*   Recent Labs  Lab 08/15/18 0915 08/18/18 0457  AMMONIA 49* 71*   Coagulation Profile: Recent Labs  Lab 08/16/18 0324 08/17/18 0357 08/18/18 0457 08/19/18 0344 08/20/18 0343  INR 2.5* 2.4* 2.0* 2.1* 2.1*   Cardiac Enzymes: Recent Labs  Lab 08/15/18 0915  TROPONINI <0.03   BNP (last 3 results) No results for input(s): PROBNP in the last 8760 hours. HbA1C: No results for input(s): HGBA1C in the last 72 hours. CBG: Recent Labs  Lab 08/18/18 1957  GLUCAP 139*   Lipid Profile: No results for input(s): CHOL, HDL, LDLCALC, TRIG, CHOLHDL,  LDLDIRECT in the last 72 hours. Thyroid Function Tests: No results for input(s): TSH, T4TOTAL, FREET4, T3FREE, THYROIDAB in the last 72 hours. Anemia Panel: No results for input(s): VITAMINB12, FOLATE, FERRITIN, TIBC, IRON, RETICCTPCT in the last 72 hours. Urine analysis:    Component Value Date/Time   COLORURINE AMBER (A) 08/15/2018 2212   APPEARANCEUR CLOUDY (A) 08/15/2018 2212   LABSPEC 1.017 08/15/2018 2212   PHURINE 5.0 08/15/2018 2212   GLUCOSEU 50 (A) 08/15/2018 2212   HGBUR LARGE (A) 08/15/2018 2212   BILIRUBINUR MODERATE (A) 08/15/2018 2212   KETONESUR 5 (A) 08/15/2018 2212   PROTEINUR 100 (A) 08/15/2018 2212   UROBILINOGEN 0.2 11/29/2009 0506   NITRITE NEGATIVE 08/15/2018 2212   LEUKOCYTESUR NEGATIVE 08/15/2018 2212     Antone Summons M.D. [facility] 08/20/2018, 12:18 PM  Pager: [phone] Between 7am to 7pm - call Pager - [phone]  After 7pm go to [email or web address] - password [email or web address]  Call night coverage person covering after 7pm

## 2018-08-20 NOTE — Progress Notes (Addendum)
Daily Rounding Note  08/20/2018, 12:35 PM  LOS: 5 days   SUBJECTIVE:   Chief complaint: Decompensated alcoholic cirrhosis, severe alcoholic hepatitis.     4 to 5 BM's yesterday.  Appetite poor, no nausea, no abd pain.  Tolerating soft diet.    OBJECTIVE:         Vital signs in last 24 hours:    Temp:  [97.7 F (36.5 C)-98.1 F (36.7 C)] 97.7 F (36.5 C) (06/22 1136) Pulse Rate:  [63-74] 71 (06/22 1136) Resp:  [14-17] 16 (06/22 1136) BP: (101-121)/(64-73) 119/73 (06/22 1136) SpO2:  [94 %-98 %] 98 % (06/22 1136) Last BM Date: 08/20/18 Filed Weights   08/15/18 1237  Weight: 130.5 kg   General: Jaundiced, though not as yellow as would be expected given the T bili of 40.  Alert, sitting up at the side of the bed eating his lunch. Heart: RRR. Chest: Clear bilaterally no labored breathing. Abdomen: Obese, soft, nontender. Extremities: Bilateral edema and dusky coloring consistent with venous stasis. Neuro/Psych: Oriented x3.  No asterixis.  More alert and speech is quicker than it was a few days ago when I last saw the patient.  Intake/Output from previous day: 06/21 0701 - 06/22 0700 In: 200 [IV Piggyback:200] Out: -   Intake/Output this shift: Total I/O In: 240 [P.O.:240] Out: -   Lab Results: Recent Labs    08/18/18 0457 08/19/18 0344 08/20/18 0343  WBC 5.4 6.0 6.8  HGB 9.4* 9.3* 9.1*  HCT 25.6* 25.2* 25.0*  PLT 123* 119* 109*   BMET Recent Labs    08/18/18 0457 08/19/18 0344 08/20/18 0343  NA 125* 127* 130*  K 4.1 3.4* 3.3*  CL 89* 92* 95*  CO2 23 21* 20*  GLUCOSE 155* 130* 114*  BUN 48* 49* 51*  CREATININE 2.25* 1.73* 1.44*  CALCIUM 8.4* 8.7* 9.3   LFT Recent Labs    08/18/18 0457 08/18/18 0724 08/19/18 0344 08/20/18 0343  PROT 6.5  --  6.4* 6.3*  ALBUMIN 2.6*  --  2.7* 2.8*  AST 161*  --  139* 139*  ALT 73*  --  75* 84*  ALKPHOS 161*  --  143* 126  BILITOT 39.2*  --  39.7* 40.2*   BILIDIR  --  25.7*  --   --    PT/INR Recent Labs    08/19/18 0344 08/20/18 0343  LABPROT 23.2* 23.4*  INR 2.1* 2.1*   Hepatitis Panel No results for input(s): HEPBSAG, HCVAB, HEPAIGM, HEPBIGM in the last 72 hours.  Studies/Results: No results found.   Scheduled Meds: . folic acid  1 mg Oral Daily  . lactulose  20 g Oral TID  . magic mouthwash  10 mL Oral QID  . midodrine  10 mg Oral TID WC  . multivitamin with minerals  1 tablet Oral Daily  . pantoprazole (PROTONIX) IV  40 mg Intravenous Q12H  . prednisoLONE  40 mg Oral Daily  . thiamine  100 mg Oral Daily   Or  . thiamine  100 mg Intravenous Daily   Continuous Infusions: . albumin human 12.5 g (08/20/18 1024)  . cefTRIAXone (ROCEPHIN)  IV 2 g (08/20/18 1148)  . octreotide  (SANDOSTATIN)    IV infusion Stopped (08/18/18 1434)   PRN Meds:.fentaNYL (SUBLIMAZE) injection, ipratropium-albuterol, ondansetron **OR** ondansetron (ZOFRAN) IV   ASSESMENT:   *   Severe alcoholic hepatitis.  Underlying alcoholic cirrhosis. Day 5 of prednisolone.  T bili continues to  rise.  Alkaline phosphatase elevation resolved.  Transaminitis improving.  *    Bleeding esophageal varices, banded 6/18.  6/18 EGD: banding with eradication of grade 2, mildly bleeding esoph varices.  White plaques in esophagus, likeley candidiasis, not bx'd due to coagulopathy.  Severe portal hypertensive gastropathy.    completed octreotide on 6/20 after ~ 48 hours.  Day 4/6 Rocephin.  Day 4 Magic mouthwash.    *    ABL anemia.  Stable.  Not required PRBCs.  *   Thrombocytopenia.    *   Coagulopathy.  Persists despite IV vitamin K.  Received FFP pre EGD.    *    AKI, occasionally low urine sodium.  Suspect HRS.  Creatinine improved.  Midodrine continues, day 5.  Day 5 Albumin TID albumin infusions Completed Octreotide though not clear if renal wished for this to continue.  .    *    Hyponatremia, persists but improved.  *     Hypokalemia.  Just swallowed  oral potassium.  *   Borderline hepatic encephalopathy.  On lactulose.   PLAN   *   Note from renal MD yesterday mentions stopping albumin due to suspected volume overload but he continues on this medication. Should we continue this?  *   Though the bilirubin is rising which strongly suggests he is not responding to the prednisolone, will continue this for 7 days.  Complete Lille score at day 7,  A score > 0.45 suggest patient not responding to prednisolone.  *   Drop dose of lactulose to 10 g BID.    Azucena Freed  08/20/2018, 12:35 PM Phone [phone]   Attending physician's note   I have taken an interval history, reviewed the chart and examined the patient. I agree with the Advanced Practitioner's note, impression and recommendations.   Alcoholic cirrhosis decompensated with ascites, hepatic encephalopathy and variceal hemorrhage.  Acute alcoholic hepatitis with ZS>01 started on prednisolone 08/16/2018  Bilirubin continues to rise 40.2, day 5 of prednisolone.  If no improvement of bilirubin by day 7 will discontinue prednisolone due to lack of response  Meld score 32, overall poor prognosis  Continue supportive care   Damaris Hippo , MD [phone]

## 2018-08-21 ENCOUNTER — Inpatient Hospital Stay (HOSPITAL_COMMUNITY): Payer: Medicaid Other

## 2018-08-21 DIAGNOSIS — D62 Acute posthemorrhagic anemia: Secondary | ICD-10-CM

## 2018-08-21 DIAGNOSIS — N179 Acute kidney failure, unspecified: Secondary | ICD-10-CM

## 2018-08-21 DIAGNOSIS — K922 Gastrointestinal hemorrhage, unspecified: Secondary | ICD-10-CM

## 2018-08-21 LAB — CBC
HCT: 25.1 % — ABNORMAL LOW (ref 39.0–52.0)
Hemoglobin: 9.2 g/dL — ABNORMAL LOW (ref 13.0–17.0)
MCH: 32.6 pg (ref 26.0–34.0)
MCHC: 36.7 g/dL — ABNORMAL HIGH (ref 30.0–36.0)
MCV: 89 fL (ref 80.0–100.0)
Platelets: 107 10*3/uL — ABNORMAL LOW (ref 150–400)
RBC: 2.82 MIL/uL — ABNORMAL LOW (ref 4.22–5.81)
RDW: 19.2 % — ABNORMAL HIGH (ref 11.5–15.5)
WBC: 7.3 10*3/uL (ref 4.0–10.5)
nRBC: 0.3 % — ABNORMAL HIGH (ref 0.0–0.2)

## 2018-08-21 LAB — COMPREHENSIVE METABOLIC PANEL
ALT: 94 U/L — ABNORMAL HIGH (ref 0–44)
AST: 136 U/L — ABNORMAL HIGH (ref 15–41)
Albumin: 2.9 g/dL — ABNORMAL LOW (ref 3.5–5.0)
Alkaline Phosphatase: 120 U/L (ref 38–126)
Anion gap: 14 (ref 5–15)
BUN: 51 mg/dL — ABNORMAL HIGH (ref 6–20)
CO2: 23 mmol/L (ref 22–32)
Calcium: 9.2 mg/dL (ref 8.9–10.3)
Chloride: 94 mmol/L — ABNORMAL LOW (ref 98–111)
Creatinine, Ser: 1.3 mg/dL — ABNORMAL HIGH (ref 0.61–1.24)
GFR calc Af Amer: >60 mL/min (ref >60)
GFR calc non Af Amer: >60 mL/min (ref >60)
Glucose, Bld: 101 mg/dL — ABNORMAL HIGH (ref 70–99)
Potassium: 3.1 mmol/L — ABNORMAL LOW (ref 3.5–5.1)
Sodium: 131 mmol/L — ABNORMAL LOW (ref 135–145)
Total Bilirubin: 39.7 mg/dL (ref 0.3–1.2)
Total Protein: 6.6 g/dL (ref 6.5–8.1)

## 2018-08-21 LAB — PROTIME-INR
INR: 2.1 — ABNORMAL HIGH (ref 0.8–1.2)
Prothrombin Time: 22.9 seconds — ABNORMAL HIGH (ref 11.4–15.2)

## 2018-08-21 MED ORDER — POTASSIUM CHLORIDE CRYS ER 20 MEQ PO TBCR
40.0000 meq | EXTENDED_RELEASE_TABLET | Freq: Every day | ORAL | Status: DC
  Administered 2018-08-22: 09:00:00 40 meq via ORAL
  Filled 2018-08-21: qty 2

## 2018-08-21 MED ORDER — POTASSIUM CHLORIDE CRYS ER 20 MEQ PO TBCR
40.0000 meq | EXTENDED_RELEASE_TABLET | Freq: Once | ORAL | Status: AC
  Administered 2018-08-21: 07:00:00 40 meq via ORAL
  Filled 2018-08-21: qty 2

## 2018-08-21 MED ORDER — PANTOPRAZOLE SODIUM 40 MG PO TBEC
40.0000 mg | DELAYED_RELEASE_TABLET | Freq: Two times a day (BID) | ORAL | Status: DC
  Administered 2018-08-21 – 2018-08-22 (×3): 40 mg via ORAL
  Filled 2018-08-21 (×3): qty 1

## 2018-08-21 MED ORDER — FUROSEMIDE 10 MG/ML IJ SOLN: 40.0000 mg | Freq: Two times a day (BID) | INTRAMUSCULAR | Status: DC

## 2018-08-21 MED ORDER — LACTULOSE 10 GM/15ML PO SOLN
20.0000 g | Freq: Two times a day (BID) | ORAL | Status: DC
  Administered 2018-08-21 – 2018-08-25 (×8): 20 g via ORAL
  Filled 2018-08-21 (×8): qty 30

## 2018-08-21 MED ORDER — FUROSEMIDE 10 MG/ML IJ SOLN
40.0000 mg | Freq: Three times a day (TID) | INTRAMUSCULAR | Status: DC
  Administered 2018-08-21 (×2): 40 mg via INTRAVENOUS
  Filled 2018-08-21 (×2): qty 4

## 2018-08-21 NOTE — Progress Notes (Signed)
[facility]                                                                              Patient Demographics  Joseph Valenzuela, is a 43 y.o. male, DOB - [birth year], RUE:[id number]  Admit date - 08/15/2018   Admitting Physician Carron Curie, MD  Outpatient Primary MD for the patient is Kristian Covey, MD  Outpatient specialists:   LOS - 6  days   Medical records reviewed and are as summarized below:    Chief Complaint  Patient presents with   Diarrhea   Jaundice       Brief summary   Patient is a 43 year old male with hypertension, depression, Barrett's esophagus, alcoholism presented with 2-week history of nausea, vomiting, hematemesis, right upper quadrant abdominal pain.  Patient has been drinking a lot, fifth almost every day.   In ED, patient was noted to be jaundiced, sodium 120, potassium 5.2, creatinine 2.48, hemoglobin 10.8.  Elevated LFTs with total bili of 28.5. GI was consulted. COVID-19 test negative  Assessment & Plan    Principal problem Acute upper GI bleed, acute blood loss anemia -Patient has a history of GERD, Barrett's esophagus, possible EtOH gastritis or portal hypertensive gastritis.  FOBT positive. 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  H&H currently stable.  Completed IV PPI and octreotide drip.   -Continue PPI  Active Problems: Acute alcoholic hepatitis, transaminitis with hyperbilirubinemia with underlying cirrhosis, jaundice -Patient has been drinking heavily, states fifth of hard liquor almost every day -CT abdomen and pelvis showed cirrhosis, fatty infiltration of the liver, no focal hepatic lesions or evidence of biliary obstruction, could be superimposed hepatitis.  Evidence of portal venous hypertension with portal venous collateral splenomegaly and  ascites.  Distended gallbladder, suggests sludge, several gallstones. -Continue IV Rocephin for SBP prophylaxis, completed octreotide. -Continue prednisolone, lactulose -Total bili peaked at 40.2, trending down today 39.7, overall poor prognosis  -Follow abdominal ultrasound, may need paracentesis if ascites  Coagulopathy: Secondary to liver cirrhosis -Received vitamin K, FFP, INR 2.1  Acute kidney injury:   Hepatorenal syndrome, marked volume overload  -Creatinine 2.4 at the time of admission, in 01/2018, creatinine was 0.8.   - continue to hold lisinopril, HCTZ Creatinine improving 1.3 -Completed octreotide, IV albumin discontinued.  Continue midodrine -Nephrology following, still marked volume overload, 5.4 L positive, placed on Lasix 40 mg IV every 8 hours x4, reassess in a.m.  Hypokalemia -Replaced  Hyponatremia with lactic acidosis Improving, sodium 120 at the time of admission with hyperkalemia, patient was placed on IV fluids.  -Continue diuresis, follow renal function closely  History of alcohol abuse -Heavy drinker, placed on CIWA scale with Ativan, high risk of withdrawals -Continue thiamine, folate, MVI  Hypotension -BP currently stable, continue midodrine  Code Status: Full CODE STATUS DVT Prophylaxis:   SCD's Family Communication: Discussed in detail with the patient, all imaging results, lab results explained to the patient.  Patient does not want his family members to be called   Disposition  Plan: Continue stepdown status with high mortality, acute  alcoholic hepatitis, upper GI bleed, hypotensive, hepatorenal syndrome.  Patient requested case management consult for financial help, consult placed  Time Spent in minutes 25 minutes  Procedures:  EGD  Consultants:   Gastroenterology Nephrology CCM  Antimicrobials:   Anti-infectives (From admission, onward)   Start     Dose/Rate Route Frequency Ordered Stop   08/17/18 1200  cefTRIAXone (ROCEPHIN) 2 g in  sodium chloride 0.9 % 100 mL IVPB     2 g 200 mL/hr over 30 Minutes Intravenous Every 24 hours 08/17/18 1145 08/21/18 1337         Medications  Scheduled Meds:  folic acid  1 mg Oral Daily   furosemide  40 mg Intravenous Q8H   lactulose  20 g Oral BID   magic mouthwash  10 mL Oral QID   midodrine  10 mg Oral TID WC   multivitamin with minerals  1 tablet Oral Daily   pantoprazole (PROTONIX) IV  40 mg Intravenous Q12H   [START ON 08/22/2018] potassium chloride  40 mEq Oral Daily   prednisoLONE  40 mg Oral Daily   thiamine  100 mg Oral Daily   Or   thiamine  100 mg Intravenous Daily   Continuous Infusions:  octreotide  (SANDOSTATIN)    IV infusion Stopped (08/18/18 1616)   PRN Meds:.fentaNYL (SUBLIMAZE) injection, ipratropium-albuterol, ondansetron **OR** ondansetron (ZOFRAN) IV      Subjective:   Joseph Valenzuela was seen and examined today.  No acute complaints, feels miserable with lower extremity swelling.  Poor appetite.  Abdominal distention.  No fevers or chills, no nausea or vomiting.     Objective:   Vitals:   08/21/18 0342 08/21/18 0604 08/21/18 0904 08/21/18 1249  BP: 121/67  (!) 107/58 108/63  Pulse: 72  75 75  Resp: 16  18 16   Temp: (!) 97.3 F (36.3 C)  97.7 F (36.5 C) (!) 97.4 F (36.3 C)  TempSrc: Oral  Oral   SpO2: 96%  96% 97%  Weight:  132 kg    Height:        Intake/Output Summary (Last 24 hours) at 08/21/2018 1404 Last data filed at 08/21/2018 1307 Gross per 24 hour  Intake 654.55 ml  Output 2225 ml  Net -1570.45 ml     Wt Readings from Last 3 Encounters:  08/21/18 132 kg  05/18/18 130.5 kg  02/26/18 127.9 kg   Physical Exam  General: Alert and oriented x 3, NAD, jaundiced  Eyes: icteric Sclera,  HEENT:  Atraumatic, normocephalic  Cardiovascular: S1 S2 clear, no murmurs, RRR.   Respiratory: CTAB, no wheezing, rales or rhonchi  Gastrointestinal: Soft, nontender, distended, NBS  Ext: Anasarca with marked lower  extremity edema 3+  Neuro: no new deficits  Musculoskeletal: No cyanosis, clubbing  Skin: No rashes  Psych: Normal affect and demeanor, alert and oriented x3   Data Reviewed:  I have personally reviewed following labs and imaging studies  Micro Results Recent Results (from the past 240 hour(s))  Novel Coronavirus,NAA,(SEND-OUT TO REF LAB - TAT 24-48 hrs); Hosp Order     Status: None   Collection Time: 08/15/18 10:31 AM   Specimen: Nasopharyngeal Swab; Respiratory  Result Value Ref Range Status   SARS-CoV-2, NAA NOT DETECTED NOT DETECTED Final    Comment: (NOTE) This test was developed and its performance characteristics determined by [facility]. This test has not been FDA cleared or approved. This test has been authorized by FDA  under an Emergency Use Authorization (EUA). This test is only authorized for the duration of time the declaration that circumstances exist justifying the authorization of the emergency use of in vitro diagnostic tests for detection of SARS-CoV-2 virus and/or diagnosis of COVID-19 infection under section 564(b)(1) of the Act, 21 U.S.C. 250NLZ-7(Q)(7), unless the authorization is terminated or revoked sooner. When diagnostic testing is negative, the possibility of a false negative result should be considered in the context of a patient's recent exposures and the presence of clinical signs and symptoms consistent with COVID-19. An individual without symptoms of COVID-19 and who is not shedding SARS-CoV-2 virus would expect to have a negative (not detected) result in this assay. Performed  At: [facility] [address] [place], [place] [id number] Rush Farmer MD IO:[id number]    [place]  Final    Comment: Performed at [facility], [place] [address], [place], [place] [address]  SARS Coronavirus 2     Status: None   Collection Time: 08/15/18  2:19 PM  Result Value Ref Range Status   SARS Coronavirus 2  NOT DETECTED NOT DETECTED Final    Comment: (NOTE) SARS-CoV-2 target nucleic acids are NOT DETECTED. The SARS-CoV-2 RNA is generally detectable in upper and lower respiratory specimens during the acute phase of infection.  Negative  results do not preclude SARS-CoV-2 infection, do not rule out co-infections with other pathogens, and should not be used as the sole basis for treatment or other patient management decisions.  Negative results must be combined with clinical observations, patient history, and epidemiological information. The expected result is Not Detected. Fact Sheet for Patients: http://www.biofiredefense.com/wp-content/uploads/2020/03/BIOFIRE-COVID -19-patients.pdf Fact Sheet for Healthcare Providers: http://www.biofiredefense.com/wp-content/uploads/2020/03/BIOFIRE-COVID -19-hcp.pdf This test is not yet approved or cleared by the [place] and  has been authorized for detection and/or diagnosis of SARS-CoV-2 by FDA under an Emergency Use Authorization (EUA).  This EUA will remain in effec t (meaning this test can be used) for the duration of  the COVID-19 declaration under Section 564(b)(1) of the Act, 21 U.S.C. section 360bbb-3(b)(1), unless the authorization is terminated or revoked sooner. Performed at [facility], [place] [address] [place], [place] [address]     Radiology Reports Ct Abdomen Pelvis Wo Contrast  Result Date: 08/15/2018 CLINICAL DATA:  Painless jaundice, fatigue and abdominal swelling. EXAM: CT ABDOMEN AND PELVIS WITHOUT CONTRAST TECHNIQUE: Multidetector CT imaging of the abdomen and pelvis was performed following the standard protocol without IV contrast. COMPARISON:  06/18/2013 FINDINGS: Lower chest: Small left pleural effusion noted with overlying atelectasis. The right lung base is clear. The heart is normal in size. No pericardial effusion. Marked eventration of the right hemidiaphragm with overlying atelectasis. Hepatobiliary:  Hepatomegaly. The liver contour is irregular and the caudate lobe is prominent. The hepatic fissures are also slightly prominent. Findings consistent with cirrhosis. There is also diffuse heterogeneous fatty infiltration and possible superimposed hepatitis. No obvious hepatic lesion. The gallbladder is distended and demonstrates high attenuation which could be sludge. There also several small calcified gallstones. The gallbladder wall is thickened but this could be due to ascites and low albumin. No intra or extrahepatic biliary dilatation to suggest biliary obstruction. Pancreas: No mass, inflammation or ductal dilatation. Spleen: Splenomegaly. Spleen measures 16 x 16 x 12.5 cm. No worrisome lesions. Adrenals/Urinary Tract: Adrenal glands and kidneys are unremarkable. No renal, ureteral or bladder calculi or obvious mass without contrast. Stomach/Bowel: The stomach, duodenum, small bowel and colon are grossly normal without oral contrast. No  acute inflammatory changes, mass lesions or obstructive findings. Vascular/Lymphatic: Scattered atherosclerotic calcifications involving the aorta but no aneurysm. There are numerous borderline upper abdominal lymph nodes typical with cirrhosis. Scattered retroperitoneal lymph nodes are also noted but no mass or overt adenopathy. Reproductive: The prostate gland and seminal vesicles are unremarkable. Other: Small volume abdominal/pelvic ascites. There is also diffuse mesenteric edema and diffuse body wall edema. Musculoskeletal: No significant bony findings. IMPRESSION: 1. CT findings consistent with cirrhosis and fatty infiltration of the liver. No focal hepatic lesions or evidence of biliary obstruction. There could be superimposed hepatitis. 2. Evidence of portal venous hypertension with portal venous collaterals, splenomegaly and ascites. 3. Distended gallbladder with high attenuation material which could suggest sludge. Several gallstones are also noted. Gallbladder wall  thickening likely due to ascites and low albumin. 4. Small left pleural effusion with overlying atelectasis. 5. Diffuse body wall edema. Electronically Signed   By: Rudie MeyerP.  Gallerani M.D.   On: 08/15/2018 11:53   [place] Renal  Result Date: 08/16/2018 CLINICAL DATA:  Initial evaluation for ascites. EXAM: RENAL / URINARY TRACT ULTRASOUND COMPLETE COMPARISON:  None available FINDINGS: Right Kidney: Renal measurements: 12.5 x 5.5 x 6.1 cm = volume: 216.9 mL . Echogenicity within normal limits. No mass or hydronephrosis visualized. Left Kidney: Renal measurements: 13.6 x 6.2 x 7.1 cm = volume: 309.8 mL. Echogenicity within normal limits. No mass or hydronephrosis visualized. Bladder: Appears normal for degree of bladder distention. IMPRESSION: Normal renal ultrasound. No evidence for hydronephrosis or other acute finding. Electronically Signed   By: Rise MuBenjamin  McClintock M.D.   On: 08/16/2018 20:11   [place] Abdomen Limited  Result Date: 08/16/2018 CLINICAL DATA:  43 year old male with abdominal distension. Evaluate for ascites. EXAM: LIMITED ABDOMEN ULTRASOUND FOR ASCITES TECHNIQUE: Limited ultrasound survey for ascites was performed in all four abdominal quadrants. COMPARISON:  None. FINDINGS: A small amount of ascites is noted adjacent to the liver. A tiny amount ascites is noted pelvis. IMPRESSION: Small amount of ascites, primarily along the liver. Electronically Signed   By: Harmon PierJeffrey  Hu M.D.   On: 08/16/2018 12:18   Dg Chest Portable 1 View  Result Date: 08/15/2018 CLINICAL DATA:  Anemia.  Vomiting. EXAM: PORTABLE CHEST 1 VIEW COMPARISON:  January 01, 2018 FINDINGS: Minimal opacity in the lateral left lung base. There is an elevated right hemidiaphragm. There is also opacity in the right lung base. The cardiomediastinal silhouette is normal. IMPRESSION: 1. The opacity in the right lung base may represent a pleural effusion and atelectasis. Infiltrate not excluded. 2. Mild opacity in the lateral left lung base favored  represent atelectasis. Subtle infiltrate not excluded. Electronically Signed   By: Gerome Samavid  Williams III M.D   On: 08/15/2018 10:47    Lab Data:  CBC: Recent Labs  Lab 08/15/18 0915 08/15/18 2033  08/17/18 0357 08/18/18 0457 08/19/18 0344 08/20/18 0343 08/21/18 0409  WBC 8.2 6.4   < > 5.3 5.4 6.0 6.8 7.3  NEUTROABS 6.1 4.9  --   --   --   --   --   --   HGB 10.8* 10.5*   < > 8.1* 9.4* 9.3* 9.1* 9.2*  HCT 30.4* 28.7*   < > 22.1* 25.6* 25.2* 25.0* 25.1*  MCV 88.6 87.8   < > 87.0 88.6 88.1 89.6 89.0  PLT 170 133*   < > 132* 123* 119* 109* 107*   < > = values in this interval not displayed.   Basic Metabolic Panel: Recent Labs  Lab 08/15/18 0930  08/17/18 0357 08/18/18 0457 08/19/18 0344 08/20/18 0343 08/21/18 0409  NA  --    < > 125* 125* 127* 130* 131*  K  --    < > 4.2 4.1 3.4* 3.3* 3.1*  CL  --    < > 83* 89* 92* 95* 94*  CO2  --    < > 27 23 21* 20* 23  GLUCOSE  --    < > 78 155* 130* 114* 101*  BUN  --    < > 41* 48* 49* 51* 51*  CREATININE  --    < > 2.51* 2.25* 1.73* 1.44* 1.30*  CALCIUM  --    < > 7.5* 8.4* 8.7* 9.3 9.2  MG 1.6*  --   --   --   --   --   --    < > = values in this interval not displayed.   GFR: Estimated Creatinine Clearance: 100.7 mL/min (A) (by C-G formula based on SCr of 1.3 mg/dL (H)). Liver Function Tests: Recent Labs  Lab 08/17/18 0357 08/18/18 0457 08/19/18 0344 08/20/18 0343 08/21/18 0409  AST 189* 161* 139* 139* 136*  ALT 64* 73* 75* 84* 94*  ALKPHOS 158* 161* 143* 126 120  BILITOT 34.4* 39.2* 39.7* 40.2* 39.7*  PROT 6.0* 6.5 6.4* 6.3* 6.6  ALBUMIN 2.5* 2.6* 2.7* 2.8* 2.9*   Recent Labs  Lab 08/15/18 0915  LIPASE 102*   Recent Labs  Lab 08/15/18 0915 08/18/18 0457  AMMONIA 49* 71*   Coagulation Profile: Recent Labs  Lab 08/17/18 0357 08/18/18 0457 08/19/18 0344 08/20/18 0343 08/21/18 0409  INR 2.4* 2.0* 2.1* 2.1* 2.1*   Cardiac Enzymes: Recent Labs  Lab 08/15/18 0915  TROPONINI <0.03   BNP (last 3  results) No results for input(s): PROBNP in the last 8760 hours. HbA1C: No results for input(s): HGBA1C in the last 72 hours. CBG: Recent Labs  Lab 08/18/18 1957  GLUCAP 139*   Lipid Profile: No results for input(s): CHOL, HDL, LDLCALC, TRIG, CHOLHDL, LDLDIRECT in the last 72 hours. Thyroid Function Tests: No results for input(s): TSH, T4TOTAL, FREET4, T3FREE, THYROIDAB in the last 72 hours. Anemia Panel: No results for input(s): VITAMINB12, FOLATE, FERRITIN, TIBC, IRON, RETICCTPCT in the last 72 hours. Urine analysis:    Component Value Date/Time   COLORURINE AMBER (A) 08/15/2018 2212   APPEARANCEUR CLOUDY (A) 08/15/2018 2212   LABSPEC 1.017 08/15/2018 2212   PHURINE 5.0 08/15/2018 2212   GLUCOSEU 50 (A) 08/15/2018 2212   HGBUR LARGE (A) 08/15/2018 2212   BILIRUBINUR MODERATE (A) 08/15/2018 2212   KETONESUR 5 (A) 08/15/2018 2212   PROTEINUR 100 (A) 08/15/2018 2212   UROBILINOGEN 0.2 11/29/2009 0506   NITRITE NEGATIVE 08/15/2018 2212   LEUKOCYTESUR NEGATIVE 08/15/2018 2212     Levan Aloia M.D. [facility] 08/21/2018, 2:04 PM  Pager: [phone] Between 7am to 7pm - call Pager - [phone]  After 7pm go to [email or web address] - password [email or web address]  Call night coverage person covering after 7pm

## 2018-08-21 NOTE — Progress Notes (Addendum)
Daily Rounding Note  08/21/2018, 11:43 AM  LOS: 6 days   SUBJECTIVE:   Chief complaint: Decompensated cirrhosis.  Severe alcoholic hepatitis.    Feels worse.  Pain and swelling in tops of feet to knees bil, a/w edema.  Pain in central abdomen.  Very tired 2 or 3 BM yesterday, none today.   No nausea, anorexic.    OBJECTIVE:         Vital signs in last 24 hours:    Temp:  [97.3 F (36.3 C)-98.1 F (36.7 C)] 97.7 F (36.5 C) (06/23 0904) Pulse Rate:  [64-75] 75 (06/23 0904) Resp:  [15-18] 18 (06/23 0904) BP: (107-121)/(58-72) 107/58 (06/23 0904) SpO2:  [96 %-100 %] 96 % (06/23 0904) Weight:  [132 kg] 132 kg (06/23 0604) Last BM Date: 08/20/18 Filed Weights   08/15/18 1237 08/21/18 0604  Weight: 130.5 kg 132 kg   General: Worse.  Jaundice is much more noticeable today. Heart: RRR. Chest: No labored breathing.  Clear. Abdomen: Protuberant, tender without guarding or rebound.  Bowel sounds hypoactive. Extremities: 3+ edema to at least the knees, on the top of the feet.  Beginning to develop some corrugated surface changes to texture of the skin. Neuro/Psych: Alert.  Oriented x3.  No asterixis but his speech and mentation is slower than it was yesterday.  Intake/Output from previous day: 06/22 0701 - 06/23 0700 In: 654.6 [P.O.:240; I.V.:183.1; IV Piggyback:231.5] Out: 1275 [Urine:1275]  Intake/Output this shift: Total I/O In: 240 [P.O.:240] Out: -   Lab Results: Recent Labs    08/19/18 0344 08/20/18 0343 08/21/18 0409  WBC 6.0 6.8 7.3  HGB 9.3* 9.1* 9.2*  HCT 25.2* 25.0* 25.1*  PLT 119* 109* 107*   BMET Recent Labs    08/19/18 0344 08/20/18 0343 08/21/18 0409  NA 127* 130* 131*  K 3.4* 3.3* 3.1*  CL 92* 95* 94*  CO2 21* 20* 23  GLUCOSE 130* 114* 101*  BUN 49* 51* 51*  CREATININE 1.73* 1.44* 1.30*  CALCIUM 8.7* 9.3 9.2   LFT Recent Labs    08/19/18 0344 08/20/18 0343 08/21/18 0409  PROT  6.4* 6.3* 6.6  ALBUMIN 2.7* 2.8* 2.9*  AST 139* 139* 136*  ALT 75* 84* 94*  ALKPHOS 143* 126 120  BILITOT 39.7* 40.2* 39.7*   PT/INR Recent Labs    08/20/18 0343 08/21/18 0409  LABPROT 23.4* 22.9*  INR 2.1* 2.1*   Scheduled Meds: . folic acid  1 mg Oral Daily  . furosemide  40 mg Intravenous Q8H  . lactulose  10 g Oral BID  . magic mouthwash  10 mL Oral QID  . midodrine  10 mg Oral TID WC  . multivitamin with minerals  1 tablet Oral Daily  . pantoprazole (PROTONIX) IV  40 mg Intravenous Q12H  . [START ON 08/22/2018] potassium chloride  40 mEq Oral Daily  . prednisoLONE  40 mg Oral Daily  . thiamine  100 mg Oral Daily   Or  . thiamine  100 mg Intravenous Daily   Continuous Infusions: . cefTRIAXone (ROCEPHIN)  IV Stopped (08/20/18 1218)  . octreotide  (SANDOSTATIN)    IV infusion Stopped (08/18/18 1616)   PRN Meds:.fentaNYL (SUBLIMAZE) injection, ipratropium-albuterol, ondansetron **OR** ondansetron (ZOFRAN) IV   ASSESMENT:   *   Severe alcoholic hepatitis, underlying cirrhosis. Day 6 prednisolone T bili remains markedly elevated. Abdominal pain.  Had only a small degree of ascites on ultrasound of 6/18.  *  Banding of bleeding esophageal varices 6/18.  Severe portal hypertensive gastropathy.  Esophageal candidiasis. Day 5/6 Rocephin.  Day 5 Magic mouthwash.  Completed Octreotide.    *    Blood loss anemia.  Hgb stable.    *    Thrombocytopenia.  *    Coagulopathy.  Persists.    *      AKI, low urine sodium.  Suspect HRS.  Midodrine day 6.  Albumin infusions x 5 days, stopped this AM.   Urine ouput improved, 1275 ml yesterday.  .  *   HE without acute confusion or coma.  Mentation, speech slower today.  Has not had any bowel movements yet today, 2 or 3 yesterday.  On Lactulose.    *    Hyponatremia, continues to improve but not yet normal.  *    Hypokalemia.  Additional K. Dur ordered by hospitalist.  PLAN   *   Increased dose of lactulose.  *     Repeat  abdominal ultrasound to reassess degree of ascites.  If significant, will proceed to paracentesis for rule out SBP.    Joseph Valenzuela  08/21/2018, 11:43 AM Phone [phone]   Attending physician's note   I have taken an interval history, reviewed the chart and examined the patient. I agree with the Advanced Practitioner's note, impression and recommendations.   Decompensated ETOH liver cirrhosis with portal HTN- ascites, HE and variceal hemorrhage s/p EVL 08/16/2018, coagulopathy with pancytopenia.  Acute alcoholic hepatitis with GM>01 started on prednisolone 08/16/2018  AKI vs HRS on midrodrine and octreotide. IV albumin discontinued d/t generalized anasarca /fluid overload.  On IV Lasix. Cr improving. Nephrology consult appreciated.  Bilirubin sl better today. Chronic abdominal pain "better with fentanyl".  Plan: -Continue supportive treatment. -Not a candidate for liver transplant d/t continued alcohol abuse (last drink day prior to admission) and obesity. -Can repeat ultrasound.  If ascites, tap to r/o SBP. -Overall poor prognosis. -Trend LFTs, PT, electrolytes.  Carmell Austria, MD [facility] [phone].

## 2018-08-21 NOTE — Progress Notes (Signed)
[facility]    NEPHROLOGY PROGRESS NOTE  SUBJECTIVE:  Reports swelling is persistent but improved.  He has had 3.5 liters UOP charted thus far 6/23.    Review of systems:  Denies shortness of breath  Denies n/v Denies chest pain   OBJECTIVE:  Vitals:   08/21/18 1249 08/21/18 1641  BP: 108/63 106/61  Pulse: 75 69  Resp: 16 18  Temp: (!) 97.4 F (36.3 C) 98.1 F (36.7 C)  SpO2: 97% 97%    Intake/Output Summary (Last 24 hours) at 08/21/2018 1938 Last data filed at 08/21/2018 1727 Gross per 24 hour  Intake 281.85 ml  Output 3500 ml  Net -3218.15 ml      General: Alert to self, no acute distress  HEENT: MMM [place] AT anicteric sclera  neck:  No JVD, no adenopathy CV:  Heart RRR  Lungs:  L/S CTA bilaterally Abd: Distended, nontender  GU:  Bladder non-palpable Extremities: 2-3 bilateral lower extremity edema  skin:  No skin rash, positive jaundice  MEDICATIONS:  . folic acid  1 mg Oral Daily  . furosemide  40 mg Intravenous Q8H  . lactulose  20 g Oral BID  . magic mouthwash  10 mL Oral QID  . midodrine  10 mg Oral TID WC  . multivitamin with minerals  1 tablet Oral Daily  . pantoprazole  40 mg Oral BID  . [START ON 08/22/2018] potassium chloride  40 mEq Oral Daily  . prednisoLONE  40 mg Oral Daily  . thiamine  100 mg Oral Daily   Or  . thiamine  100 mg Intravenous Daily       LABS:   CBC Latest Ref Rng & Units 08/21/2018 08/20/2018 08/19/2018  WBC 4.0 - 10.5 K/uL 7.3 6.8 6.0  Hemoglobin 13.0 - 17.0 g/dL 1.6(X9.2(L) 0.9(U9.1(L) 0.4(V9.3(L)  Hematocrit 39.0 - 52.0 % 25.1(L) 25.0(L) 25.2(L)  Platelets 150 - 400 K/uL 107(L) 109(L) 119(L)    CMP Latest Ref Rng & Units 08/21/2018 08/20/2018 08/19/2018  Glucose 70 - 99 mg/dL 409(W101(H) 119(J114(H) 478(G130(H)  BUN 6 - 20 mg/dL 95(A51(H) 21(H51(H) 08(M49(H)  Creatinine 0.61 - 1.24 mg/dL 5.78(I1.30(H) 6.96(E1.44(H) 9.52(W1.73(H)  Sodium 135 - 145 mmol/L 131(L) 130(L) 127(L)  Potassium 3.5 - 5.1 mmol/L 3.1(L) 3.3(L) 3.4(L)  Chloride 98 - 111 mmol/L 94(L) 95(L)  92(L)  CO2 22 - 32 mmol/L 23 20(L) 21(L)  Calcium 8.9 - 10.3 mg/dL 9.2 9.3 4.1(L8.7(L)  Total Protein 6.5 - 8.1 g/dL 6.6 2.4(M6.3(L) 6.4(L)  Total Bilirubin 0.3 - 1.2 mg/dL 39.7(HH) 40.2(HH) 39.7(HH)  Alkaline Phos 38 - 126 U/L 120 126 143(H)  AST 15 - 41 U/L 136(H) 139(H) 139(H)  ALT 0 - 44 U/L 94(H) 84(H) 75(H)    Lab Results  Component Value Date   CALCIUM 9.2 08/21/2018   CAION 1.11 (L) 01/01/2018       Component Value Date/Time   COLORURINE AMBER (A) 08/15/2018 2212   APPEARANCEUR CLOUDY (A) 08/15/2018 2212   LABSPEC 1.017 08/15/2018 2212   PHURINE 5.0 08/15/2018 2212   GLUCOSEU 50 (A) 08/15/2018 2212   HGBUR LARGE (A) 08/15/2018 2212   BILIRUBINUR MODERATE (A) 08/15/2018 2212   KETONESUR 5 (A) 08/15/2018 2212   PROTEINUR 100 (A) 08/15/2018 2212   UROBILINOGEN 0.2 11/29/2009 0506   NITRITE NEGATIVE 08/15/2018 2212   LEUKOCYTESUR NEGATIVE 08/15/2018 2212      Component Value Date/Time   HCO3 29.8 (H) 03/25/2007 1915   TCO2 27 01/01/2018 1422       Component  Value Date/Time   IRON 119 07/05/2011 1144   FERRITIN 39.7 07/05/2011 1144   IRONPCTSAT 22.6 07/05/2011 1144       ASSESSMENT/PLAN:    43 year old male patient with a past medical history significant for hypertension, depression, Barrett's esophagus, and heavy alcohol use who presented with a two-week history of nausea, vomiting, hematemesis, and right upper quadrant pain.  He was noted to have markedly elevated LFTs and a total bili of 28.5.    1.  Alcoholic hepatitis.  On pred per primary   2.  Acute kidney injury.  Likely secondary to hepatorenal syndrome with markedly low urine sodium.   Patient appears fluid overloaded.  Creatinine is improving.  Continue octreotide and Midodrine.  Will temporarily d/c scheduled lasix (has been on 40 mg every 8 hours) to allow to refill intravascularly to avoid worsening AKI.  Baseline serum creatinine 0.8 from 02/26/2018.  3. Hypokalemia - repleted; would check and replete mag     4.  Acute liver failure.  Overall poor prognosis.  Bilirubin increasing last assessment  5.  Anemia - stable.  No acute indication for PRBC's.

## 2018-08-22 DIAGNOSIS — E871 Hypo-osmolality and hyponatremia: Secondary | ICD-10-CM

## 2018-08-22 DIAGNOSIS — K219 Gastro-esophageal reflux disease without esophagitis: Secondary | ICD-10-CM

## 2018-08-22 LAB — COMPREHENSIVE METABOLIC PANEL
ALT: 108 U/L — ABNORMAL HIGH (ref 0–44)
AST: 148 U/L — ABNORMAL HIGH (ref 15–41)
Albumin: 2.8 g/dL — ABNORMAL LOW (ref 3.5–5.0)
Alkaline Phosphatase: 126 U/L (ref 38–126)
Anion gap: 14 (ref 5–15)
BUN: 49 mg/dL — ABNORMAL HIGH (ref 6–20)
CO2: 25 mmol/L (ref 22–32)
Calcium: 9.7 mg/dL (ref 8.9–10.3)
Chloride: 93 mmol/L — ABNORMAL LOW (ref 98–111)
Creatinine, Ser: 1.27 mg/dL — ABNORMAL HIGH (ref 0.61–1.24)
GFR calc Af Amer: >60 mL/min (ref >60)
GFR calc non Af Amer: >60 mL/min (ref >60)
Glucose, Bld: 111 mg/dL — ABNORMAL HIGH (ref 70–99)
Potassium: 2.8 mmol/L — ABNORMAL LOW (ref 3.5–5.1)
Sodium: 132 mmol/L — ABNORMAL LOW (ref 135–145)
Total Bilirubin: 41.5 mg/dL (ref 0.3–1.2)
Total Protein: 6.4 g/dL — ABNORMAL LOW (ref 6.5–8.1)

## 2018-08-22 LAB — MAGNESIUM: Magnesium: 2.2 mg/dL (ref 1.7–2.4)

## 2018-08-22 LAB — CBC
HCT: 26.9 % — ABNORMAL LOW (ref 39.0–52.0)
Hemoglobin: 9.8 g/dL — ABNORMAL LOW (ref 13.0–17.0)
MCH: 32.2 pg (ref 26.0–34.0)
MCHC: 36.4 g/dL — ABNORMAL HIGH (ref 30.0–36.0)
MCV: 88.5 fL (ref 80.0–100.0)
Platelets: 105 10*3/uL — ABNORMAL LOW (ref 150–400)
RBC: 3.04 MIL/uL — ABNORMAL LOW (ref 4.22–5.81)
RDW: 19.3 % — ABNORMAL HIGH (ref 11.5–15.5)
WBC: 9.3 10*3/uL (ref 4.0–10.5)
nRBC: 0 % (ref 0.0–0.2)

## 2018-08-22 LAB — PROTIME-INR
INR: 2.1 — ABNORMAL HIGH (ref 0.8–1.2)
Prothrombin Time: 23 seconds — ABNORMAL HIGH (ref 11.4–15.2)

## 2018-08-22 LAB — POTASSIUM: Potassium: 3.3 mmol/L — ABNORMAL LOW (ref 3.5–5.1)

## 2018-08-22 MED ORDER — POTASSIUM CHLORIDE CRYS ER 20 MEQ PO TBCR
40.0000 meq | EXTENDED_RELEASE_TABLET | ORAL | Status: AC
  Administered 2018-08-22 (×2): 40 meq via ORAL
  Filled 2018-08-22 (×2): qty 2

## 2018-08-22 MED ORDER — FUROSEMIDE 10 MG/ML IJ SOLN
40.0000 mg | Freq: Once | INTRAMUSCULAR | Status: AC
  Administered 2018-08-22: 40 mg via INTRAVENOUS
  Filled 2018-08-22: qty 4

## 2018-08-22 MED ORDER — RIFAXIMIN 550 MG PO TABS
550.0000 mg | ORAL_TABLET | Freq: Two times a day (BID) | ORAL | Status: DC
  Administered 2018-08-22 – 2018-08-25 (×6): 550 mg via ORAL
  Filled 2018-08-22 (×6): qty 1

## 2018-08-22 NOTE — Plan of Care (Signed)
  Problem: Education: Goal: Knowledge of General Education information will improve Description: Including pain rating scale, medication(s)/side effects and non-pharmacologic comfort measures Outcome: Progressing   Problem: Health Behavior/Discharge Planning: Goal: Ability to manage health-related needs will improve Outcome: Progressing   Problem: Clinical Measurements: Goal: Cardiovascular complication will be avoided Outcome: Progressing   Problem: Activity: Goal: Risk for activity intolerance will decrease Outcome: Progressing   Problem: Elimination: Goal: Will not experience complications related to bowel motility Outcome: Progressing   Problem: Pain Managment: Goal: General experience of comfort will improve Outcome: Progressing   Problem: Safety: Goal: Ability to remain free from injury will improve Outcome: Progressing   Problem: Clinical Measurements: Goal: Ability to maintain clinical measurements within normal limits will improve Outcome: Not Met (add Reason) Goal: Will remain free from infection Outcome: Not Met (add Reason) Goal: Diagnostic test results will improve Outcome: Not Met (add Reason)   Problem: Coping: Goal: Level of anxiety will decrease Outcome: Not Met (add Reason)

## 2018-08-22 NOTE — Progress Notes (Addendum)
Daily Rounding Note  08/22/2018, 11:48 AM  LOS: 7 days   SUBJECTIVE:   Chief complaint: Severe alcoholic hepatitis, cirrhosis. Patient wants to go home, even if he leaves AMA.  He says he is not getting any rest.  Nobody is telling him how much longer he is going to need to stay. Patient is eating pretty well, solid food. No abdominal pain today.  No nausea or vomiting.  No BMs yesterday, 1, formed stool today. Urine output 3.5 L yesterday.  OBJECTIVE:         Vital signs in last 24 hours:    Temp:  [97.4 F (36.3 C)-98.3 F (36.8 C)] 97.4 F (36.3 C) (06/24 0851) Pulse Rate:  [64-76] 76 (06/24 0851) Resp:  [16-18] 18 (06/24 0851) BP: (106-117)/(61-67) 116/66 (06/24 0851) SpO2:  [94 %-98 %] 98 % (06/24 0851) Last BM Date: 08/22/18 Filed Weights   08/15/18 1237 08/21/18 0604  Weight: 130.5 kg 132 kg   General: Jaundiced.  Bloated.  Alert.  Comfortable. Heart: RRR. Chest: Clear Abdomen: Bees, nontender.  Active bowel sounds.  Soft Extremities: Marketed 2-3+ pitting lower extremity edema/anasarca. Neuro/Psych: Oriented x3.  No asterixis or tremor.  No gross deficits.  Speech, Joslyn Devonatian still a bit slow  Intake/Output from previous day: 06/23 0701 - 06/24 0700 In: 240 [P.O.:240] Out: 3500 [Urine:3500]  Intake/Output this shift: No intake/output data recorded.  Lab Results: Recent Labs    08/20/18 0343 08/21/18 0409 08/22/18 0353  WBC 6.8 7.3 9.3  HGB 9.1* 9.2* 9.8*  HCT 25.0* 25.1* 26.9*  PLT 109* 107* 105*   BMET Recent Labs    08/20/18 0343 08/21/18 0409 08/22/18 0353  NA 130* 131* 132*  K 3.3* 3.1* 2.8*  CL 95* 94* 93*  CO2 20* 23 25  GLUCOSE 114* 101* 111*  BUN 51* 51* 49*  CREATININE 1.44* 1.30* 1.27*  CALCIUM 9.3 9.2 9.7   LFT Recent Labs    08/20/18 0343 08/21/18 0409 08/22/18 0353  PROT 6.3* 6.6 6.4*  ALBUMIN 2.8* 2.9* 2.8*  AST 139* 136* 148*  ALT 84* 94* 108*  ALKPHOS 126 120  126  BILITOT 40.2* 39.7* 41.5*   PT/INR Recent Labs    08/21/18 0409 08/22/18 0353  LABPROT 22.9* 23.0*  INR 2.1* 2.1*   Hepatitis Panel No results for input(s): HEPBSAG, HCVAB, HEPAIGM, HEPBIGM in the last 72 hours.  Studies/Results: [place] Abdomen Limited  Result Date: 08/21/2018 CLINICAL DATA:  Abdominal distension EXAM: LIMITED ABDOMEN ULTRASOUND FOR ASCITES TECHNIQUE: Limited ultrasound survey for ascites was performed in all four abdominal quadrants. COMPARISON:  CT abdomen and pelvis August 15, 2018; limited abdominal ultrasound August 16, 2018 FINDINGS: Ultrasound of the upper and lower abdomen performed to assess for potential ascites. No ascites evident. There is air-filled bowel throughout the abdomen. Spleen measures 15.3 x 17.2 x 8.2 cm with a measured splenic volume of 1, 131 cubic cm. No splenic lesions evident. IMPRESSION: 1. No demonstrable ascites. Gas-filled bowel throughout abdomen and pelvis. 2.  Splenomegaly. Electronically Signed   By: Bretta BangWilliam  Woodruff III M.D.   On: 08/21/2018 14:14   Scheduled Meds: . folic acid  1 mg Oral Daily  . lactulose  20 g Oral BID  . magic mouthwash  10 mL Oral QID  . midodrine  10 mg Oral TID WC  . multivitamin with minerals  1 tablet Oral Daily  . pantoprazole  40 mg Oral BID  . potassium chloride  40  mEq Oral Q4H  . prednisoLONE  40 mg Oral Daily  . thiamine  100 mg Oral Daily   Or  . thiamine  100 mg Intravenous Daily   Continuous Infusions: . octreotide  (SANDOSTATIN)    IV infusion Stopped (08/18/18 1616)   PRN Meds:.fentaNYL (SUBLIMAZE) injection, ipratropium-albuterol, ondansetron **OR** ondansetron (ZOFRAN) IV  ASSESMENT:   *   Severe alcoholic hepatitis in patient with baseline alcoholic cirrhosis. Total bilirubin once again climbing.  *     Hypokalemia.  *    HRS, AKI.  BUN, creatinine and urine output continue to improve.  Continues on Midodrine  *   S/p banding of esophageal varices 6/18.  Severe portal  hypertensive gastropathy.  Completed octreotide and Rocephin.  On Magic mouthwash for esophageal candidiasis..    *    Normocytic anemia, slowly improving.  Due to blood loss as well as anemia of chronic disease.  *   Coagulopathy.  Stable.    *    Thrombocytopenia.    *   Abdominal distention.  Splenomegaly. Minor to no ascites on repeat ultrasounds.    *  Borderline HE.  On lactulose, not having consistent daily BM's.     PLAN   *    At present the patient plans to leave the hospital even if it is AMA.  He says he can recover at home that he will get better rest there.  His mother who works as a Programmer, applications can help him out. I discussed with the patient, and so did other providers, that his risk of death is high.  It is high even if he stays in the hospital but will be much higher if he goes home.  Also said that should he recover from the alcoholic hepatitis, he still is somebody who is looking at a future liver transplant.  He would not be considered for transplant without some sustained sobriety.  The fact that he is left the hospital AMA will not stand him in good tide as a transplant candidate, this was explained to the patient.  *    Given the rising T bili, prednisolone is not effective after 7 days, so discontinued this.  *    Stop Octreotide?Marland Kitchen  He is well beyond the 72 hours indicated for variceal bleeding but continues on this for the HRS.  Would appreciate renal input as to whether or not it is time to discontinue this.Azucena Freed  08/22/2018, 11:48 AM Phone [phone]    Attending physician's note   I have taken an interval history, reviewed the chart and examined the patient. I agree with the Advanced Practitioner's note, impression and recommendations.   Seen earlier today.  Wanted to leave AMA.  Family convinced him to stay.  Decompensated ETOH liver cirrhosis with portal hypertension with acute on chronic liver failure.  Ascites has resolved, HE is  better. No asterixis. H/O variceal hemorrhage s/p EVL 08/16/2018, coagulopathy with pancytopenia, marked jaundice, hyponatremia.  HRS on midrodrine and octreotide. IV albumin discontinued d/t generalized anasarca /fluid overload.  On IV Lasix. Cr improving.  Appreciate nephrology consult.  TB up to 41.5 today.  Plan: -Continue supportive treatment. -Can stop prednisolone. -Monitor and correct electrolytes. -Continue lactulose.  Add rifaximin. -Overall poor prognosis. -Trend LFTs, PT, electrolytes.  -Although not very reliable, still check ammonia in a.m. -Not a candidate for liver transplant d/t continued alcohol abuse.  Hence, not transferred to tertiary care center.  Carmell Austria, MD [facility] [phone].

## 2018-08-22 NOTE — Progress Notes (Signed)
PROGRESS NOTE  Joseph CarBobby D Valenzuela ZOX:[id number] DOB: 07/02/1975 DOA: 08/15/2018 PCP: Kristian CoveyBurchette, Bruce W, MD  Brief History   Patient is a 43 year old male with hypertension, depression, Barrett's esophagus, alcoholism presented with 2-week history of nausea, vomiting, hematemesis, right upper quadrant abdominal pain.  Patient has been drinking a lot, fifth almost every day.   In ED, patient was noted to be jaundiced, sodium 120, potassium 5.2, creatinine 2.48, hemoglobin 10.8.  Elevated LFTs with total bili of 28.5. GI and nephrology have been consulted. COVID-19 test negative.  The patient has been receiving IV octreotide, IV protonix, and IV albumin as well as lasix, lactulose, and midodrine. He has also received supplemental thiamine and folate. His creatinine has responded nicely so far, although the patient remains significantly swollen in his lower extremities. He has had no further bleeding and his hemoglobin is slowly increasing. Despite these successes the patient remains very ill and at high risk of fulminant liver failure, renal failure and death.  Today the patient is very frustrated and is saying that he will leave AMA if he is not discharged. I have explained to him those things that make his leaving against medical advise dangerous. Namely the fact that his bilirubin is again increasing and that his creatinine, although improved, is only better because he had IV albumin and IV octreotide. He is at very high risk of rebleeding. It is unknown if the patient will leave AMA or not. I have strongly counseled against it.   Consultants  . Nephrology . Gastroenterology  Procedures  . EGD with banding of esophageal varicies  Antibiotics   Anti-infectives (From admission, onward)   Start     Dose/Rate Route Frequency Ordered Stop   08/22/18 2200  rifaximin (XIFAXAN) tablet 550 mg     550 mg Oral 2 times daily 08/22/18 1833     08/17/18 1200  cefTRIAXone (ROCEPHIN) 2 g in sodium chloride  0.9 % 100 mL IVPB     2 g 200 mL/hr over 30 Minutes Intravenous Every 24 hours 08/17/18 1145 08/21/18 1337    .  Marland Kitchen.   Subjective  The patient is agitated and frustrated. He states that he cannot rest here.   Objective   Vitals:  Vitals:   08/22/18 1758 08/22/18 1820  BP: (!) 157/125 (!) 94/48  Pulse: 74 72  Resp: (!) 8   Temp: 97.7 F (36.5 C)   SpO2: 98% 98%    Exam:  Constitutional:  . The patient is awake, alert, and oriented x 3. He is in acute distress due to his desire to leave the hospital. Eyes:  Marland Kitchen. Sclera are icteric. Respiratory:  . No increased work of breathing.  . No wheezes, rales, or rhonchi. . No tactile fremitus. Cardiovascular:  . Regular, rate, and rhythm . No murmurs, ectopy, or gallups . No lateral PMI. No thrills. Abdomen:  . Abdomen soft, non-tender, non-distended . No hernias, masses, or organomegaly . Normoactive bowel  Musculoskeletal:  . Skin is taut. 4+ pitting edema bilaterally Skin:  . Jaundiced. . palpation of skin: no induration or nodules Neurologic:  . CN 2-12 intact . Sensation all 4 extremities intact Psychiatric:  . Mental status o Mood, affect appropriate o Orientation to person, place, time  . judgment and insight appears poor  I have personally reviewed the following:   Today's Data  . Vitals, CBC, CMP  Scheduled Meds: . folic acid  1 mg Oral Daily  . lactulose  20 g Oral BID  .  magic mouthwash  10 mL Oral QID  . midodrine  10 mg Oral TID WC  . multivitamin with minerals  1 tablet Oral Daily  . pantoprazole  40 mg Oral BID  . rifaximin  550 mg Oral BID  . thiamine  100 mg Oral Daily   Or  . thiamine  100 mg Intravenous Daily   Continuous Infusions: . octreotide  (SANDOSTATIN)    IV infusion Stopped (08/18/18 1616)    Active Problems:   GERD   Morbid obesity (HCC)   Anxiety and depression   Abdominal pain   Alcohol abuse   UGIB (upper gastrointestinal bleed)   Hyponatremia   Transaminitis    Ascites   Acute liver failure without hepatic coma   Hematemesis with nausea   Bleeding esophageal varices (HCC)   Alcoholic cirrhosis of liver with ascites (HCC)   Jaundice   Acute post-hemorrhagic anemia   AKI (acute kidney injury) (HCC)   LOS: 7 days    A & P   Acute upper GI bleed, acute blood loss anemia: Patient has a history of GERD, Barrett's esophagus, possible EtOH gastritis or portal hypertensive gastritis.  FOBT positive. Underwent EGD on 6/18 showed, grade 2 varices in the distal esophagus with mild bleeding, 5 bands placed, patchy white plaques in the entire esophagus, severe portal hypertensive gastropathy. GI following, completed IV PPI and octreotide drip for 72 hours.  Continue PPI 40 mg every 12 hours.  H&H stable. Octreotide drip will be restarted per GI and nephrology.  Acute alcoholic hepatitis, transaminitis with hyperbilirubinemia with underlying cirrhosis, jaundice: Patient has been drinking heavily, states fifth of hard liquor almost every day. He may be considered for liver transplantation if he is able to remain sober. CT abdomen and pelvis showed cirrhosis, fatty infiltration of the liver, no focal hepatic lesions or evidence of biliary obstruction, could be superimposed hepatitis.  Evidence of portal venous hypertension with portal venous collateral splenomegaly and ascites.  Distended gallbladder, suggests sludge, several gallstones. Continue IV Rocephin for 5 days for SBP prophylaxis . Continue lactulose. Prednisolone has been discontinued. Total bili continues to rise 41.5, high risk of decompensation and poor prognosis.  Coagulopathy: Secondary to liver cirrhosis: Received vitamin K, FFP, INR 2.1.  Acute kidney injury:   Hepatorenal syndrome, marked volume overload: Creatinine 2.4 at the time of admission, in 01/2018, creatinine was 0.8. 1.27 improving, continue to hold lisinopril, HCTZ.  Continue midodrine, completed octreotide, albumin IV discontinued.  Continue Lasix 40 mg IV x1, 7.0 L positive balance.  Hypokalemia: Monitor and supplement as necessary.  Hyponatremia with lactic acidosis: Sodium 120 at the time of admission with hyperkalemia, patient was placed on IV fluids. Sodium stable at 132. Follow sodium and creatinine with diuresis.  History of alcohol abuse: Heavy drinker, placed on CIWA scale with Ativan, high risk of withdrawals. Continue thiamine, folate, MVI.  Hypotension: BP currently stable, continue midodrine.  I have seen and examined this patient myself. I have spent 45 minutes in his evaluation and care.  Code Status: Full CODE STATUS DVT Prophylaxis:   SCD's Family Communication: Discussed in detail with the patient, all imaging results, lab results explained to the patient.  Patient does not want his family members to be called  Disposition Plan:  If patient does not leave AMA, continue stepdown status with high mortality, acute  alcoholic hepatitis, upper GI bleed, hypotensive, hepatorenal syndrome. Patient requested case management consult for financial help  Ricardo Kayes, DO [facility] Direct contact: see [email or web address]  7PM-7AM contact night coverage as above 08/22/2018, 7:14 PM  LOS: 7 days

## 2018-08-22 NOTE — Progress Notes (Signed)
[facility]    NEPHROLOGY PROGRESS NOTE  SUBJECTIVE:  Pt with 3.5 liters UOP over 6/23 charted.  He has expressed wanting to leave, even if against medical advice.  His legs are swollen and uncomfortable - he'd like to go ahead with a dose of lasix now rather than waiting until AM.   Review of systems:   Edema +  Denies overt shortness of breath  Denies n/v Denies chest pain   OBJECTIVE:  Vitals:   08/22/18 0851 08/22/18 1213  BP: 116/66 (!) 105/59  Pulse: 76 78  Resp: 18 18  Temp: (!) 97.4 F (36.3 C) 97.7 F (36.5 C)  SpO2: 98% 98%    Intake/Output Summary (Last 24 hours) at 08/22/2018 1649 Last data filed at 08/22/2018 1300 Gross per 24 hour  Intake 0 ml  Output 800 ml  Net -800 ml      General: Alert to self, no acute distress  HEENT: MMM [place] AT anicteric sclera  CV:  Heart RRR  Lungs:  L/S CTA bilaterally Abd: Distended, nontender  Extremities: 2-3 bilateral lower extremity edema  skin:  No skin rash, positive jaundice  MEDICATIONS:  . folic acid  1 mg Oral Daily  . lactulose  20 g Oral BID  . magic mouthwash  10 mL Oral QID  . midodrine  10 mg Oral TID WC  . multivitamin with minerals  1 tablet Oral Daily  . pantoprazole  40 mg Oral BID  . potassium chloride  40 mEq Oral Q4H  . thiamine  100 mg Oral Daily   Or  . thiamine  100 mg Intravenous Daily       LABS:   CBC Latest Ref Rng & Units 08/22/2018 08/21/2018 08/20/2018  WBC 4.0 - 10.5 K/uL 9.3 7.3 6.8  Hemoglobin 13.0 - 17.0 g/dL 9.8(L) 9.2(L) 9.1(L)  Hematocrit 39.0 - 52.0 % 26.9(L) 25.1(L) 25.0(L)  Platelets 150 - 400 K/uL 105(L) 107(L) 109(L)    CMP Latest Ref Rng & Units 08/22/2018 08/21/2018 08/20/2018  Glucose 70 - 99 mg/dL 111(H) 101(H) 114(H)  BUN 6 - 20 mg/dL 49(H) 51(H) 51(H)  Creatinine 0.61 - 1.24 mg/dL 1.27(H) 1.30(H) 1.44(H)  Sodium 135 - 145 mmol/L 132(L) 131(L) 130(L)  Potassium 3.5 - 5.1 mmol/L 2.8(L) 3.1(L) 3.3(L)  Chloride 98 - 111 mmol/L 93(L) 94(L) 95(L)  CO2  22 - 32 mmol/L 25 23 20(L)  Calcium 8.9 - 10.3 mg/dL 9.7 9.2 9.3  Total Protein 6.5 - 8.1 g/dL 6.4(L) 6.6 6.3(L)  Total Bilirubin 0.3 - 1.2 mg/dL 41.5(HH) 39.7(HH) 40.2(HH)  Alkaline Phos 38 - 126 U/L 126 120 126  AST 15 - 41 U/L 148(H) 136(H) 139(H)  ALT 0 - 44 U/L 108(H) 94(H) 84(H)    Lab Results  Component Value Date   CALCIUM 9.7 08/22/2018   CAION 1.11 (L) 01/01/2018       Component Value Date/Time   COLORURINE AMBER (A) 08/15/2018 2212   APPEARANCEUR CLOUDY (A) 08/15/2018 2212   LABSPEC 1.017 08/15/2018 2212   PHURINE 5.0 08/15/2018 2212   GLUCOSEU 50 (A) 08/15/2018 2212   HGBUR LARGE (A) 08/15/2018 2212   BILIRUBINUR MODERATE (A) 08/15/2018 2212   KETONESUR 5 (A) 08/15/2018 2212   PROTEINUR 100 (A) 08/15/2018 2212   UROBILINOGEN 0.2 11/29/2009 0506   NITRITE NEGATIVE 08/15/2018 2212   LEUKOCYTESUR NEGATIVE 08/15/2018 2212      Component Value Date/Time   HCO3 29.8 (H) 03/25/2007 1915   TCO2 27 01/01/2018 1422  Component Value Date/Time   IRON 119 07/05/2011 1144   FERRITIN 39.7 07/05/2011 1144   IRONPCTSAT 22.6 07/05/2011 1144       ASSESSMENT/PLAN:    43 year old male patient with a past medical history significant for hypertension, depression, Barrett's esophagus, and heavy alcohol use who presented with a two-week history of nausea, vomiting, hematemesis, and right upper quadrant pain.  He was noted to have markedly elevated LFTs and a total bili of 28.5.    1.  Alcoholic hepatitis.  On pred per primary   2.  Acute kidney injury.  Likely secondary to hepatorenal syndrome with markedly low urine sodium.  Baseline serum creatinine 0.8 from 02/26/2018 - Would continue octreotide and Midodrine.  (Octreotide as infusion or subcutaneous injection 100 to 200 mcg three times daily).  See that infusion is ordered.   - Had temporarily discontinued scheduled lasix to allow to refill intravascularly to avoid worsening AKI. Will redose lasix once   3. Hypokalemia  - repleted earlier today per primary.  Mag is replete.  Recheck K   4.  Acute liver failure.  Overall poor prognosis.  Bilirubin increasing last assessment  5.  Anemia - stable.  No acute indication for PRBC's.

## 2018-08-22 NOTE — Progress Notes (Signed)
Pt stated he's urinated 5x since administration of lasix at 1800. Pt did not use urinal so unable to identify exactly how much patient diuresed thus far. This RN educated patient to notify RN so measurement can be obtained and recorded.

## 2018-08-22 NOTE — Progress Notes (Signed)
Patient is feeling very frustrated this morning. States he can not get any rest here at the hospital and needs to go home. States his mother can take care of him at home because this place is driving him crazy. Will leave AMA possibly today. Waiting to see Dr. Benny Lennert. Joseph Loud MSN RN CMSRN

## 2018-08-23 DIAGNOSIS — Z515 Encounter for palliative care: Secondary | ICD-10-CM

## 2018-08-23 DIAGNOSIS — F329 Major depressive disorder, single episode, unspecified: Secondary | ICD-10-CM

## 2018-08-23 DIAGNOSIS — Z7189 Other specified counseling: Secondary | ICD-10-CM

## 2018-08-23 DIAGNOSIS — F419 Anxiety disorder, unspecified: Secondary | ICD-10-CM

## 2018-08-23 LAB — CBC
HCT: 26.8 % — ABNORMAL LOW (ref 39.0–52.0)
Hemoglobin: 9.8 g/dL — ABNORMAL LOW (ref 13.0–17.0)
MCH: 32.8 pg (ref 26.0–34.0)
MCHC: 36.6 g/dL — ABNORMAL HIGH (ref 30.0–36.0)
MCV: 89.6 fL (ref 80.0–100.0)
Platelets: UNDETERMINED 10*3/uL (ref 150–400)
RBC: 2.99 MIL/uL — ABNORMAL LOW (ref 4.22–5.81)
RDW: 20 % — ABNORMAL HIGH (ref 11.5–15.5)
WBC: 11.2 10*3/uL — ABNORMAL HIGH (ref 4.0–10.5)
nRBC: 0 % (ref 0.0–0.2)

## 2018-08-23 LAB — COMPREHENSIVE METABOLIC PANEL
ALT: 116 U/L — ABNORMAL HIGH (ref 0–44)
AST: 147 U/L — ABNORMAL HIGH (ref 15–41)
Albumin: 2.6 g/dL — ABNORMAL LOW (ref 3.5–5.0)
Alkaline Phosphatase: 125 U/L (ref 38–126)
Anion gap: 14 (ref 5–15)
BUN: 46 mg/dL — ABNORMAL HIGH (ref 6–20)
CO2: 24 mmol/L (ref 22–32)
Calcium: 9.5 mg/dL (ref 8.9–10.3)
Chloride: 95 mmol/L — ABNORMAL LOW (ref 98–111)
Creatinine, Ser: 1.27 mg/dL — ABNORMAL HIGH (ref 0.61–1.24)
GFR calc Af Amer: >60 mL/min (ref >60)
GFR calc non Af Amer: >60 mL/min (ref >60)
Glucose, Bld: 100 mg/dL — ABNORMAL HIGH (ref 70–99)
Potassium: 3.1 mmol/L — ABNORMAL LOW (ref 3.5–5.1)
Sodium: 133 mmol/L — ABNORMAL LOW (ref 135–145)
Total Bilirubin: 41.1 mg/dL (ref 0.3–1.2)
Total Protein: 6.4 g/dL — ABNORMAL LOW (ref 6.5–8.1)

## 2018-08-23 LAB — PROTIME-INR
INR: 2.1 — ABNORMAL HIGH (ref 0.8–1.2)
Prothrombin Time: 23.6 seconds — ABNORMAL HIGH (ref 11.4–15.2)

## 2018-08-23 LAB — AMMONIA: Ammonia: 33 umol/L (ref 9–35)

## 2018-08-23 MED ORDER — ALPRAZOLAM 0.25 MG PO TABS
0.2500 mg | ORAL_TABLET | Freq: Two times a day (BID) | ORAL | Status: DC | PRN
  Filled 2018-08-23: qty 1

## 2018-08-23 MED ORDER — POTASSIUM CHLORIDE CRYS ER 20 MEQ PO TBCR
30.0000 meq | EXTENDED_RELEASE_TABLET | Freq: Once | ORAL | Status: AC
  Administered 2018-08-23: 30 meq via ORAL

## 2018-08-23 MED ORDER — PANTOPRAZOLE SODIUM 40 MG PO TBEC
40.0000 mg | DELAYED_RELEASE_TABLET | Freq: Every day | ORAL | Status: DC
  Administered 2018-08-23 – 2018-08-25 (×3): 40 mg via ORAL
  Filled 2018-08-23 (×3): qty 1

## 2018-08-23 MED ORDER — FUROSEMIDE 10 MG/ML IJ SOLN
40.0000 mg | Freq: Once | INTRAMUSCULAR | Status: AC
  Administered 2018-08-23: 40 mg via INTRAVENOUS
  Filled 2018-08-23: qty 4

## 2018-08-23 MED ORDER — OCTREOTIDE ACETATE 100 MCG/ML IJ SOLN
100.0000 ug | Freq: Three times a day (TID) | INTRAMUSCULAR | Status: DC
  Administered 2018-08-23 – 2018-08-25 (×4): 100 ug via SUBCUTANEOUS
  Filled 2018-08-23 (×8): qty 1

## 2018-08-23 MED ORDER — POTASSIUM CHLORIDE CRYS ER 20 MEQ PO TBCR
40.0000 meq | EXTENDED_RELEASE_TABLET | ORAL | Status: AC
  Administered 2018-08-23 (×2): 40 meq via ORAL
  Filled 2018-08-23 (×2): qty 2

## 2018-08-23 MED ORDER — ALBUMIN HUMAN 25 % IV SOLN
25.0000 g | Freq: Once | INTRAVENOUS | Status: AC
  Administered 2018-08-23: 25 g via INTRAVENOUS
  Filled 2018-08-23: qty 50

## 2018-08-23 NOTE — Progress Notes (Addendum)
Daily Rounding Note  08/23/2018, 9:00 AM  LOS: 8 days   SUBJECTIVE:   Chief complaint:  Alcoholic hepatitis, cirrhosis    Pt decided to not leave AMA.   Burning discomfort and swellling persists in legs, feet.   3 or 4 BM's overnight, brown.  No nausea Tending to hypotension.   Urine output not reliable as pt not using urinal consistently. Urinated frequently yesterday, esp after lasix.     OBJECTIVE:         Vital signs in last 24 hours:    Temp:  [97.7 F (36.5 C)-98 F (36.7 C)] 97.8 F (36.6 C) (06/25 0400) Pulse Rate:  [64-78] 72 (06/25 0400) Resp:  [8-18] 8 (06/24 1758) BP: (94-157)/(48-125) 97/61 (06/25 0400) SpO2:  [96 %-98 %] 98 % (06/25 0400) Last BM Date: 08/23/18 Filed Weights   08/15/18 1237 08/21/18 0604  Weight: 130.5 kg 132 kg   General: jaundiced, looks unwell, no toxic.  comfortable   Heart: RRR Chest: clear bil Abdomen: obese, diffuse, mild discomfort across upper belly.  Active BS  Extremities: edema in LE persists Neuro/Psych:  Oriented x 3.  No asterixis.  Slow mentation and speech.    Intake/Output from previous day: No intake/output data recorded.  Intake/Output this shift: No intake/output data recorded.  Lab Results: Recent Labs    08/21/18 0409 08/22/18 0353 08/23/18 0631  WBC 7.3 9.3 11.2*  HGB 9.2* 9.8* 9.8*  HCT 25.1* 26.9* 26.8*  PLT 107* 105* PLATELET CLUMPS NOTED ON SMEAR, UNABLE TO ESTIMATE   BMET Recent Labs    08/21/18 0409 08/22/18 0353 08/22/18 1915 08/23/18 0631  NA 131* 132*  --  133*  K 3.1* 2.8* 3.3* 3.1*  CL 94* 93*  --  95*  CO2 23 25  --  24  GLUCOSE 101* 111*  --  100*  BUN 51* 49*  --  46*  CREATININE 1.30* 1.27*  --  1.27*  CALCIUM 9.2 9.7  --  9.5   LFT Recent Labs    08/21/18 0409 08/22/18 0353 08/23/18 0631  PROT 6.6 6.4* 6.4*  ALBUMIN 2.9* 2.8* 2.6*  AST 136* 148* 147*  ALT 94* 108* 116*  ALKPHOS 120 126 125  BILITOT 39.7*  41.5* 41.1*   PT/INR Recent Labs    08/22/18 0353 08/23/18 0631  LABPROT 23.0* 23.6*  INR 2.1* 2.1*   Hepatitis Panel No results for input(s): HEPBSAG, HCVAB, HEPAIGM, HEPBIGM in the last 72 hours.  Studies/Results: [place] Abdomen Limited  Result Date: 08/21/2018 CLINICAL DATA:  Abdominal distension EXAM: LIMITED ABDOMEN ULTRASOUND FOR ASCITES TECHNIQUE: Limited ultrasound survey for ascites was performed in all four abdominal quadrants. COMPARISON:  CT abdomen and pelvis August 15, 2018; limited abdominal ultrasound August 16, 2018 FINDINGS: Ultrasound of the upper and lower abdomen performed to assess for potential ascites. No ascites evident. There is air-filled bowel throughout the abdomen. Spleen measures 15.3 x 17.2 x 8.2 cm with a measured splenic volume of 1, 131 cubic cm. No splenic lesions evident. IMPRESSION: 1. No demonstrable ascites. Gas-filled bowel throughout abdomen and pelvis. 2.  Splenomegaly. Electronically Signed   By: Bretta BangWilliam  Woodruff III M.D.   On: 08/21/2018 14:14   Scheduled Meds: . folic acid  1 mg Oral Daily  . lactulose  20 g Oral BID  . magic mouthwash  10 mL Oral QID  . midodrine  10 mg Oral TID WC  . multivitamin with minerals  1 tablet Oral  Daily  . pantoprazole  40 mg Oral BID  . rifaximin  550 mg Oral BID  . thiamine  100 mg Oral Daily   Or  . thiamine  100 mg Intravenous Daily   Continuous Infusions: . octreotide  (SANDOSTATIN)    IV infusion Stopped (08/18/18 1616)   PRN Meds:.fentaNYL (SUBLIMAZE) injection, ipratropium-albuterol, ondansetron **OR** ondansetron (ZOFRAN) IV   ASSESMENT:   *   Severe alcoholic hepatitis in patient with baseline alcoholic cirrhosis. No improvement with prednisolone after 1 week, discontinued on 6/24.    *     Hypokalemia. Persists.    *   Hyponatremia.  Improving.    *    HRS, AKI.  improved.  Continues on Midodrine.  Albumin and sandostatin discontinued.    *   S/p banding of esophageal varices 6/18.  Severe  portal hypertensive gastropathy.  Completed octreotide and Rocephin.  On Magic mouthwash for esophageal candidiasis.   *    Normocytic anemia, stable.  Due to blood loss as well as anemia of chronic disease.  *  New leukocytosis.  No ascites (thus noSBP) on ultrasounds 6/18 and 6/23  *   Coagulopathy.  Stable.    *    Thrombocytopenia.  Splenomegaly.      *  Borderline HE.  On lactulose, Rifaximin added 6/24.     PLAN   *  switched Octreotide to 100 mcg sq as per Dr Luis Abed outline yesterday.  *  Switch to Protonix 40 mg/day.    *   Continue current measures.        Azucena Freed  08/23/2018, 9:00 AM Phone [phone]    Attending physician's note   I have taken an interval history, reviewed the chart and examined the patient. I agree with the Advanced Practitioner's note, impression and recommendations.   Much alert today.  Discussed care with nursing staff.  [place] livercirrhosis withportal hypertension with acute on chronic liver failure.  Ascites has resolved.  HE-well-controlled on lactulose and rifaximin has been added. No asterixis.  Although not an ideal marker but ammonia is back to normal.    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 Off prednisolone.  HRS on midrodrine and octreotide. IValbumin discontinuedd/tgeneralized anasarca/fluid overload.On IV Lasix. Crimproving.   Nephrology following.   Plan: -Continue supportive treatment. -Continue current treatment. -Overall poor prognosis. -Trend LFTs, PT,electrolytes.  -Appreciate palliative care consultation.  Carmell Austria, MD [facility] [phone].

## 2018-08-23 NOTE — Progress Notes (Addendum)
I witnessed pt's desire to be DNR/DNI with Nurse Practitioner, Ihor Dow. We discussed visitors as pt shared that it is his mother's birthday tomorrow. Communicated with charge RN about visitors.

## 2018-08-23 NOTE — Consult Note (Addendum)
Consultation Note Date: 08/23/2018   Patient Name: Joseph Valenzuela  DOB: [birth year]  MRN: [id number]  Age / Sex: 43 y.o., male  PCP: Eulas Post, MD Referring Physician: Karie Kirks, DO  Reason for Consultation: Establishing goals of care  HPI/Patient Profile: 43 y.o. male  with past medical history of depression, anxiety, ETOH abuse, HTN, Barrett's esophagus, hiatal hernia, IBS, fatty liver, admitted on 08/15/2018 with 2-week history of nausea, vomiting, hematemesis, and right upper quadrant abdominal pain. Actively alcohol abuse, reports fifth almost every day. In Joseph Valenzuela, patient jaundiced with creatinine of 2.48, elevated LFT's, and total bili of 28.5 (now 41.5). EGD 6/18 s/p grade 2 varices in distal esophagus with mild bleeding, 5 bands placed. Severe portal hypertensive gastropathy. Also with hepatorenal syndrome. GI and nephrology following. Very high risk for decompensation with poor prognosis. Not a candidate for liver transplant with recent alcohol use. Palliative medicine consultation for goals of care.    Clinical Assessment and Goals of Care:  I have reviewed medical records, discussed with care team, and met with Joseph Valenzuela at bedside to discuss diagnosis, prognosis, GOC, EOL wishes, disposition and options. Joseph Valenzuela is awake, alert, oriented and able to participate in [name] discussion. C/o of back and leg pain, recently given IV fentanyl that does provide relief of pain.  Introduced Palliative Medicine as specialized medical care for people living with serious illness. It focuses on providing relief from the symptoms and stress of a serious illness. The goal is to improve quality of life for both the patient and the family.  We discussed a brief life review of the patient. Joseph Valenzuela shares his unfortunate battle with depression since the age of 62 with subsequent alcohol use as his coping mechanism. He has lost  many close relatives and friends throughout his life. His wife and love of his life for 22 years, demanded he move out last June. He admits to worsening alcohol use since this past September. PTA, living alone in his brother's home. He has been a "functioning alcoholic," working daily delivering [place] and drinking nightly when home. In the past, he loved to travel and "music is my life."   Admits to history of suicidal ideation and that his brothers took away his guns. He denies current suicidal ideation.   Discussed events leading up to admission and course of hospitalization including diagnoses, interventions, and poor prognosis. Joseph Valenzuela shares his eye-opening experience yesterday when all of his providers explained how sick he was with very poor prognosis, especially if he chose to leave AMA. After talking with his mother last night, he made the decision to stay.   Discussed sobriety and compliance with medical management giving him the best chance for more time. We did discuss high risk for decompensation at any time. Also the fact that he is not a current candidate for liver transplant with recent alcohol use.   I attempted to elicit values and goals of care important to the patient. Joseph Valenzuela speaks of the importance of getting his affairs in order and  plans to contact the family lawyer to complete financial paperwork. We discussed AD packet and the importance of documenting HCPOA. Joseph Valenzuela would want his mother as primary POA and brother Joseph Valenzuela) as secondary POA.   Advanced directives, concepts specific to code status, artifical feeding and hydration, and rehospitalization were considered and discussed. Introduced and discussed MOST form. Explored his thoughts on heroic measures if EOL was near. Joseph Valenzuela adamantly shares he does not want to be resuscitated and would not wish to be on a life support machine. Explained medical recommendation against heroic measures at EOL with underlying irreversible liver  disease, current clinical condition, and poor chance of meaningful quality of life if he survived resuscitative efforts. He requests a DNR code status.   This NP called nurse, Joseph Valenzuela, to bedside to witness patient's decision for code status change to DO NOT RESUSCITATE. Reassured Joseph Valenzuela that this decision does not change current plan of care and medical management, just means that the care team will not perform heroic measures at EOL if his heart stopped and he died. Patient confirms decision for DNR and his understanding of this. Encouraged patient to discuss his decision for DNR with his mother, when he is ready.   Discussed MOST form further but [name] requests I follow-up with him tomorrow regarding MOST form. He is tired and plans to take a nap after our conversation.  [name] is most concerned about speaking to social work about applying for disability. Consult placed.   In conclusion, we discussed 'hoping for the best but also preparing for the worst.' [name] speaks of his hope to stay "positive" and compliant with sobriety and medical management. He further explains "if it gets to the point where I'm not going to make it, keep me as comfortable as possible."   Questions and concerns were addressed. Therapeutic listening and emotional/spiritual support provided.     SUMMARY OF RECOMMENDATIONS    Good initial palliative discussion with patient.  After conversations with care team on 6/24, patient understands diagnoses and poor prognosis. He speaks of getting his affairs in order including financial affairs as well as living will/HCPOA paperwork.   Patient wishes for code status change to DNR. RN witnessed this decision at bedside.   Patient requests PMT NP f/u tomorrow regarding completion of MOST form and AD packet.  SW consult to assist with AA resources and assistance with insurance/disability.  Patient hopes to stay "positive" and compliant with sobriety and medical  management. He does acknowledge wishes against heroic measures at EOL and wish for comfort at EOL.   PMT will follow. Will benefit from outpatient palliative referral if not hospice referral pending clinical course and patient goals.   Code Status/Advance Care Planning:  DNR  Symptom Management:   Fentanyl 25-43mg IV q2h prn severe pain  Xanax 0.263mPO BID prn anxiety  Palliative Prophylaxis:   Bowel Regimen, Delirium Protocol and Frequent Pain Assessment  Psycho-social/Spiritual:   Desire for further Chaplaincy support: yes  Additional Recommendations: Caregiving  Support/Resources, Compassionate Wean Education and Education on Hospice  Prognosis:   Unable to determine: guarded   Discharge Planning: To Be Determined      Primary Diagnoses: Present on Admission: . Abdominal pain . Alcohol abuse . Anxiety and depression . GERD . Morbid obesity ([facility]. Hyponatremia . Transaminitis   I have reviewed the medical record, interviewed the patient and family, and examined the patient. The following aspects are pertinent.  Past Medical History:  Diagnosis Date  . Alcoholism ([place]  .  Anxiety   . Asthma   . Back pain, chronic   . Barrett esophagus   . Benign positional vertigo   . Candidiasis of mouth   . Degenerative spinal arthritis   . Depression with anxiety   . Esophageal reflux   . Fatty liver   . Herniated disc   . Hiatal hernia   . Hypertension   . Irritable bowel syndrome   . Obesity   . Spleen enlarged   . Suicidal ideations   . Vitamin B12 deficiency    Social History   Socioeconomic History  . Marital status: Married    Spouse name: Not on file  . Number of children: 0  . Years of education: Not on file  . Highest education level: Not on file  Occupational History  . Occupation: IT    CommentAudiological scientist novelties  Social Needs  . Financial resource strain: Not on file  . Food insecurity    Worry: Not on file    Inability: Not on file   . Transportation needs    Medical: Not on file    Non-medical: Not on file  Tobacco Use  . Smoking status: Never Smoker  . Smokeless tobacco: Never Used  Substance and Sexual Activity  . Alcohol use: Yes    Alcohol/week: 0.0 standard drinks    Comment: drink twice a week per pt.  . Drug use: No    Types: Marijuana    Comment: previous hx  . Sexual activity: Not on file  Lifestyle  . Physical activity    Days per week: Not on file    Minutes per session: Not on file  . Stress: Not on file  Relationships  . Social Herbalist on phone: Not on file    Gets together: Not on file    Attends religious service: Not on file    Active member of club or organization: Not on file    Attends meetings of clubs or organizations: Not on file    Relationship status: Not on file  Other Topics Concern  . Not on file  Social History Narrative   Married no children, 2 dogs lives in apartment   Part-time forklift driver and part-time Nurse, learning disability.   Family History  Problem Relation Age of Onset  . Alcohol abuse Father   . Depression Mother   . Diabetes Mother   . Hypertension Brother   . Diabetes Maternal Aunt   . Stomach cancer Maternal Grandmother        great  . Brain cancer Maternal Grandfather   . Colon cancer Neg Hx   . Esophageal cancer Neg Hx    Scheduled Meds: . folic acid  1 mg Oral Daily  . lactulose  20 g Oral BID  . magic mouthwash  10 mL Oral QID  . midodrine  10 mg Oral TID WC  . multivitamin with minerals  1 tablet Oral Daily  . octreotide  100 mcg Subcutaneous Q8H  . pantoprazole  40 mg Oral Daily  . potassium chloride  40 mEq Oral Q4H  . rifaximin  550 mg Oral BID  . thiamine  100 mg Oral Daily   Continuous Infusions: PRN Meds:.fentaNYL (SUBLIMAZE) injection, ipratropium-albuterol, ondansetron **OR** ondansetron (ZOFRAN) IV Medications Prior to Admission:  Prior to Admission medications   Medication Sig Start Date End Date Taking? Authorizing  Provider  clonazePAM (KLONOPIN) 1 MG tablet Take one by mouth bid Patient taking differently: Take 1 mg by  mouth 2 (two) times daily. Take one by mouth bid 05/30/18  Yes Burchette, Alinda Sierras, MD  dexlansoprazole (DEXILANT) 60 MG capsule Take 60 mg by mouth daily.   Yes [provider]  lisinopril-hydrochlorothiazide (PRINZIDE,ZESTORETIC) 20-12.5 MG tablet Take 1 tablet by mouth daily.   Yes [provider]   No Known Allergies Review of Systems  Gastrointestinal: Positive for nausea and vomiting.  Neurological: Positive for weakness.   Physical Exam Vitals signs and nursing note reviewed.  Constitutional:      General: He is awake.     Appearance: He is ill-appearing.  HENT:     Head: Normocephalic and atraumatic.  Cardiovascular:     Rate and Rhythm: Normal rate.  Pulmonary:     Effort: No tachypnea, accessory muscle usage or respiratory distress.  Abdominal:     Tenderness: There is no abdominal tenderness.  Skin:    Coloration: Skin is jaundiced.  Neurological:     Mental Status: He is alert and oriented to person, place, and time.  Psychiatric:        Attention and Perception: Attention normal.        Mood and Affect: Affect is flat.        Speech: Speech normal.        Cognition and Memory: Cognition normal.    Vital Signs: BP 98/62 (BP Location: Right Arm)   Pulse 77   Temp 97.8 F (36.6 C) (Oral)   Resp 18   Ht _0  (1.753 m)   Wt 132 kg   SpO2 100%   BMI 42.97 kg/m  Pain Scale: 0-10 POSS *See Group Information*: 1-Acceptable,Awake and alert Pain Score: 5    SpO2: SpO2: 100 % O2 Device:SpO2: 100 % O2 Flow Rate: .   IO: Intake/output summary: No intake or output data in the 24 hours ending 08/23/18 1441  LBM: Last BM Date: 08/23/18 Baseline Weight: Weight: 130.5 kg Most recent weight: Weight: 132 kg     Palliative Assessment/Data: PPS 60%   Flowsheet Rows     Most Recent Value  Intake Tab  Referral Department  Hospitalist  Unit  at Time of Referral  Med/Surg Unit  Palliative Care Primary Diagnosis  Other (Comment) [liver cirrhosis, alcoholic hepatitis, cardiorenal syndrome]  Palliative Care Type  New Palliative care  Reason for referral  Clarify Goals of Care  Date first seen by Palliative Care  08/23/18  Clinical Assessment  Palliative Performance Scale Score  60%  Psychosocial & Spiritual Assessment  Palliative Care Outcomes  Patient/Family meeting held?  Yes  Who was at the meeting?  patient  Palliative Care Outcomes  Clarified goals of care, Provided end of life care assistance, Provided psychosocial or spiritual support, Changed CPR status, ACP counseling assistance       Time In: 1130  Time Out: 1300 Time Total: 4mn Greater than 50%  of this time was spent counseling and coordinating care related to the above assessment and plan.  Signed by:  MIhor Dow DNP, FNP-C Palliative Medicine Team  Phone: [phone]: [phone]  Please contact Palliative Medicine Team phone at [phone] questions and concerns.  For individual provider: See AShea Evans

## 2018-08-23 NOTE — Progress Notes (Signed)
[facility]    NEPHROLOGY PROGRESS NOTE  SUBJECTIVE:   Seen by palliative care and he is now DNR.  Strict ins/outs are not available.  He has been off of octreotide and said it was easier being off the continuous IV.  He was transitioned to [facility] octreotide per GI.   Review of systems:  Edema +  Some shortness of breath  Denies n/v  Good urine output yesterday but less today.    OBJECTIVE:  Vitals:   08/23/18 0953 08/23/18 1532  BP: 98/62 (!) 111/57  Pulse: 77 80  Resp: 18 15  Temp: 97.8 F (36.6 C) 97.6 F (36.4 C)  SpO2: 100% 97%    Intake/Output Summary (Last 24 hours) at 08/23/2018 1622 Last data filed at 08/23/2018 1613 Gross per 24 hour  Intake 240 ml  Output -  Net 240 ml      General: Alert to self, no acute distress   HEENT: MMM  AT anicteric sclera  CV:  Heart RRR  Lungs:  L/S CTA bilaterally Abd: Distended, nontender  Extremities: 2-3 bilateral lower extremity edema  skin:  No skin rash, positive jaundice  MEDICATIONS:  . folic acid  1 mg Oral Daily  . lactulose  20 g Oral BID  . magic mouthwash  10 mL Oral QID  . midodrine  10 mg Oral TID WC  . multivitamin with minerals  1 tablet Oral Daily  . octreotide  100 mcg Subcutaneous Q8H  . pantoprazole  40 mg Oral Daily  . rifaximin  550 mg Oral BID  . thiamine  100 mg Oral Daily       LABS:   CBC Latest Ref Rng & Units 08/23/2018 08/22/2018 08/21/2018  WBC 4.0 - 10.5 K/uL 11.2(H) 9.3 7.3  Hemoglobin 13.0 - 17.0 g/dL 8.2(N9.8(L) 5.6(O9.8(L) 1.3(Y9.2(L)  Hematocrit 39.0 - 52.0 % 26.8(L) 26.9(L) 25.1(L)  Platelets 150 - 400 K/uL PLATELET CLUMPS NOTED ON SMEAR, UNABLE TO ESTIMATE 105(L) 107(L)    CMP Latest Ref Rng & Units 08/23/2018 08/22/2018 08/22/2018  Glucose 70 - 99 mg/dL 865(H100(H) - 846(N111(H)  BUN 6 - 20 mg/dL 62(X46(H) - 52(W49(H)  Creatinine 0.61 - 1.24 mg/dL 4.13(K1.27(H) - 4.40(N1.27(H)  Sodium 135 - 145 mmol/L 133(L) - 132(L)  Potassium 3.5 - 5.1 mmol/L 3.1(L) 3.3(L) 2.8(L)  Chloride 98 - 111 mmol/L 95(L) - 93(L)   CO2 22 - 32 mmol/L 24 - 25  Calcium 8.9 - 10.3 mg/dL 9.5 - 9.7  Total Protein 6.5 - 8.1 g/dL 6.4(L) - 6.4(L)  Total Bilirubin 0.3 - 1.2 mg/dL 41.1(HH) - 41.5(HH)  Alkaline Phos 38 - 126 U/L 125 - 126  AST 15 - 41 U/L 147(H) - 148(H)  ALT 0 - 44 U/L 116(H) - 108(H)    Lab Results  Component Value Date   CALCIUM 9.5 08/23/2018   CAION 1.11 (L) 01/01/2018       Component Value Date/Time   COLORURINE AMBER (A) 08/15/2018 2212   APPEARANCEUR CLOUDY (A) 08/15/2018 2212   LABSPEC 1.017 08/15/2018 2212   PHURINE 5.0 08/15/2018 2212   GLUCOSEU 50 (A) 08/15/2018 2212   HGBUR LARGE (A) 08/15/2018 2212   BILIRUBINUR MODERATE (A) 08/15/2018 2212   KETONESUR 5 (A) 08/15/2018 2212   PROTEINUR 100 (A) 08/15/2018 2212   UROBILINOGEN 0.2 11/29/2009 0506   NITRITE NEGATIVE 08/15/2018 2212   LEUKOCYTESUR NEGATIVE 08/15/2018 2212      Component Value Date/Time   HCO3 29.8 (H) 03/25/2007 1915   TCO2 27 01/01/2018 1422  Component Value Date/Time   IRON 119 07/05/2011 1144   FERRITIN 39.7 07/05/2011 1144   IRONPCTSAT 22.6 07/05/2011 1144       ASSESSMENT/PLAN:    43 year old male patient with a past medical history significant for hypertension, depression, Barrett's esophagus, and heavy alcohol use who presented with a two-week history of nausea, vomiting, hematemesis, and right upper quadrant pain.  He was noted to have markedly elevated LFTs and a total bili of 28.5.    1.  Alcoholic hepatitis.  On pred per primary   2.  Acute kidney injury.  Secondary to hepatorenal syndrome with markedly low urine sodium.  Baseline serum creatinine 0.8 from 02/26/2018 - Would resume octreotide and continue Midodrine  - lasix/albumin once now   3. Hypokalemia - repleted earlier today per primary.  Keep mag replete   4.  Acute liver failure.  Overall poor prognosis.  Bilirubin increasing and appreciate palliative care   5.  Anemia - stable.  No acute indication for PRBC's.

## 2018-08-23 NOTE — Progress Notes (Signed)
PROGRESS NOTE  Joseph Valenzuela ZOX:[id number] DOB: 09/05/1975 DOA: 08/15/2018 PCP: Kristian CoveyBurchette, Bruce W, MD  Brief History   Patient is a 43 year old male with hypertension, depression, Barrett's esophagus, alcoholism presented with 2-week history of nausea, vomiting, hematemesis, right upper quadrant abdominal pain.  Patient has been drinking a lot, fifth almost every day.   In ED, patient was noted to be jaundiced, sodium 120, potassium 5.2, creatinine 2.48, hemoglobin 10.8.  Elevated LFTs with total bili of 28.5. GI and nephrology have been consulted. COVID-19 test negative.  The patient has been receiving IV octreotide, IV protonix, and IV albumin as well as lasix, lactulose, and midodrine. He has also received supplemental thiamine and folate. His creatinine has responded nicely so far, although the patient remains significantly swollen in his lower extremities. He has had no further bleeding and his hemoglobin is slowly increasing. Despite these successes the patient remains very ill and at high risk of fulminant liver failure, renal failure and death.  Yesterday the patient was very frustrated and was saying that he will leave AMA, if he is not discharged. I explained to him those things that make his leaving against medical advise dangerous. Namely the fact that his bilirubin is again increasing and that his creatinine, although improved, is only better because he had IV albumin and IV octreotide. He is at very high risk of rebleeding. Ultimately after speaking with me as well as GI and nephrology and speaking with his mother, the patient has decided to stay to complete treatment. He has also committed to abstaining from alcohol and aspiring to be placed on a liver transplant list.  Consultants  . Nephrology . Gastroenterology  Procedures  . EGD with banding of esophageal varicies  Antibiotics   Anti-infectives (From admission, onward)   Start     Dose/Rate Route Frequency Ordered Stop    08/22/18 2200  rifaximin (XIFAXAN) tablet 550 mg     550 mg Oral 2 times daily 08/22/18 1833     08/17/18 1200  cefTRIAXone (ROCEPHIN) 2 g in sodium chloride 0.9 % 100 mL IVPB     2 g 200 mL/hr over 30 Minutes Intravenous Every 24 hours 08/17/18 1145 08/21/18 1337     .   Subjective  The patient is awake, alert, and oriented x 3. He states that he got better rest last night with fewer night time awakenings.   Objective   Vitals:  Vitals:   08/23/18 0953 08/23/18 1532  BP: 98/62 (!) 111/57  Pulse: 77 80  Resp: 18 15  Temp: 97.8 F (36.6 C) 97.6 F (36.4 C)  SpO2: 100% 97%    Exam:  Constitutional:  The patient is awake, alert, and oriented x 3. No acute distress Eyes:  Marland Kitchen. Sclera are icteric. Respiratory:  . No increased work of breathing.  . No wheezes, rales, or rhonchi. . No tactile fremitus. Cardiovascular:  . Regular, rate, and rhythm . No murmurs, ectopy, or gallups . No lateral PMI. No thrills. Abdomen:  . Abdomen soft, non-tender, non-distended . No hernias, masses, or organomegaly . Normoactive bowel  Musculoskeletal:  . Skin is taut. 4+ pitting edema bilaterally Skin:  . Jaundiced. . palpation of skin: no induration or nodules Neurologic:  . CN 2-12 intact . Sensation all 4 extremities intact Psychiatric:  . Mental status o Mood, affect appropriate o Orientation to person, place, time  . judgment and insight appears poor  I have personally reviewed the following:   Today's Data  . Vitals,  CBC, CMP  Scheduled Meds: . folic acid  1 mg Oral Daily  . lactulose  20 g Oral BID  . magic mouthwash  10 mL Oral QID  . midodrine  10 mg Oral TID WC  . multivitamin with minerals  1 tablet Oral Daily  . octreotide  100 mcg Subcutaneous Q8H  . pantoprazole  40 mg Oral Daily  . rifaximin  550 mg Oral BID  . thiamine  100 mg Oral Daily   Continuous Infusions:   Active Problems:   GERD   Morbid obesity (HCC)   Anxiety and depression   Abdominal  pain   Alcohol abuse   UGIB (upper gastrointestinal bleed)   Hyponatremia   Transaminitis   Ascites   Acute liver failure without hepatic coma   Hematemesis with nausea   Bleeding esophageal varices (HCC)   Alcoholic cirrhosis of liver with ascites (HCC)   Jaundice   Acute post-hemorrhagic anemia   AKI (acute kidney injury) ([place])   Palliative care by specialist   Goals of care, counseling/discussion   LOS: 8 days    A & P   Acute upper GI bleed, acute blood loss anemia: Patient has a history of GERD, Barrett's esophagus, possible EtOH gastritis or portal hypertensive gastritis.  FOBT positive. Underwent EGD on 6/18 showed, grade 2 varices in the distal esophagus with mild bleeding, 5 bands placed, patchy white plaques in the entire esophagus, severe portal hypertensive gastropathy. GI following, completed IV PPI and octreotide drip for 72 hours.  Continue PPI 40 mg every 12 hours.  H&H stable. Octreotide drip will be restarted per GI and nephrology.  Acute alcoholic hepatitis, transaminitis with hyperbilirubinemia with underlying cirrhosis, jaundice: Patient has been drinking heavily, states fifth of hard liquor almost every day. He may be considered for liver transplantation if he is able to remain sober. CT abdomen and pelvis showed cirrhosis, fatty infiltration of the liver, no focal hepatic lesions or evidence of biliary obstruction, could be superimposed hepatitis.  Evidence of portal venous hypertension with portal venous collateral splenomegaly and ascites.  Distended gallbladder, suggests sludge, several gallstones. Continue IV Rocephin for 5 days for SBP prophylaxis . Continue lactulose. Prednisolone has been discontinued. Total bili is stable today at 41.1. The patient is at high risk of decompensation and has a poor prognosis.  Coagulopathy: Secondary to liver cirrhosis: Received vitamin K, FFP, INR 2.1.  Acute kidney injury:   Hepatorenal syndrome, marked volume overload:  Creatinine 2.4 at the time of admission, in 01/2018, creatinine was 0.8. 1.27 improving, continue to hold lisinopril, HCTZ.  Continue midodrine, completed octreotide, albumin IV discontinued. Continue Lasix 40 mg IV x1, 7.0 L positive balance.  Hypokalemia: Monitor and supplement as necessary.  Hyponatremia with lactic acidosis: Sodium 120 at the time of admission with hyperkalemia, patient was placed on IV fluids. Sodium stable at 133. Follow sodium and creatinine with diuresis.  History of alcohol abuse: Heavy drinker, placed on CIWA scale with Ativan, high risk of withdrawals. Continue thiamine, folate, MVI.  Hypotension: BP currently stable, continue midodrine.  I have seen and examined this patient myself. I have spent 36 minutes in his evaluation and care.  Code Status: Full CODE STATUS DVT Prophylaxis:   SCD's Family Communication: Discussed in detail with the patient, all imaging results, lab results explained to the patient.    Elenor Wildes, DO [facility] Direct contact: see [email or web address]  7PM-7AM contact night coverage as above 08/23/2018, 4:23 PM  LOS: 7 days

## 2018-08-24 DIAGNOSIS — R1010 Upper abdominal pain, unspecified: Secondary | ICD-10-CM

## 2018-08-24 DIAGNOSIS — R740 Nonspecific elevation of levels of transaminase and lactic acid dehydrogenase [LDH]: Secondary | ICD-10-CM

## 2018-08-24 LAB — COMPREHENSIVE METABOLIC PANEL
ALT: 137 U/L — ABNORMAL HIGH (ref 0–44)
AST: 159 U/L — ABNORMAL HIGH (ref 15–41)
Albumin: 2.6 g/dL — ABNORMAL LOW (ref 3.5–5.0)
Alkaline Phosphatase: 135 U/L — ABNORMAL HIGH (ref 38–126)
Anion gap: 15 (ref 5–15)
BUN: 49 mg/dL — ABNORMAL HIGH (ref 6–20)
CO2: 22 mmol/L (ref 22–32)
Calcium: 9.5 mg/dL (ref 8.9–10.3)
Chloride: 95 mmol/L — ABNORMAL LOW (ref 98–111)
Creatinine, Ser: 1.47 mg/dL — ABNORMAL HIGH (ref 0.61–1.24)
GFR calc Af Amer: >60 mL/min (ref >60)
GFR calc non Af Amer: 58 mL/min — ABNORMAL LOW (ref >60)
Glucose, Bld: 104 mg/dL — ABNORMAL HIGH (ref 70–99)
Potassium: 3.4 mmol/L — ABNORMAL LOW (ref 3.5–5.1)
Sodium: 132 mmol/L — ABNORMAL LOW (ref 135–145)
Total Bilirubin: >50 mg/dL (ref 0.3–1.2)
Total Protein: 6.5 g/dL (ref 6.5–8.1)

## 2018-08-24 LAB — CBC
HCT: 29.2 % — ABNORMAL LOW (ref 39.0–52.0)
Hemoglobin: 10.5 g/dL — ABNORMAL LOW (ref 13.0–17.0)
MCH: 32.8 pg (ref 26.0–34.0)
MCHC: 36 g/dL (ref 30.0–36.0)
MCV: 91.3 fL (ref 80.0–100.0)
Platelets: 93 10*3/uL — ABNORMAL LOW (ref 150–400)
RBC: 3.2 MIL/uL — ABNORMAL LOW (ref 4.22–5.81)
RDW: 20.7 % — ABNORMAL HIGH (ref 11.5–15.5)
WBC: 14 10*3/uL — ABNORMAL HIGH (ref 4.0–10.5)
nRBC: 0 % (ref 0.0–0.2)

## 2018-08-24 LAB — PROTIME-INR
INR: 1.9 — ABNORMAL HIGH (ref 0.8–1.2)
Prothrombin Time: 21.7 seconds — ABNORMAL HIGH (ref 11.4–15.2)

## 2018-08-24 MED ORDER — DIPHENHYDRAMINE HCL 25 MG PO CAPS: 25.0000 mg | ORAL_CAPSULE | Freq: Three times a day (TID) | ORAL | Status: DC | PRN

## 2018-08-24 MED ORDER — FUROSEMIDE 10 MG/ML IJ SOLN
40.0000 mg | Freq: Every day | INTRAMUSCULAR | Status: DC
  Administered 2018-08-25: 40 mg via INTRAVENOUS
  Filled 2018-08-24: qty 4

## 2018-08-24 MED ORDER — HYDROMORPHONE HCL 2 MG PO TABS
1.0000 mg | ORAL_TABLET | Freq: Four times a day (QID) | ORAL | Status: DC | PRN
  Administered 2018-08-24: 18:00:00 1 mg via ORAL
  Filled 2018-08-24 (×2): qty 1

## 2018-08-24 MED ORDER — POTASSIUM CHLORIDE CRYS ER 20 MEQ PO TBCR
40.0000 meq | EXTENDED_RELEASE_TABLET | Freq: Once | ORAL | Status: AC
  Administered 2018-08-24: 13:00:00 40 meq via ORAL
  Filled 2018-08-24: qty 2

## 2018-08-24 MED ORDER — CLONAZEPAM 1 MG PO TABS
1.0000 mg | ORAL_TABLET | Freq: Two times a day (BID) | ORAL | Status: DC | PRN
  Administered 2018-08-24 – 2018-08-25 (×2): 1 mg via ORAL
  Filled 2018-08-24 (×2): qty 1

## 2018-08-24 NOTE — Progress Notes (Signed)
Daily Progress Note   Patient Name: Joseph Valenzuela       Date: 08/24/2018 DOB: 12/04/1975  Age: 43 y.o. MRN#: [id number] Attending Physician: Fran LowesSwayze, Ava, DO Primary Care Physician: Kristian CoveyBurchette, Bruce W, MD Admit Date: 08/15/2018  Reason for Consultation/Follow-up: Establishing goals of care  Subjective: Patient awake, alert, oriented. Visiting with family at bedside. Recently given prn Fentanyl for complaints of back/leg discomfort.   GOC:  F/u GOC with patient, mother Arline Asp(Cindy), and aunt at bedside. Patient gives permission to discuss medical conditions with his mother and aunt.   Introduced Palliative Medicine as specialized medical care for people living with serious illness. It focuses on providing relief from the symptoms and stress of a serious illness. The goal is to improve quality of life for both the patient and the family.  Discussed in detail events leading up to admission and course of hospitalization including diagnoses, interventions, plan of care, and poor prognosis. Reviewed GI recommendations including prognosis and recommendation for hospice services. Explained to family why [name] is not a transplant candidate. Explained high risk for decompensation and worsening lab values.   Family tearful but appreciative of transparent information. [name] speaks of wanting to "fight" but also understand poor prognosis. He again shares his plan to stay sober and compliant with medications.   [name] allows me to update his mother and aunt on decision for DNR/DNI yesterday. Mother agrees and shares that she does not want to see [name] in pain or to suffer.   [name] is most worried about applying for disability/Medicaid. He is waiting to meet with social work regarding this.   Reviewed AD packet  with [name] and mother. He is getting his affairs in order. Explained that when they are ready to notarize, to contact nurse who will notify chaplain to find notary.    Hospice options and philosophy discussed. Emphasized focus on comfort, peace, dignity at EOL. Also management of EOL symptoms and preventing recurrent hospitalization. [name] shares that he was "born in this hospital" and does NOT want to "die in this hospital."   [name] does not have insurance. Discussed possible charity case to work on getting him home with hospice services. Explained that family will need to be readily available to support Larence at home.   Discussed symptom management. [name] is anxious. PTA, [name] was prescribed  Klonopin 1mg  PO BID prn. Shaquon is experiencing ongoing back and leg pain. We discussed initiation of oral medication that could possibly be managed by outpatient hospice agency.   Therapeutic listening as patient and family share stories. Vinson speaks of his bucket list request to 'sky dive naked.' Jenny Reichmann joked that they could make this happen.   Devonn is thankful he has been able to visit with family today.   Questions and concerns were addressed.  Hard Choices booklet left for review. PMT contact information given. Emotional support provided.    Length of Stay: 9  Current Medications: Scheduled Meds:  . folic acid  1 mg Oral Daily  . lactulose  20 g Oral BID  . magic mouthwash  10 mL Oral QID  . midodrine  10 mg Oral TID WC  . multivitamin with minerals  1 tablet Oral Daily  . octreotide  100 mcg Subcutaneous Q8H  . pantoprazole  40 mg Oral Daily  . rifaximin  550 mg Oral BID  . thiamine  100 mg Oral Daily    Continuous Infusions:   PRN Meds: clonazePAM, fentaNYL (SUBLIMAZE) injection, ipratropium-albuterol, ondansetron **OR** ondansetron (ZOFRAN) IV  Physical Exam Vitals signs and nursing note reviewed.  Constitutional:      General: He is awake.  HENT:     Head: Normocephalic and  atraumatic.  Pulmonary:     Effort: No tachypnea, accessory muscle usage or respiratory distress.  Abdominal:     Tenderness: There is no abdominal tenderness.  Skin:    General: Skin is warm and dry.     Coloration: Skin is jaundiced.  Neurological:     Mental Status: He is alert and oriented to person, place, and time.  Psychiatric:        Mood and Affect: Mood normal.        Speech: Speech normal.        Behavior: Behavior normal.        Cognition and Memory: Cognition normal.            Vital Signs: BP 115/84 (BP Location: Right Arm)   Pulse 91   Temp 98.9 F (37.2 C) (Oral)   Resp 16   Ht 5\' 9"  (1.753 m)   Wt 132 kg   SpO2 99%   BMI 42.97 kg/m  SpO2: SpO2: 99 % O2 Device: O2 Device: Room Air O2 Flow Rate:    Intake/output summary:   Intake/Output Summary (Last 24 hours) at 08/24/2018 1719 Last data filed at 08/24/2018 1257 Gross per 24 hour  Intake -  Output 225 ml  Net -225 ml   LBM: Last BM Date: 08/23/18 Baseline Weight: Weight: 130.5 kg Most recent weight: Weight: 132 kg       Palliative Assessment/Data: PPS 60%    Flowsheet Rows     Most Recent Value  Intake Tab  Referral Department  Hospitalist  Unit at Time of Referral  Med/Surg Unit  Palliative Care Primary Diagnosis  Other (Comment) [liver cirrhosis, alcoholic hepatitis, cardiorenal syndrome]  Palliative Care Type  New Palliative care  Reason for referral  Clarify Goals of Care  Date first seen by Palliative Care  08/23/18  Clinical Assessment  Palliative Performance Scale Score  60%  Psychosocial & Spiritual Assessment  Palliative Care Outcomes  Patient/Family meeting held?  Yes  Who was at the meeting?  patient  Palliative Care Outcomes  Clarified goals of care, Provided end of life care assistance, Provided psychosocial or spiritual support, Changed CPR status,  ACP counseling assistance      Patient Active Problem List   Diagnosis Date Noted  . Palliative care by specialist   .  Goals of care, counseling/discussion   . Acute post-hemorrhagic anemia   . AKI (acute kidney injury) (HCC)   . Alcoholic cirrhosis of liver with ascites (HCC)   . Jaundice   . Hyponatremia 08/16/2018  . Transaminitis 08/16/2018  . Ascites   . Acute liver failure without hepatic coma   . Hematemesis with nausea   . Bleeding esophageal varices (HCC)   . UGIB (upper gastrointestinal bleed) 08/15/2018  . Alcohol abuse 02/26/2018  . Varicose veins of left lower extremity 01/09/2018  . Spleen enlargement 06/17/2013  . Abdominal pain 06/17/2013  . Erectile dysfunction 03/26/2013  . Severe obesity (BMI >= 40) (HCC) 03/26/2013  . Morbid obesity (HCC) 10/01/2010  . GERD (gastroesophageal reflux disease) 10/01/2010  . Anxiety and depression 10/01/2010  . Hypertension 10/01/2010  . BACK PAIN, CHRONIC 09/29/2009  . Vitamin B 12 deficiency 09/16/2009  . CHEST PAIN UNSPECIFIED 09/16/2009  . EDEMA 09/15/2009  . HERNIATED DISC 12/08/2008  . DISC DISEASE, LUMBAR 08/16/2008  . ASTHMA 03/11/2008  . History of alcohol abuse 03/06/2008  . EXTRINSIC ASTHMA, UNSPECIFIED 03/01/2008  . DEPRESSION 09/18/2007  . Obesity 07/31/2007  . GERD 07/03/2007  . IRRITABLE BOWEL SYNDROME 07/03/2007  . Fatty liver 07/03/2007  . ABDOMINAL PAIN-EPIGASTRIC 07/03/2007  . BENIGN POSITIONAL VERTIGO 05/21/2007  . Anxiety state 04/26/2007  . Essential hypertension 04/26/2007    Palliative Care Assessment & Plan   Patient Profile: 43 y.o. male  with past medical history of depression, anxiety, ETOH abuse, HTN, Barrett's esophagus, hiatal hernia, IBS, fatty liver, admitted on 08/15/2018 with 2-week history of nausea, vomiting, hematemesis, and right upper quadrant abdominal pain. Actively alcohol abuse, reports fifth almost every day. In ED, patient jaundiced with creatinine of 2.48, elevated LFT's, and total bili of 28.5 (now 41.5). EGD 6/18 s/p grade 2 varices in distal esophagus with mild bleeding, 5 bands placed.  Severe portal hypertensive gastropathy. Also with hepatorenal syndrome. GI and nephrology following. Very high risk for decompensation with poor prognosis. Not a candidate for liver transplant with recent alcohol use. Palliative medicine consultation for goals of care.   Assessment: Acute upper GIB Acute alcoholic hepatitis ETOH liver cirrhosis Transaminitis Hyperbilirubinemia Jaundice Coagulopathy Acute kidney injury ETOH abuse Anxiety state  Recommendations/Plan:  DNR/DNI  Continue medical management. Patient understands poor prognosis but also wishes to 'fight.' He is determined to maintain sobriety and stay compliant with medications. He understands he is not a liver transplant candidate.  Patient wishes to get his affairs in order including AD packet. Patient/mother will notify RN if ready to complete AD packet this weekend.   Introduced hospice options and philosophy. Patient/family considering home hospice services. Patient does not have insurance. TOC RN/SW consulted to assist in possible charity case with a hospice agency.   TOC RN/SW to assist patient with questions regarding applying for disability and/or Medicaid.   Symptom management  Klonopin 1mg  PO BID prn anxiety (home dose)  Dilaudid 1mg  PO q6h prn moderate pain/dyspnea  Continue prn fentanyl for severe pain.    Code Status: DNR/DNI   Code Status Orders  (From admission, onward)         Start     Ordered   08/23/18 1259  Do not attempt resuscitation (DNR)  Continuous    Question Answer Comment  In the event of cardiac or respiratory ARREST Do not call  a "code blue"   In the event of cardiac or respiratory ARREST Do not perform Intubation, CPR, defibrillation or ACLS   In the event of cardiac or respiratory ARREST Use medication by any route, position, wound care, and other measures to relive pain and suffering. May use oxygen, suction and manual treatment of airway obstruction as needed for comfort.       08/23/18 1258        Code Status History    Date Active Date Inactive Code Status Order ID Comments User Context   08/15/2018 1419 08/23/2018 1258 Full Code [id number]  Carron CurieHijazi, Ali, MD ED   02/26/2018 0755 02/26/2018 1526 Full Code [id number]  Charm RingsLord, Jamison Y, NP ED   Advance Care Planning Activity       Prognosis:   Poor prognosis months if not weeks with high risk for decompensation.   Discharge Planning:  To Be Determined: Possibly home with hospice services if family can support  Care plan was discussed with patient, mother, aunt, RN  Thank you for allowing the Palliative Medicine Team to assist in the care of this patient.   Time In: 1530 Time Out: 1650 Total Time 80 Prolonged Time Billed  yes      Greater than 50%  of this time was spent counseling and coordinating care related to the above assessment and plan.  Vennie HomansMegan Sonyia Muro, DNP, FNP-C Palliative Medicine Team  Phone: [phone] Fax: [phone]  Please contact Palliative Medicine Team phone at [phone] for questions and concerns.

## 2018-08-24 NOTE — Progress Notes (Addendum)
Daily Rounding Note  08/24/2018, 9:32 AM  LOS: 9 days   SUBJECTIVE:   Chief complaint:    Alcoholic hepatitis and cirrhosis.   Palliative care has seen the patient and he is now DNR, no heroic measures but wants all current measures to continue.  Lower extremity edema persists.  Some dyspnea.  No nausea, vomiting, appetite fair. Urine output ok, not being measured. 3 or 4 BM yest, 1 today so far.   No nausea, anorexia persists.     OBJECTIVE:         Vital signs in last 24 hours:    Temp:  [97.4 F (36.3 C)-97.8 F (36.6 C)] 97.7 F (36.5 C) (06/26 0639) Pulse Rate:  [72-85] 72 (06/26 0853) Resp:  [15-18] 18 (06/26 0853) BP: (91-149)/(42-115) 94/57 (06/26 0853) SpO2:  [97 %-100 %] 98 % (06/26 0853) Last BM Date: 08/23/18 Filed Weights   08/15/18 1237 08/21/18 0604  Weight: 130.5 kg 132 kg   General: remains jaundiced though not as yellow as would be expected with Bili >40.  Alert, comfortable   Heart: RRR Chest: clear bil.  No labored breathing Abdomen: soft, obese, active BS.  Minor upper abd tenderness  Extremities: edema persists Neuro/Psych:  Oriented x 3.  More calm than in past days.    Intake/Output from previous day: 06/25 0701 - 06/26 0700 In: 240 [P.O.:240] Out: -   Intake/Output this shift: No intake/output data recorded.  Lab Results: Recent Labs    08/22/18 0353 08/23/18 0631 08/24/18 0711  WBC 9.3 11.2* 14.0*  HGB 9.8* 9.8* 10.5*  HCT 26.9* 26.8* 29.2*  PLT 105* PLATELET CLUMPS NOTED ON SMEAR, UNABLE TO ESTIMATE 93*   BMET Recent Labs    08/22/18 0353 08/22/18 1915 08/23/18 0631 08/24/18 0711  NA 132*  --  133* 132*  K 2.8* 3.3* 3.1* 3.4*  CL 93*  --  95* 95*  CO2 25  --  24 22  GLUCOSE 111*  --  100* 104*  BUN 49*  --  46* 49*  CREATININE 1.27*  --  1.27* 1.47*  CALCIUM 9.7  --  9.5 9.5   LFT Recent Labs    08/22/18 0353 08/23/18 0631 08/24/18 0711  PROT 6.4* 6.4*  6.5  ALBUMIN 2.8* 2.6* 2.6*  AST 148* 147* 159*  ALT 108* 116* 137*  ALKPHOS 126 125 135*  BILITOT 41.5* 41.1* >50.0*   PT/INR Recent Labs    08/23/18 0631 08/24/18 0711  LABPROT 23.6* 21.7*  INR 2.1* 1.9*   Scheduled Meds: . folic acid  1 mg Oral Daily  . lactulose  20 g Oral BID  . magic mouthwash  10 mL Oral QID  . midodrine  10 mg Oral TID WC  . multivitamin with minerals  1 tablet Oral Daily  . octreotide  100 mcg Subcutaneous Q8H  . pantoprazole  40 mg Oral Daily  . potassium chloride  40 mEq Oral Once  . rifaximin  550 mg Oral BID  . thiamine  100 mg Oral Daily   Continuous Infusions: PRN Meds:.ALPRAZolam, fentaNYL (SUBLIMAZE) injection, ipratropium-albuterol, ondansetron **OR** ondansetron (ZOFRAN) IV   ASSESMENT:   *Severe alcoholic hepatitis, baseline alcoholic cirrhosis. No improvement with prednisolone after 1 week, discontinued on 6/24.   T bili continues to climb. Not a transplant candidate due to active ETOH at admission, no insurance.  Mom is his social support.    *   Rising WBCs.  *Hypokalemia. Persists.    *  Hyponatremia. Overall improved.    *HRS, AKI. Had improved but BUN/creatinine rising over the last 24 hours.. Continues on Midodrine, SQ octreotide..  Albumin and sandostatin discontinued.    * S/p banding of esophageal varices 6/18. Severe portal hypertensive gastropathy. Completed octreotide drip and Rocephin. On Magic mouthwash for esophageal candidiasis.  * Normocytic anemia, stable. Due to blood loss as well as anemia of chronic disease.  *  Leukocytosis.  No ascites (thus noSBP) on ultrasounds 6/18 and 6/23  * Coagulopathy.  Persists, slightly improved.  Received vitamin K early on.  FFP administered pre-EGD.  * Thrombocytopenia. Splenomegaly.     * Borderline HE. On lactulose, Rifaximin added 6/24.     PLAN   *   Given overall prognosis, and depending on how renal wants to address the  renal decline, we could reduce lab frequency to 3 or 4 x weekly instead of daily. Right now no future lab orders in place.      Jennye MoccasinSarah Gribbin  08/24/2018, 9:32 AM Phone [phone]    Attending physician's note   I have taken an interval history, reviewed the chart and examined the patient. I agree with the Advanced Practitioner's note, impression and recommendations.    DecompensatedETOH livercirrhosis (Child's class C) withportalhypertensionwithacute on chronic liver failure. MELD-Na 34  Asciteshas resolved. Has generalized anasarca.  HE-well-controlled on lactulose and rifaximin. Noasterixis.    H/Ovariceal hemorrhages/p EVL 08/16/2018,coagulopathy with pancytopenia,  Bilirubin increased (>50), from adm TB 28, 9 days ago. Unresponsive to prednisolone.  HRS on midrodrine and octreotide. IValbumin discontinuedd/tgeneralized anasarca/fluid overload.On IV Lasix. Crimproving.  Nephrology following.  No new GI recommendations except to continue supportive care. Not a candidate for liver transplant d/t ongoing alcohol abuse.  Will follow peripherally over the weekend.  Agree with DNR.  Overall very poor prognosis (ABIC score 10-12 s/o <25% survival at 90 days).  Consider home hospice care.  Edman Circleaj Aristotle Lieb, MD [facility] [phone].

## 2018-08-24 NOTE — Progress Notes (Signed)
[facility]    NEPHROLOGY PROGRESS NOTE  SUBJECTIVE:   Ins/outs not charted.  Patient reports that he visited family today - they just left and are coming back tomorrow.  He is getting his affairs in order and he understands his prognosis.  He appreciates palliative and nursing staff, teams.  Review of systems:  Edema +  Some shortness of breath  Denies n/v Getting meds for pain   OBJECTIVE:  Vitals:   08/24/18 0853 08/24/18 1506  BP: (!) 94/57 115/84  Pulse: 72 91  Resp: 18 16  Temp:  98.9 F (37.2 C)  SpO2: 98% 99%    Intake/Output Summary (Last 24 hours) at 08/24/2018 1711 Last data filed at 08/24/2018 1257 Gross per 24 hour  Intake -  Output 225 ml  Net -225 ml      General: Alert to self, no acute distress   HEENT: MMM [place] AT anicteric sclera  CV:  Heart RRR  Lungs:  L/S CTA bilaterally Abd: Distended, nontender  Extremities: 2-3 bilateral lower extremity edema  skin:  No skin rash, positive jaundice  MEDICATIONS:  . folic acid  1 mg Oral Daily  . lactulose  20 g Oral BID  . magic mouthwash  10 mL Oral QID  . midodrine  10 mg Oral TID WC  . multivitamin with minerals  1 tablet Oral Daily  . octreotide  100 mcg Subcutaneous Q8H  . pantoprazole  40 mg Oral Daily  . rifaximin  550 mg Oral BID  . thiamine  100 mg Oral Daily       LABS:   CBC Latest Ref Rng & Units 08/24/2018 08/23/2018 08/22/2018  WBC 4.0 - 10.5 K/uL 14.0(H) 11.2(H) 9.3  Hemoglobin 13.0 - 17.0 g/dL 10.5(L) 9.8(L) 9.8(L)  Hematocrit 39.0 - 52.0 % 29.2(L) 26.8(L) 26.9(L)  Platelets 150 - 400 K/uL 93(L) PLATELET CLUMPS NOTED ON SMEAR, UNABLE TO ESTIMATE 105(L)    CMP Latest Ref Rng & Units 08/24/2018 08/23/2018 08/22/2018  Glucose 70 - 99 mg/dL 104(H) 100(H) -  BUN 6 - 20 mg/dL 49(H) 46(H) -  Creatinine 0.61 - 1.24 mg/dL 1.47(H) 1.27(H) -  Sodium 135 - 145 mmol/L 132(L) 133(L) -  Potassium 3.5 - 5.1 mmol/L 3.4(L) 3.1(L) 3.3(L)  Chloride 98 - 111 mmol/L 95(L) 95(L) -  CO2  22 - 32 mmol/L 22 24 -  Calcium 8.9 - 10.3 mg/dL 9.5 9.5 -  Total Protein 6.5 - 8.1 g/dL 6.5 6.4(L) -  Total Bilirubin 0.3 - 1.2 mg/dL >50.0(HH) 41.1(HH) -  Alkaline Phos 38 - 126 U/L 135(H) 125 -  AST 15 - 41 U/L 159(H) 147(H) -  ALT 0 - 44 U/L 137(H) 116(H) -    Lab Results  Component Value Date   CALCIUM 9.5 08/24/2018   CAION 1.11 (L) 01/01/2018       Component Value Date/Time   COLORURINE AMBER (A) 08/15/2018 2212   APPEARANCEUR CLOUDY (A) 08/15/2018 2212   LABSPEC 1.017 08/15/2018 2212   PHURINE 5.0 08/15/2018 2212   GLUCOSEU 50 (A) 08/15/2018 2212   HGBUR LARGE (A) 08/15/2018 2212   BILIRUBINUR MODERATE (A) 08/15/2018 2212   KETONESUR 5 (A) 08/15/2018 2212   PROTEINUR 100 (A) 08/15/2018 2212   UROBILINOGEN 0.2 11/29/2009 0506   NITRITE NEGATIVE 08/15/2018 2212   LEUKOCYTESUR NEGATIVE 08/15/2018 2212      Component Value Date/Time   HCO3 29.8 (H) 03/25/2007 1915   TCO2 27 01/01/2018 1422       Component Value Date/Time  IRON 119 07/05/2011 1144   FERRITIN 39.7 07/05/2011 1144   IRONPCTSAT 22.6 07/05/2011 1144       ASSESSMENT/PLAN:    43 year old male patient with a past medical history significant for hypertension, depression, Barrett's esophagus, and heavy alcohol use who presented with a two-week history of nausea, vomiting, hematemesis, and right upper quadrant pain.  He was noted to have markedly elevated LFTs and a total bili of 28.5.    1.  Acute kidney injury.  Secondary to hepatorenal syndrome.  Baseline serum creatinine 0.8 from 02/26/2018 - Continue Midodrine.  Can continue octreotide if patient willing.  Understanding that these are temporizing measures.  If he is to discharge home with hospice can continue midodrine at home and oral lasix  - Lasix 40 mg IV daily while inpatient   2. Decompensated alcoholic cirrhosis - GI following   3. Hypokalemia - repleted earlier today per primary.  Keep mag replete   4.  Anemia - stable.  No acute indication  for PRBC's.  GI is recommending home hospice care and I agree.  He has a very poor prognosis.  Appreciate palliative care.

## 2018-08-24 NOTE — Progress Notes (Addendum)
Family here to visit pt. Following policy including wearing masks. Will obtain VS after family visit.

## 2018-08-24 NOTE — Progress Notes (Signed)
PROGRESS NOTE  Joseph Valenzuela WJX:[id number] DOB: 10/22/1975 DOA: 08/15/2018 PCP: Kristian CoveyBurchette, Bruce W, MD  Brief History   Patient is a 43 year old male with hypertension, depression, Barrett's esophagus, alcoholism presented with 2-week history of nausea, vomiting, hematemesis, right upper quadrant abdominal pain.  Patient has been drinking a lot, fifth almost every day.   In ED, patient was noted to be jaundiced, sodium 120, potassium 5.2, creatinine 2.48, hemoglobin 10.8.  Elevated LFTs with total bili of 28.5. GI and nephrology have been consulted. COVID-19 test negative.  The patient has been receiving IV octreotide, IV protonix, and IV albumin as well as lasix, lactulose, and midodrine. He has also received supplemental thiamine and folate. His creatinine has responded nicely so far, although the patient remains significantly swollen in his lower extremities. He has had no further bleeding and his hemoglobin is slowly increasing. Despite these successes the patient remains very ill and at high risk of fulminant liver failure, renal failure and death.  Yesterday the patient was very frustrated and was saying that he will leave AMA, if he is not discharged. I explained to him those things that make his leaving against medical advise dangerous. Namely the fact that his bilirubin is again increasing and that his creatinine, although improved, is only better because he had IV albumin and IV octreotide. He is at very high risk of rebleeding. Ultimately after speaking with me as well as GI and nephrology and speaking with his mother, the patient has decided to stay to complete treatment. He has also committed to abstaining from alcohol and aspiring to be placed on a liver transplant list. He has decided to make himself a DNR. Consultants  . Nephrology . Gastroenterology . Palliative care  Procedures  . EGD with banding of esophageal varicies  Antibiotics   Anti-infectives (From admission,  onward)   Start     Dose/Rate Route Frequency Ordered Stop   08/22/18 2200  rifaximin (XIFAXAN) tablet 550 mg     550 mg Oral 2 times daily 08/22/18 1833     08/17/18 1200  cefTRIAXone (ROCEPHIN) 2 g in sodium chloride 0.9 % 100 mL IVPB     2 g 200 mL/hr over 30 Minutes Intravenous Every 24 hours 08/17/18 1145 08/21/18 1337     .   Subjective  The patient is awake, alert, and oriented x 3. No new complaints. He is looking forward to visit from his mother and aunt.  Objective   Vitals:  Vitals:   08/24/18 0639 08/24/18 0853  BP: (!) 99/57 (!) 94/57  Pulse: 77 72  Resp: 16 18  Temp: 97.7 F (36.5 C)   SpO2: 98% 98%    Exam:  Constitutional:  The patient is awake, alert, and oriented x 3. No acute distress Eyes:  Marland Kitchen. Sclera are icteric. Respiratory:  . No increased work of breathing.  . No wheezes, rales, or rhonchi. . No tactile fremitus. Cardiovascular:  . Regular, rate, and rhythm . No murmurs, ectopy, or gallups . No lateral PMI. No thrills. Abdomen:  . Abdomen soft, non-tender, non-distended . No hernias, masses, or organomegaly . Normoactive bowel  Musculoskeletal:  . Skin is taut. 4+ pitting edema bilaterally Skin:  . Jaundiced. . palpation of skin: no induration or nodules Neurologic:  . CN 2-12 intact . Sensation all 4 extremities intact Psychiatric:  . Mental status o Mood, affect appropriate o Orientation to person, place, time  . judgment and insight appears poor  I have personally reviewed the following:  Today's Data  . Vitals, CBC, CMP  Scheduled Meds: . folic acid  1 mg Oral Daily  . lactulose  20 g Oral BID  . magic mouthwash  10 mL Oral QID  . midodrine  10 mg Oral TID WC  . multivitamin with minerals  1 tablet Oral Daily  . octreotide  100 mcg Subcutaneous Q8H  . pantoprazole  40 mg Oral Daily  . rifaximin  550 mg Oral BID  . thiamine  100 mg Oral Daily   Continuous Infusions:   Active Problems:   GERD   Morbid obesity  (HCC)   Anxiety and depression   Abdominal pain   Alcohol abuse   UGIB (upper gastrointestinal bleed)   Hyponatremia   Transaminitis   Ascites   Acute liver failure without hepatic coma   Hematemesis with nausea   Bleeding esophageal varices (HCC)   Alcoholic cirrhosis of liver with ascites (HCC)   Jaundice   Acute post-hemorrhagic anemia   AKI (acute kidney injury) ([place])   Palliative care by specialist   Goals of care, counseling/discussion   LOS: 9 days    A & P   Acute upper GI bleed, acute blood loss anemia: Patient has a history of GERD, Barrett's esophagus, possible EtOH gastritis or portal hypertensive gastritis.  FOBT positive. Underwent EGD on 6/18 showed, grade 2 varices in the distal esophagus with mild bleeding, 5 bands placed, patchy white plaques in the entire esophagus, severe portal hypertensive gastropathy. GI following, completed IV PPI and octreotide drip for 72 hours.  Continue PPI 40 mg every 12 hours.  H&H stable. Octreotide drip has been converted to subcutaneous per GI and nephrology.  Acute alcoholic hepatitis, transaminitis with hyperbilirubinemia with underlying cirrhosis, jaundice: Patient has been drinking heavily, states fifth of hard liquor almost every day. He may be considered for liver transplantation if he is able to remain sober. CT abdomen and pelvis showed cirrhosis, fatty infiltration of the liver, no focal hepatic lesions or evidence of biliary obstruction, could be superimposed hepatitis.  Evidence of portal venous hypertension with portal venous collateral splenomegaly and ascites.  Distended gallbladder, suggests sludge, several gallstones. Continue IV Rocephin for 5 days for SBP prophylaxis . Continue lactulose. Prednisolone has been discontinued. Total bili is increasing today at greater than 50. The patient is at high risk of decompensation and has a poor prognosis.  Coagulopathy: Secondary to liver cirrhosis: Received vitamin K, FFP, INR  1.9.  Acute kidney injury:   Hepatorenal syndrome, marked volume overload: Creatinine 2.4 at the time of admission, in 01/2018, creatinine was 0.8. 1.47 today. Continue to hold lisinopril, HCTZ.  Continue midodrine, sub Q octreotide, albumin IV discontinued.   Hypokalemia: Monitor and supplement as necessary.  Hyponatremia with lactic acidosis: Sodium 120 at the time of admission with hyperkalemia, patient was placed on IV fluids. Sodium stable at 133. Follow sodium and creatinine with diuresis.  History of alcohol abuse: Heavy drinker, placed on CIWA scale with Ativan, high risk of withdrawals. Continue thiamine, folate, MVI. Pt has expressed an interest in abstinence in order to perhaps get on liver transplant list.   Hypotension: BP currently stable, continue midodrine.  I have seen and examined this patient myself. I have spent 90minutes in his evaluation and care.  Code Status: DNR DVT Prophylaxis:   SCD's Family Communication: Discussed in detail with the patient, all imaging results, lab results explained to the patient.    Maribel Luis, DO [facility] Direct contact: see [email or web address]  7PM-7AM  contact night coverage as above 08/24/2018, 2:33 PM  LOS: 7 days

## 2018-08-25 LAB — COMPREHENSIVE METABOLIC PANEL
ALT: 130 U/L — ABNORMAL HIGH (ref 0–44)
AST: 137 U/L — ABNORMAL HIGH (ref 15–41)
Albumin: 2.3 g/dL — ABNORMAL LOW (ref 3.5–5.0)
Alkaline Phosphatase: 125 U/L (ref 38–126)
Anion gap: 15 (ref 5–15)
BUN: 57 mg/dL — ABNORMAL HIGH (ref 6–20)
CO2: 20 mmol/L — ABNORMAL LOW (ref 22–32)
Calcium: 9.1 mg/dL (ref 8.9–10.3)
Chloride: 96 mmol/L — ABNORMAL LOW (ref 98–111)
Creatinine, Ser: 1.46 mg/dL — ABNORMAL HIGH (ref 0.61–1.24)
GFR calc Af Amer: >60 mL/min (ref >60)
GFR calc non Af Amer: 59 mL/min — ABNORMAL LOW (ref >60)
Glucose, Bld: 99 mg/dL (ref 70–99)
Potassium: 3.3 mmol/L — ABNORMAL LOW (ref 3.5–5.1)
Sodium: 131 mmol/L — ABNORMAL LOW (ref 135–145)
Total Bilirubin: 40 mg/dL (ref 0.3–1.2)
Total Protein: 6 g/dL — ABNORMAL LOW (ref 6.5–8.1)

## 2018-08-25 LAB — PROTIME-INR
INR: 2 — ABNORMAL HIGH (ref 0.8–1.2)
Prothrombin Time: 22.2 seconds — ABNORMAL HIGH (ref 11.4–15.2)

## 2018-08-25 MED ORDER — LACTULOSE 10 GM/15ML PO SOLN: 20.0000 g | Freq: Two times a day (BID) | ORAL | 0 refills | Status: AC

## 2018-08-25 MED ORDER — CLONAZEPAM 1 MG PO TABS: ORAL_TABLET | ORAL | 5 refills | Status: AC

## 2018-08-25 MED ORDER — ADULT MULTIVITAMIN W/MINERALS CH: 1.0000 | ORAL_TABLET | Freq: Every day | ORAL | 0 refills | Status: DC

## 2018-08-25 MED ORDER — MIDODRINE HCL 10 MG PO TABS: 10.0000 mg | ORAL_TABLET | Freq: Three times a day (TID) | ORAL | 0 refills | Status: DC

## 2018-08-25 MED ORDER — ONDANSETRON HCL 4 MG PO TABS: 4.0000 mg | ORAL_TABLET | Freq: Four times a day (QID) | ORAL | 0 refills | Status: AC | PRN

## 2018-08-25 MED ORDER — POTASSIUM CHLORIDE CRYS ER 20 MEQ PO TBCR
40.0000 meq | EXTENDED_RELEASE_TABLET | Freq: Once | ORAL | Status: AC
  Administered 2018-08-25: 40 meq via ORAL
  Filled 2018-08-25: qty 2

## 2018-08-25 MED ORDER — FOLIC ACID 1 MG PO TABS: 1.0000 mg | ORAL_TABLET | Freq: Every day | ORAL | 0 refills | Status: AC

## 2018-08-25 MED ORDER — FOLIC ACID 1 MG PO TABS: 1.0000 mg | ORAL_TABLET | Freq: Every day | ORAL | 0 refills | Status: DC

## 2018-08-25 MED ORDER — PANTOPRAZOLE SODIUM 40 MG PO TBEC: 40.0000 mg | DELAYED_RELEASE_TABLET | Freq: Every day | ORAL | 0 refills | Status: DC

## 2018-08-25 MED ORDER — THIAMINE HCL 100 MG PO TABS: 100.0000 mg | ORAL_TABLET | Freq: Every day | ORAL | 0 refills | Status: DC

## 2018-08-25 MED ORDER — ERYTHROMYCIN BASE 500 MG PO TABS: 500.0000 mg | ORAL_TABLET | Freq: Two times a day (BID) | ORAL | 0 refills | Status: AC

## 2018-08-25 MED ORDER — PANTOPRAZOLE SODIUM 40 MG PO TBEC: 40.0000 mg | DELAYED_RELEASE_TABLET | Freq: Every day | ORAL | 0 refills | Status: AC

## 2018-08-25 MED ORDER — HYDROMORPHONE HCL 2 MG PO TABS: 1.0000 mg | ORAL_TABLET | Freq: Four times a day (QID) | ORAL | 0 refills | Status: DC | PRN

## 2018-08-25 MED ORDER — FUROSEMIDE 40 MG PO TABS: 40.0000 mg | ORAL_TABLET | Freq: Two times a day (BID) | ORAL | 11 refills | Status: DC

## 2018-08-25 MED ORDER — RIFAXIMIN 550 MG PO TABS: 550.0000 mg | ORAL_TABLET | Freq: Two times a day (BID) | ORAL | 0 refills | Status: DC

## 2018-08-25 MED ORDER — MIDODRINE HCL 10 MG PO TABS: 10.0000 mg | ORAL_TABLET | Freq: Three times a day (TID) | ORAL | 0 refills | Status: AC

## 2018-08-25 MED ORDER — ONDANSETRON HCL 4 MG PO TABS: 4.0000 mg | ORAL_TABLET | Freq: Four times a day (QID) | ORAL | 0 refills | Status: DC | PRN

## 2018-08-25 MED ORDER — FUROSEMIDE 40 MG PO TABS: 40.0000 mg | ORAL_TABLET | Freq: Two times a day (BID) | ORAL | 0 refills | Status: AC

## 2018-08-25 MED ORDER — LACTULOSE 10 GM/15ML PO SOLN: 20.0000 g | Freq: Two times a day (BID) | ORAL | 0 refills | Status: DC

## 2018-08-25 MED ORDER — THIAMINE HCL 100 MG PO TABS: 100.0000 mg | ORAL_TABLET | Freq: Every day | ORAL | 0 refills | Status: AC

## 2018-08-25 MED ORDER — ADULT MULTIVITAMIN W/MINERALS CH: 1.0000 | ORAL_TABLET | Freq: Every day | ORAL | 0 refills | Status: AC

## 2018-08-25 NOTE — TOC Transition Note (Addendum)
Transition of Care [facility]) - CM/SW Discharge Note   Patient Details  Name: Joseph Valenzuela MRN: [id number] Date of Birth: 12/03/1975  [facility] CM/SW Contact:  Carles Collet, RN Phone Number: 08/25/2018, 12:01 PM   Clinical Narrative:    [name] w patient at bedside discussed plan for discharge and his feelings about his diagnosis.  He is agreeable to DC with home hospice services through [place] through charity.  He will return home to address on file. Home has 3 steps that he states his mother and brother will be able to assist him to get inside.  He states his mother will provide transport home.  He decline DME needs, states his home bathroom is closer than his bathroom in his hospital room, denies difficulties getting up and down from toilet.  He states that his mom is on her way to the hospital now and will be staying with him.  Referral placed to [facility]. Updated that patient is charity. This CM will provide MATCH with override for Rxs for pain meds, lactulose, and xifaxin as hospice may not be able to see until tomorrow. Messaged Dr Benny Lennert about DC pain meds. Notified Anderson Malta that patient will DC w xifaxin.  Discussed that patient does not need DME currently but may in the next few days as weakness progresses. Provided Anderson Malta with patient's number and mother;s number to call at request of patient. Mom's number is [phone].  Please send patient home with GOLD DNR form.          Final next level of care: Home w Hospice Care Barriers to Discharge: No Barriers Identified   Patient Goals and CMS Choice Patient states their goals for this hospitalization and ongoing recovery are:: to go home      Discharge Placement                       Discharge Plan and [place] Date [place]: 08/25/18 Time [place]:  45 Representative spoke with at [place]: [place] Determinants of Health ([place]) Interventions     Readmission Risk Interventions No flowsheet data found.

## 2018-08-25 NOTE — Plan of Care (Signed)
  Problem: Education: Goal: Knowledge of General Education information will improve Description: Including pain rating scale, medication(s)/side effects and non-pharmacologic comfort measures Outcome: Completed/Met   Problem: Health Behavior/Discharge Planning: Goal: Ability to manage health-related needs will improve Outcome: Completed/Met   Problem: Clinical Measurements: Goal: Ability to maintain clinical measurements within normal limits will improve Outcome: Completed/Met Goal: Will remain free from infection Outcome: Completed/Met Goal: Diagnostic test results will improve Outcome: Completed/Met Goal: Cardiovascular complication will be avoided Outcome: Completed/Met   Problem: Activity: Goal: Risk for activity intolerance will decrease Outcome: Completed/Met   Problem: Coping: Goal: Level of anxiety will decrease Outcome: Completed/Met   Problem: Elimination: Goal: Will not experience complications related to bowel motility Outcome: Completed/Met   Problem: Pain Managment: Goal: General experience of comfort will improve Outcome: Completed/Met   Problem: Safety: Goal: Ability to remain free from injury will improve Outcome: Completed/Met

## 2018-08-25 NOTE — Discharge Summary (Signed)
Physician Discharge Summary  Joseph Valenzuela WUJ:[id number] DOB: 08/20/1975 DOA: 08/15/2018  PCP: Kristian CoveyBurchette, Bruce W, MD  Admit date: 08/15/2018 Discharge date: 08/25/2018  Recommendations for Outpatient Follow-up:  1. Recommendation to follow up with hospice at home 2. Abstain from alcohol. 3. Follow up with GI in one month 4. Follow up with PCP in 7-10 days.  Follow-up Information    [facility] Follow up on 08/29/2018.   Why: 9:30 am , Dr. Cain Saupeammie Fulp Contact information: [address] [place] [phone]           Discharge Diagnoses: Principal diagnosis is #1 1. End stage liver disease 2. Hyperbilirubinemia 3. Hepatic encephalopathy 4. Peripheral Edema 5. Acute alcoholic hepatitis 6. Coagulopathy 7. AKI 8. Hypokalemia 9. Alcohol abuse 10. Hypotension  Discharge Condition: Poor Disposition: Home with hospice  Diet recommendation: Heart healthy, low sodium  Filed Weights   08/15/18 1237 08/21/18 0604  Weight: 130.5 kg 132 kg    History of present illness:  Patient is a 43 year old male with hypertension, depression, Barrett's esophagus, alcoholism presented with 2-week history of nausea, vomiting, hematemesis, right upper quadrant abdominal pain. Patient has been drinking a lot, fifth almost every day.  In ED, patient was noted to be jaundiced, sodium 120, potassium 5.2, creatinine 2.48, hemoglobin 10.8. Elevated LFTs with total bili of 28.5. GI and nephrology have been consulted. COVID-19 test negative.  Hospital Course:  Patient is a 43 year old male with hypertension, depression, Barrett's esophagus, alcoholism presented with 2-week history of nausea, vomiting, hematemesis, right upper quadrant abdominal pain. Patient has been drinking a lot, fifth almost every day.  In ED, patient was noted to be jaundiced, sodium 120, potassium 5.2, creatinine 2.48, hemoglobin 10.8. Elevated LFTs with  total bili of 28.5. GI and nephrology have been consulted. COVID-19 test negative.  The patient has been receiving IV octreotide, IV protonix, and IV albumin as well as lasix, lactulose, and midodrine. He has also received supplemental thiamine and folate. His creatinine has responded nicely so far, although the patient remains significantly swollen in his lower extremities. He has had no further bleeding and his hemoglobin is slowly increasing. Despite these successes the patient remains very ill and at high risk of fulminant liver failure, renal failure and death.  Yesterday the patient was very frustrated and was saying that he will leave AMA, if he is not discharged. I explained to him those things that make his leaving against medical advise dangerous. Namely the fact that his bilirubin is again increasing and that his creatinine, although improved, is only better because he had IV albumin and IV octreotide. He is at very high risk of rebleeding. Ultimately after speaking with me as well as GI and nephrology and speaking with his mother, the patient has decided to stay to complete treatment. He has also committed to abstaining from alcohol and aspiring to be placed on a liver transplant list. He has decided to make himself a DNR.  Today's assessment: S: The patient is resting quietly this morning. The patient is somewhat lethargic today. O: Vitals:  Vitals:   08/24/18 2158 08/25/18 0854  BP: (!) 94/52 (!) 103/50  Pulse: 67 67  Resp: 16 16  Temp: (!) 97.5 F (36.4 C) (!) 97.4 F (36.3 C)  SpO2: 99% 100%    Constitutional:   The patient is somnolent this morning. He does awaken to voice. No acute distress. Respiratory:   No increased work of breathing.  No  wheezes, rales, or rhonchi.  No tactile fremitus.  Respiratory effort normal. No retractions or accessory muscle use Cardiovascular:   Regular rate and rhythm.  No murmurs, ectopy, or gallups.  No lateral PMI. No  thrills. Abdomen:   Abdomen is soft, non-tender, non-distended.  No hernias, masses, or organomegaly are appreciated.  Normoactive bowel sounds. Musculoskeletal:   No cyanosis or clubbing  There is 4+ pitting edema of bilateral lower extremities bilaterally. Skin:   Jaundiced Neurologic:   CN 2-12 intact  Sensation all 4 extremities intact Psychiatric:  o Pt seems depressed this morning.     Discharge Instructions  Discharge Instructions    Activity as tolerated - No restrictions   Complete by: As directed    Call MD for:  persistant nausea and vomiting   Complete by: As directed    Call MD for:  severe uncontrolled pain   Complete by: As directed    Diet - low sodium heart healthy   Complete by: As directed    Discharge instructions   Complete by: As directed    Hospice to follow at home. He is to follow up with GI in one month. He is to follow up with his PCP in 7-10 days.   Increase activity slowly   Complete by: As directed      Allergies as of 08/25/2018      Reactions   Hydrocodone Hives, Itching      Medication List    STOP taking these medications   Dexilant 60 MG capsule Generic drug: dexlansoprazole   lisinopril-hydrochlorothiazide 20-12.5 MG tablet Commonly known as: ZESTORETIC     TAKE these medications   clonazePAM 1 MG tablet Commonly known as: KLONOPIN Take one by mouth bid What changed:   how much to take  how to take this  when to take this   erythromycin base 500 MG tablet Commonly known as: E-MYCIN Take 1 tablet (500 mg total) by mouth 2 (two) times daily for 10 days.   folic acid 1 MG tablet Commonly known as: FOLVITE Take 1 tablet (1 mg total) by mouth daily. Start taking on: August 26, 2018   furosemide 40 MG tablet Commonly known as: Lasix Take 1 tablet (40 mg total) by mouth 2 (two) times daily.   HYDROmorphone 2 MG tablet Commonly known as: DILAUDID Take 0.5 tablets (1 mg total) by mouth every 6 (six) hours  as needed for moderate pain (dyspnea).   lactulose 10 GM/15ML solution Commonly known as: CHRONULAC Take 30 mLs (20 g total) by mouth 2 (two) times daily for 30 days.   midodrine 10 MG tablet Commonly known as: PROAMATINE Take 1 tablet (10 mg total) by mouth 3 (three) times daily with meals.   multivitamin with minerals Tabs tablet Take 1 tablet by mouth daily. Start taking on: August 26, 2018   ondansetron 4 MG tablet Commonly known as: ZOFRAN Take 1 tablet (4 mg total) by mouth every 6 (six) hours as needed for nausea.   pantoprazole 40 MG tablet Commonly known as: PROTONIX Take 1 tablet (40 mg total) by mouth daily. Start taking on: August 26, 2018   thiamine 100 MG tablet Take 1 tablet (100 mg total) by mouth daily. Start taking on: August 26, 2018      Allergies  Allergen Reactions   Hydrocodone Hives and Itching    The results of significant diagnostics from this hospitalization (including imaging, microbiology, ancillary and laboratory) are listed below for reference.    Significant  Diagnostic Studies: Ct Abdomen Pelvis Wo Contrast  Result Date: 08/15/2018 CLINICAL DATA:  Painless jaundice, fatigue and abdominal swelling. EXAM: CT ABDOMEN AND PELVIS WITHOUT CONTRAST TECHNIQUE: Multidetector CT imaging of the abdomen and pelvis was performed following the standard protocol without IV contrast. COMPARISON:  06/18/2013 FINDINGS: Lower chest: Small left pleural effusion noted with overlying atelectasis. The right lung base is clear. The heart is normal in size. No pericardial effusion. Marked eventration of the right hemidiaphragm with overlying atelectasis. Hepatobiliary: Hepatomegaly. The liver contour is irregular and the caudate lobe is prominent. The hepatic fissures are also slightly prominent. Findings consistent with cirrhosis. There is also diffuse heterogeneous fatty infiltration and possible superimposed hepatitis. No obvious hepatic lesion. The gallbladder is  distended and demonstrates high attenuation which could be sludge. There also several small calcified gallstones. The gallbladder wall is thickened but this could be due to ascites and low albumin. No intra or extrahepatic biliary dilatation to suggest biliary obstruction. Pancreas: No mass, inflammation or ductal dilatation. Spleen: Splenomegaly. Spleen measures 16 x 16 x 12.5 cm. No worrisome lesions. Adrenals/Urinary Tract: Adrenal glands and kidneys are unremarkable. No renal, ureteral or bladder calculi or obvious mass without contrast. Stomach/Bowel: The stomach, duodenum, small bowel and colon are grossly normal without oral contrast. No acute inflammatory changes, mass lesions or obstructive findings. Vascular/Lymphatic: Scattered atherosclerotic calcifications involving the aorta but no aneurysm. There are numerous borderline upper abdominal lymph nodes typical with cirrhosis. Scattered retroperitoneal lymph nodes are also noted but no mass or overt adenopathy. Reproductive: The prostate gland and seminal vesicles are unremarkable. Other: Small volume abdominal/pelvic ascites. There is also diffuse mesenteric edema and diffuse body wall edema. Musculoskeletal: No significant bony findings. IMPRESSION: 1. CT findings consistent with cirrhosis and fatty infiltration of the liver. No focal hepatic lesions or evidence of biliary obstruction. There could be superimposed hepatitis. 2. Evidence of portal venous hypertension with portal venous collaterals, splenomegaly and ascites. 3. Distended gallbladder with high attenuation material which could suggest sludge. Several gallstones are also noted. Gallbladder wall thickening likely due to ascites and low albumin. 4. Small left pleural effusion with overlying atelectasis. 5. Diffuse body wall edema. Electronically Signed   By: Marijo Sanes M.D.   On: 08/15/2018 11:53   US Renal  Result Date: 08/16/2018 CLINICAL DATA:  Initial evaluation for ascites. EXAM:  RENAL / URINARY TRACT ULTRASOUND COMPLETE COMPARISON:  None available FINDINGS: Right Kidney: Renal measurements: 12.5 x 5.5 x 6.1 cm = volume: 216.9 mL . Echogenicity within normal limits. No mass or hydronephrosis visualized. Left Kidney: Renal measurements: 13.6 x 6.2 x 7.1 cm = volume: 309.8 mL. Echogenicity within normal limits. No mass or hydronephrosis visualized. Bladder: Appears normal for degree of bladder distention. IMPRESSION: Normal renal ultrasound. No evidence for hydronephrosis or other acute finding. Electronically Signed   By: Jeannine Boga M.D.   On: 08/16/2018 20:11   US Abdomen Limited  Result Date: 08/21/2018 CLINICAL DATA:  Abdominal distension EXAM: LIMITED ABDOMEN ULTRASOUND FOR ASCITES TECHNIQUE: Limited ultrasound survey for ascites was performed in all four abdominal quadrants. COMPARISON:  CT abdomen and pelvis August 15, 2018; limited abdominal ultrasound August 16, 2018 FINDINGS: Ultrasound of the upper and lower abdomen performed to assess for potential ascites. No ascites evident. There is air-filled bowel throughout the abdomen. Spleen measures 15.3 x 17.2 x 8.2 cm with a measured splenic volume of 1, 131 cubic cm. No splenic lesions evident. IMPRESSION: 1. No demonstrable ascites. Gas-filled bowel throughout abdomen and pelvis. 2.  Splenomegaly. Electronically Signed   By: Bretta BangWilliam  Woodruff III M.D.   On: 08/21/2018 14:14   [place] Abdomen Limited  Result Date: 08/16/2018 CLINICAL DATA:  43 year old male with abdominal distension. Evaluate for ascites. EXAM: LIMITED ABDOMEN ULTRASOUND FOR ASCITES TECHNIQUE: Limited ultrasound survey for ascites was performed in all four abdominal quadrants. COMPARISON:  None. FINDINGS: A small amount of ascites is noted adjacent to the liver. A tiny amount ascites is noted pelvis. IMPRESSION: Small amount of ascites, primarily along the liver. Electronically Signed   By: Harmon PierJeffrey  Hu M.D.   On: 08/16/2018 12:18   Dg Chest Portable 1  View  Result Date: 08/15/2018 CLINICAL DATA:  Anemia.  Vomiting. EXAM: PORTABLE CHEST 1 VIEW COMPARISON:  January 01, 2018 FINDINGS: Minimal opacity in the lateral left lung base. There is an elevated right hemidiaphragm. There is also opacity in the right lung base. The cardiomediastinal silhouette is normal. IMPRESSION: 1. The opacity in the right lung base may represent a pleural effusion and atelectasis. Infiltrate not excluded. 2. Mild opacity in the lateral left lung base favored represent atelectasis. Subtle infiltrate not excluded. Electronically Signed   By: Gerome Samavid  Williams III M.D   On: 08/15/2018 10:47    Microbiology: No results found for this or any previous visit (from the past 240 hour(s)).   Labs: Basic Metabolic Panel: Recent Labs  Lab 08/21/18 0409 08/22/18 0353 08/22/18 1915 08/23/18 0631 08/24/18 0711 08/25/18 0705  NA 131* 132*  --  133* 132* 131*  K 3.1* 2.8* 3.3* 3.1* 3.4* 3.3*  CL 94* 93*  --  95* 95* 96*  CO2 23 25  --  24 22 20*  GLUCOSE 101* 111*  --  100* 104* 99  BUN 51* 49*  --  46* 49* 57*  CREATININE 1.30* 1.27*  --  1.27* 1.47* 1.46*  CALCIUM 9.2 9.7  --  9.5 9.5 9.1  MG  --  2.2  --   --   --   --    Liver Function Tests: Recent Labs  Lab 08/21/18 0409 08/22/18 0353 08/23/18 0631 08/24/18 0711 08/25/18 0705  AST 136* 148* 147* 159* 137*  ALT 94* 108* 116* 137* 130*  ALKPHOS 120 126 125 135* 125  BILITOT 39.7* 41.5* 41.1* >50.0* 40.0*  PROT 6.6 6.4* 6.4* 6.5 6.0*  ALBUMIN 2.9* 2.8* 2.6* 2.6* 2.3*   No results for input(s): LIPASE, AMYLASE in the last 168 hours. Recent Labs  Lab 08/23/18 0631  AMMONIA 33   CBC: Recent Labs  Lab 08/20/18 0343 08/21/18 0409 08/22/18 0353 08/23/18 0631 08/24/18 0711  WBC 6.8 7.3 9.3 11.2* 14.0*  HGB 9.1* 9.2* 9.8* 9.8* 10.5*  HCT 25.0* 25.1* 26.9* 26.8* 29.2*  MCV 89.6 89.0 88.5 89.6 91.3  PLT 109* 107* 105* PLATELET CLUMPS NOTED ON SMEAR, UNABLE TO ESTIMATE 93*   Cardiac Enzymes: No results  for input(s): CKTOTAL, CKMB, CKMBINDEX, TROPONINI in the last 168 hours. BNP: BNP (last 3 results) No results for input(s): BNP in the last 8760 hours.  ProBNP (last 3 results) No results for input(s): PROBNP in the last 8760 hours.  CBG: Recent Labs  Lab 08/18/18 1957  GLUCAP 139*    Active Problems:   GERD   Morbid obesity (HCC)   Anxiety and depression   Abdominal pain   Alcohol abuse   UGIB (upper gastrointestinal bleed)   Hyponatremia   Transaminitis   Ascites   Acute liver failure without hepatic coma   Hematemesis with nausea  Bleeding esophageal varices (HCC)   Alcoholic cirrhosis of liver with ascites (HCC)   Jaundice   Acute post-hemorrhagic anemia   AKI (acute kidney injury) (HCC)   Palliative care by specialist   Goals of care, counseling/discussion   Time coordinating discharge: 45 minutes.  Signed:        Alioune Barton, DO [facility]  08/25/2018, 3:52 PM

## 2018-08-25 NOTE — Progress Notes (Addendum)
Manufacturing engineer [facility]  Received referral for hospice services at home once discharged later today.  Eligibility is pending per review of [facility] physician.  Spoke with mother and caretaker Jenny Reichmann to confirm request.  She confirms she will be caring for him at home.  She did not seem to understand why he was being discharged if he was not better.  She voiced anxiety.  Spoke with RN Case Manager who was going to request MD call her to update her as well.  No DME needs currently.  Please send prescriptions for comfort home with pt until [facility] services can begin.  Please fax d/c summary to:  [phone]  Thank you, Venia Carbon RN, BSN, [facility] Liaison (in [place]) [phone]  **130pm, pt approved for hospice services at home after discharge

## 2018-08-25 NOTE — Progress Notes (Signed)
Nsg Discharge Note  Admit Date:  08/15/2018 Discharge date: 08/25/2018   Joseph Valenzuela to be D/C'd Home with hospice [facility] per MD order.  AVS completed.  Copy for chart, and copy for patient signed, and dated. Patient/caregiver able to verbalize understanding.  Discharge Medication: Allergies as of 08/25/2018      Reactions   Hydrocodone Hives, Itching      Medication List    STOP taking these medications   Dexilant 60 MG capsule Generic drug: dexlansoprazole   lisinopril-hydrochlorothiazide 20-12.5 MG tablet Commonly known as: ZESTORETIC     TAKE these medications   clonazePAM 1 MG tablet Commonly known as: KLONOPIN Take one by mouth bid What changed:   how much to take  how to take this  when to take this   erythromycin base 500 MG tablet Commonly known as: E-MYCIN Take 1 tablet (500 mg total) by mouth 2 (two) times daily for 10 days.   folic acid 1 MG tablet Commonly known as: FOLVITE Take 1 tablet (1 mg total) by mouth daily. Start taking on: August 26, 2018   furosemide 40 MG tablet Commonly known as: Lasix Take 1 tablet (40 mg total) by mouth 2 (two) times daily.   HYDROmorphone 2 MG tablet Commonly known as: DILAUDID Take 0.5 tablets (1 mg total) by mouth every 6 (six) hours as needed for moderate pain (dyspnea).   lactulose 10 GM/15ML solution Commonly known as: CHRONULAC Take 30 mLs (20 g total) by mouth 2 (two) times daily for 30 days.   midodrine 10 MG tablet Commonly known as: PROAMATINE Take 1 tablet (10 mg total) by mouth 3 (three) times daily with meals.   multivitamin with minerals Tabs tablet Take 1 tablet by mouth daily. Start taking on: August 26, 2018   ondansetron 4 MG tablet Commonly known as: ZOFRAN Take 1 tablet (4 mg total) by mouth every 6 (six) hours as needed for nausea.   pantoprazole 40 MG tablet Commonly known as: PROTONIX Take 1 tablet (40 mg total) by mouth daily. Start taking on: August 26, 2018   thiamine 100 MG  tablet Take 1 tablet (100 mg total) by mouth daily. Start taking on: August 26, 2018       Discharge Assessment: Vitals:   08/24/18 2158 08/25/18 0854  BP: (!) 94/52 (!) 103/50  Pulse: 67 67  Resp: 16 16  Temp: (!) 97.5 F (36.4 C) (!) 97.4 F (36.3 C)  SpO2: 99% 100%   Skin clean, dry and intact without evidence of skin break down, no evidence of skin tears noted. IV catheter discontinued intact. Site without signs and symptoms of complications - no redness or edema noted at insertion site, patient denies c/o pain - only slight tenderness at site.  Dressing with slight pressure applied.  D/c Instructions-Education: Discharge instructions given to patient/family with verbalized understanding. D/c education completed with patient/family including follow up instructions, medication list, d/c activities limitations if indicated, with other d/c instructions as indicated by MD - patient able to verbalize understanding, all questions fully answered. Patient instructed to return to ED, call 911, or call MD for any changes in condition.  Patient escorted via [place], and D/C home via private auto.  Salley Slaughter, RN 08/25/2018 2:19 PM

## 2018-08-27 ENCOUNTER — Telehealth: Payer: Self-pay | Admitting: *Deleted

## 2018-08-27 NOTE — Telephone Encounter (Signed)
Called patient and LMOVM to return call  [place] for [facility] to Discuss results / PCP / recommendations / Schedule patient  Left a detailed voice message to have Trina let us know what the patient needs and Dr. Elease Hashimoto will be happy to help.  CRM Created.

## 2018-08-27 NOTE — Telephone Encounter (Signed)
I am happy to help anyway I can.

## 2018-08-27 NOTE — Telephone Encounter (Signed)
Please see message. °

## 2018-08-27 NOTE — Telephone Encounter (Signed)
Lisabeth Pick with [facility] at [phone]. Pt is leaving hospital today and needs attending of record for hospice care.   Clinic RN is confused as his discharge instructions say he is to f/u with Dr. Antony Blackbird on 7/9.

## 2018-09-05 ENCOUNTER — Telehealth: Payer: Self-pay | Admitting: Family Medicine

## 2018-09-05 MED ORDER — HYDROMORPHONE HCL 2 MG PO TABS: 1.0000 mg | ORAL_TABLET | Freq: Four times a day (QID) | ORAL | 0 refills | Status: AC | PRN

## 2018-09-05 NOTE — Telephone Encounter (Signed)
LVM for Kelle Darting to inform office if liquid form will be needed in the near future to call office.

## 2018-09-05 NOTE — Telephone Encounter (Signed)
I have refilled but do we need to look at liquid form of medication soon?  They can let us know.

## 2018-09-05 NOTE — Telephone Encounter (Signed)
Joseph Valenzuela stated pt actively dying and need his hydromorphone. Sent to [facility]

## 2018-09-06 ENCOUNTER — Telehealth: Payer: Self-pay | Admitting: Family Medicine

## 2018-09-06 ENCOUNTER — Inpatient Hospital Stay: Payer: Medicaid Other | Admitting: Family Medicine

## 2018-09-07 NOTE — Telephone Encounter (Signed)
Noted. Sorry to hear.

## 2018-09-07 NOTE — Telephone Encounter (Unsigned)
Copied from CRM #[id number]. Topic: General - Deceased Patient °>> Sep 06, 2018  6:14 PM Walter, Linda F wrote: °Reason for CRM:      Joseph Valenzuela w/[facility] wanted the provider to know that the patient passed away today, 09/28/2018 at 4:57pm ° °Route to department's PEC Pool. °

## 2018-09-10 ENCOUNTER — Telehealth: Payer: Self-pay | Admitting: Family Medicine

## 2018-09-10 NOTE — Telephone Encounter (Signed)
[employer] came by and dropped off death certificate. Certificate will be in doctor folder in the front. Please fax certificate once filled out and call so original copy could be picked up.

## 2018-09-10 NOTE — Telephone Encounter (Signed)
done

## 2018-09-10 NOTE — Telephone Encounter (Signed)
Please see message. °

## 2018-09-11 NOTE — Telephone Encounter (Signed)
Paperwork handed to [place] at the front desk for completion.

## 2018-09-29 NOTE — Telephone Encounter (Unsigned)
Copied from [employer] [employer] [facility] [id number]. Topic: General - Deceased Patient >> 2018/09/09  6:14 PM Ivar Drape wrote: Reason for CRM:      Joseph Valenzuela w/[facility] wanted the provider to know that the patient passed away today, 2018-09-09 at 4:57pm  Route to department's PEC Pool.

## 2018-09-29 DEATH — deceased

## 2020-09-12 IMAGING — CT CT ABDOMEN AND PELVIS WITHOUT CONTRAST
2 of 5 series · 16 of 46 positions shown, 18 images · non-contrast
Comparison: 06/18/2013

CLINICAL DATA: Painless jaundice, fatigue and abdominal swelling.

EXAM:
CT ABDOMEN AND PELVIS WITHOUT CONTRAST
TECHNIQUE: Multidetector CT imaging of the abdomen and pelvis was performed
following the standard protocol without IV contrast.

[Series 3: ap without · axial · non-contrast · 0.93mm/px · z∈[+805,+1245]mm · 13 of 104 slices shown, 15 images]
[im 8/104  soft-tissue]
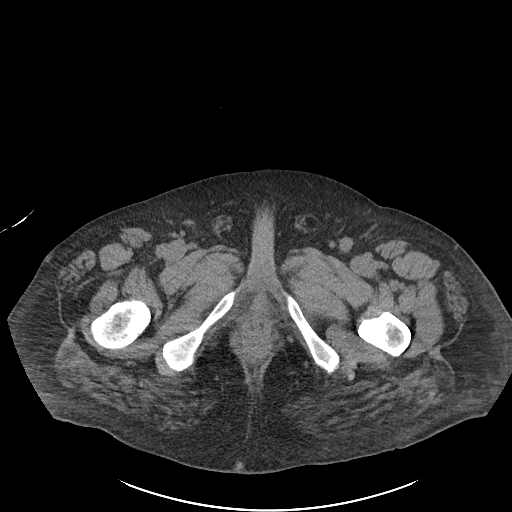
[im 8/104  bone]
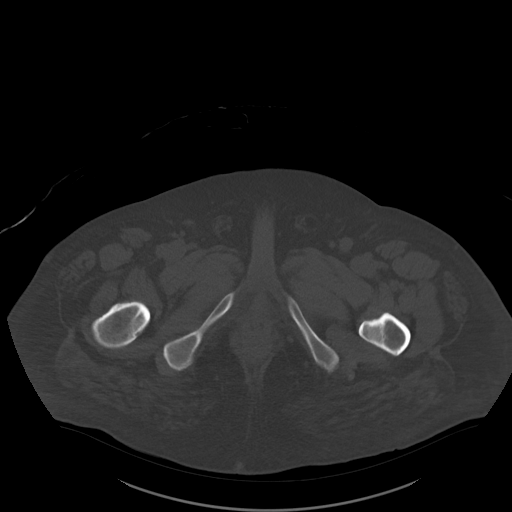
[im 15/104  soft-tissue]
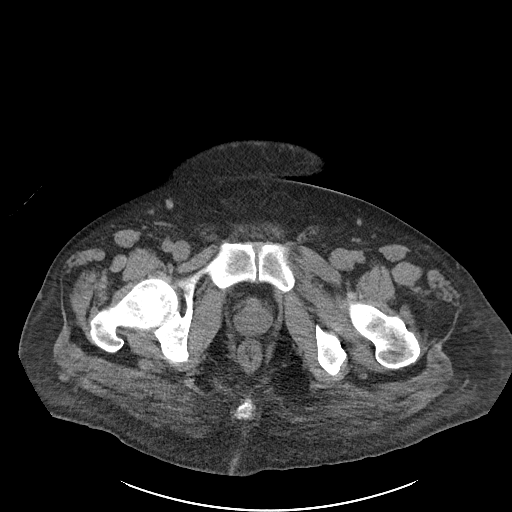
[im 23/104  soft-tissue]
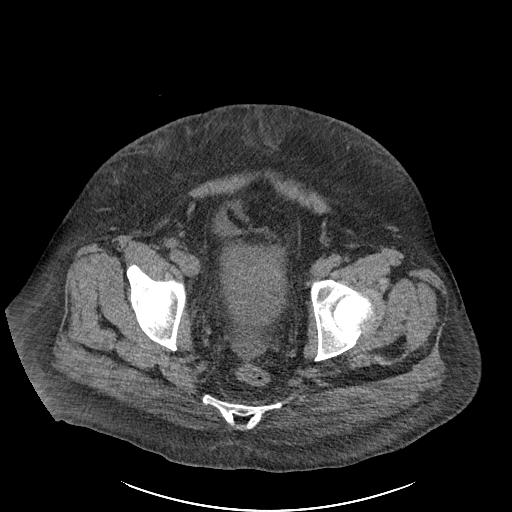
[im 30/104  soft-tissue]
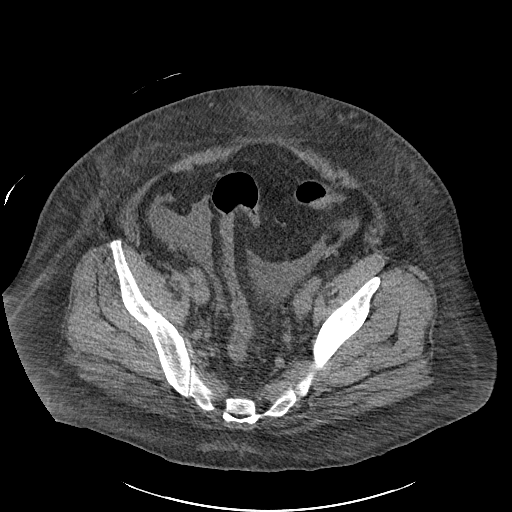
[im 37/104  soft-tissue]
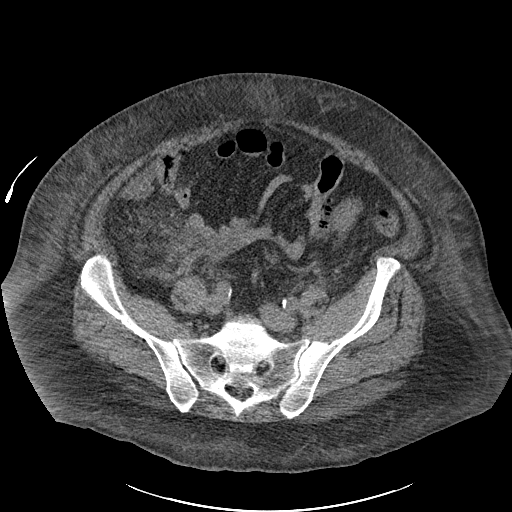
[im 45/104  soft-tissue]
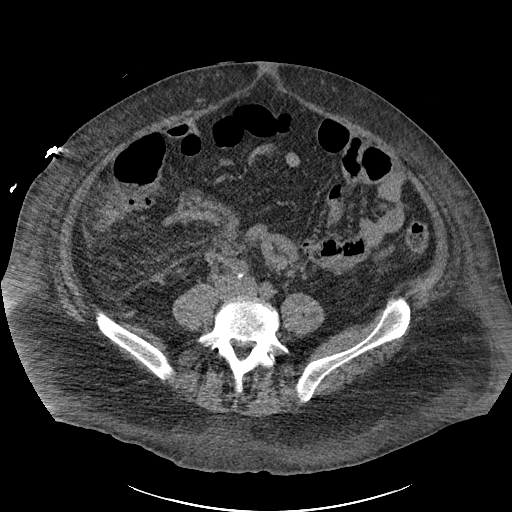
[im 52/104  soft-tissue]
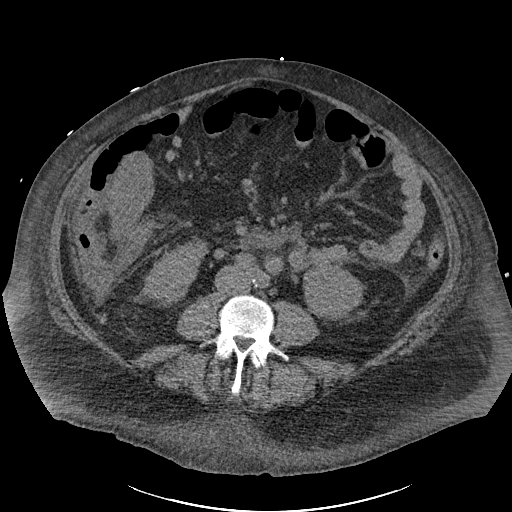
[im 59/104  soft-tissue]
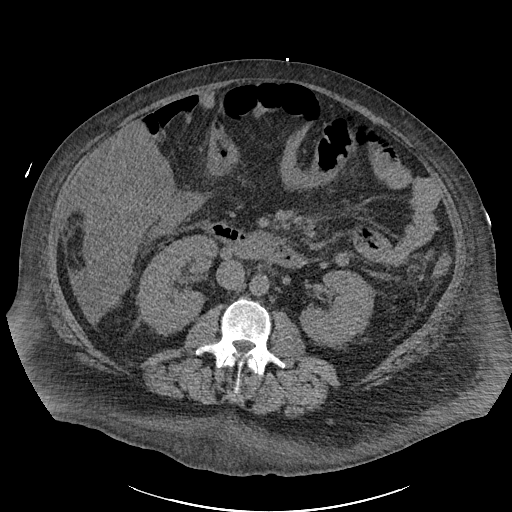
[im 67/104  soft-tissue]
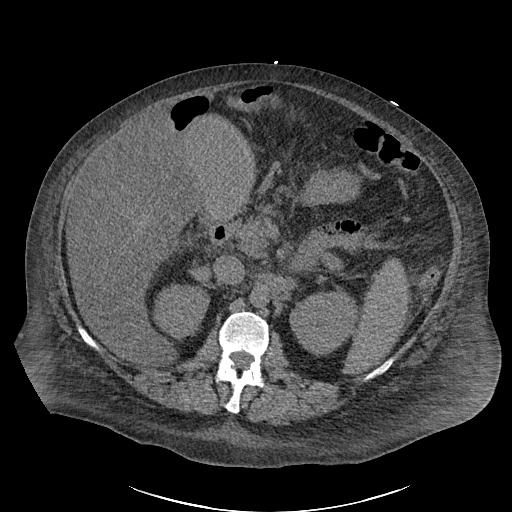
[im 67/104  bone]
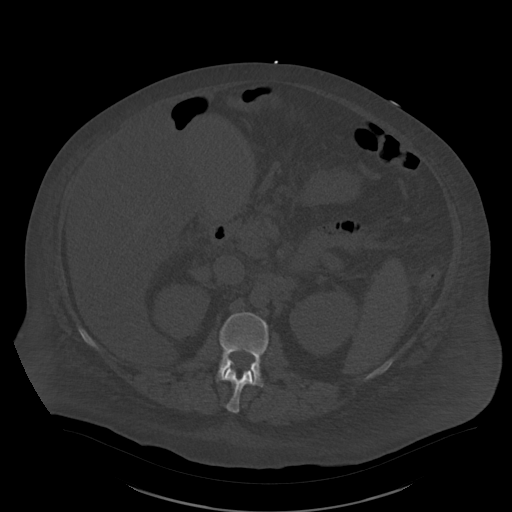
[im 74/104  soft-tissue]
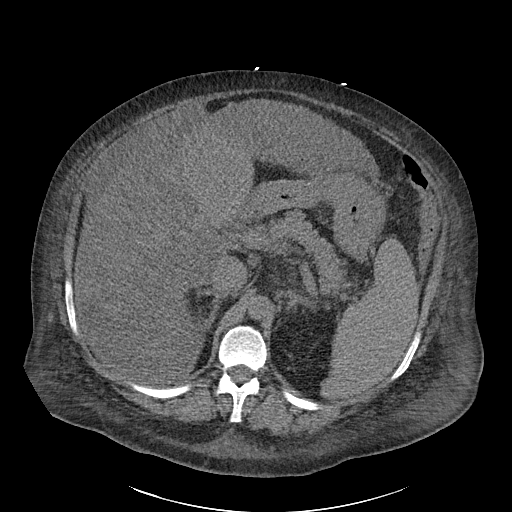
[im 81/104  soft-tissue]
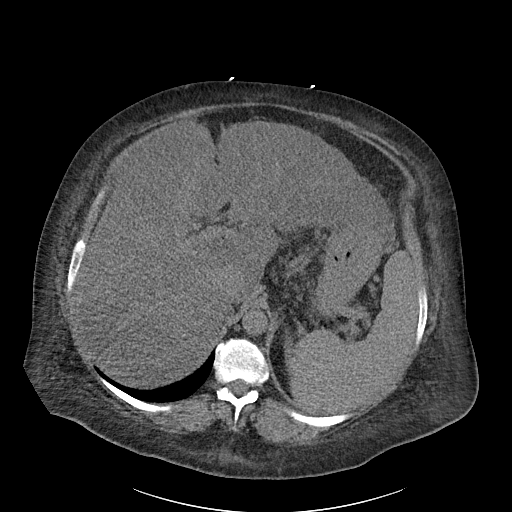
[im 89/104  soft-tissue]
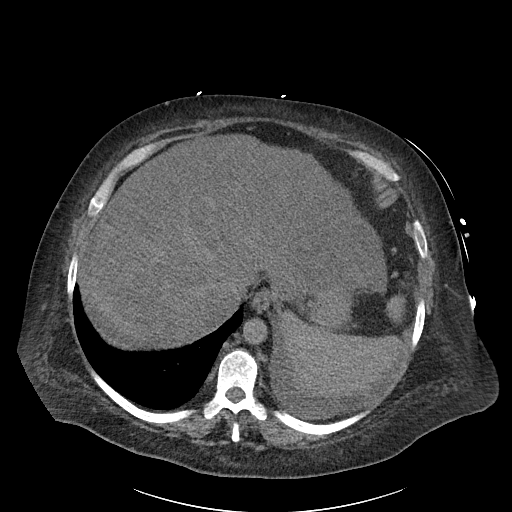
[im 96/104  soft-tissue]
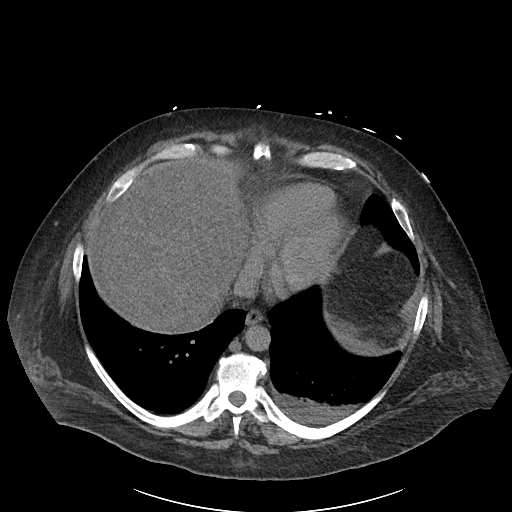

[Series 6: cor · coronal · 1.16mm/px · 3 of 162 slices shown]
[im 54/162  soft-tissue]
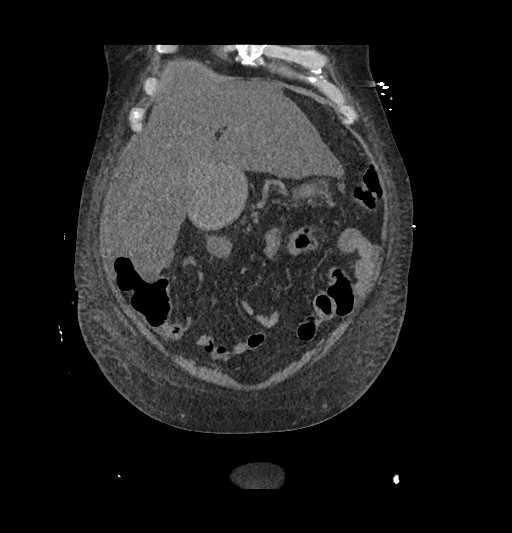
[im 72/162  soft-tissue]
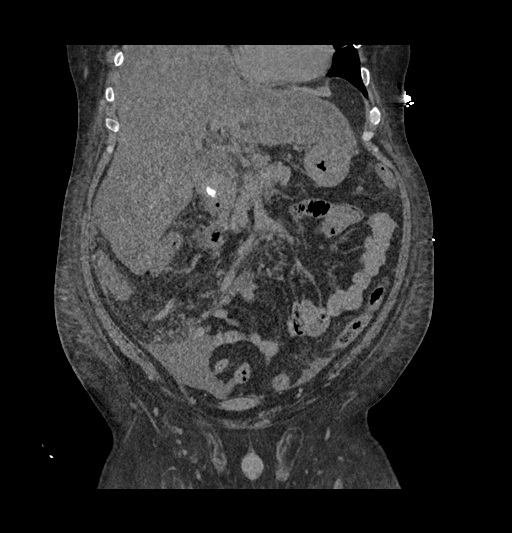
[im 90/162  soft-tissue]
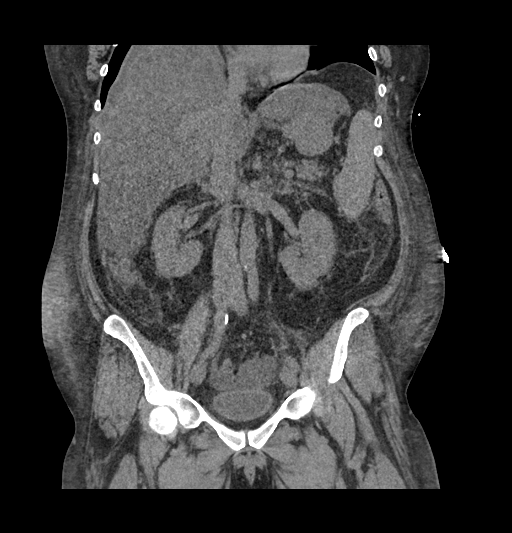

[16 of 46 positions shown; findings below may reference images not displayed]

FINDINGS: Lower chest: Small left pleural effusion noted with overlying
atelectasis. The right lung base is clear. The heart is normal in
size. No pericardial effusion. Marked eventration of the right
hemidiaphragm with overlying atelectasis.

Hepatobiliary: Hepatomegaly. The liver contour is irregular and the
caudate lobe is prominent. The hepatic fissures are also slightly
prominent. Findings consistent with cirrhosis. There is also diffuse
heterogeneous fatty infiltration and possible superimposed
hepatitis. No obvious hepatic lesion. The gallbladder is distended
and demonstrates high attenuation which could be sludge. There also
several small calcified gallstones. The gallbladder wall is
thickened but this could be due to ascites and low albumin. No intra
or extrahepatic biliary dilatation to suggest biliary obstruction.

Pancreas: No mass, inflammation or ductal dilatation.

Spleen: Splenomegaly. Spleen measures 16 x 16 x 12.5 cm. No
worrisome lesions.

Adrenals/Urinary Tract: Adrenal glands and kidneys are unremarkable.
No renal, ureteral or bladder calculi or obvious mass without
contrast.

Stomach/Bowel: The stomach, duodenum, small bowel and colon are
grossly normal without oral contrast. No acute inflammatory changes,
mass lesions or obstructive findings.

Vascular/Lymphatic: Scattered atherosclerotic calcifications
involving the aorta but no aneurysm. There are numerous borderline
upper abdominal lymph nodes typical with cirrhosis. Scattered
retroperitoneal lymph nodes are also noted but no mass or overt
adenopathy.

Reproductive: The prostate gland and seminal vesicles are
unremarkable.

Other: Small volume abdominal/pelvic ascites. There is also diffuse
mesenteric edema and diffuse body wall edema.

Musculoskeletal: No significant bony findings.
IMPRESSION: 1. CT findings consistent with cirrhosis and fatty infiltration of
the liver. No focal hepatic lesions or evidence of biliary
obstruction. There could be superimposed hepatitis.
2. Evidence of portal venous hypertension with portal venous
collaterals, splenomegaly and ascites.
3. Distended gallbladder with high attenuation material which could
suggest sludge. Several gallstones are also noted. Gallbladder wall
thickening likely due to ascites and low albumin.
4. Small left pleural effusion with overlying atelectasis.
5. Diffuse body wall edema.
